# Patient Record
Sex: Female | Born: 1955
Health system: Southern US, Community
[De-identification: ages and names within clinical notes are randomized; demographics above are authoritative.]

## PROBLEM LIST (undated history)

## (undated) DIAGNOSIS — L509 Urticaria, unspecified: Secondary | ICD-10-CM

## (undated) DIAGNOSIS — E119 Type 2 diabetes mellitus without complications: Secondary | ICD-10-CM

## (undated) DIAGNOSIS — K5792 Diverticulitis of intestine, part unspecified, without perforation or abscess without bleeding: Secondary | ICD-10-CM

## (undated) DIAGNOSIS — I10 Essential (primary) hypertension: Secondary | ICD-10-CM

## (undated) DIAGNOSIS — M1990 Unspecified osteoarthritis, unspecified site: Secondary | ICD-10-CM

## (undated) DIAGNOSIS — T7840XA Allergy, unspecified, initial encounter: Secondary | ICD-10-CM

## (undated) HISTORY — PX: CHOLECYSTECTOMY: SHX55

## (undated) HISTORY — DX: Diverticulitis of intestine, part unspecified, without perforation or abscess without bleeding: K57.92

## (undated) HISTORY — PX: APPENDECTOMY: SHX54

## (undated) HISTORY — DX: Urticaria, unspecified: L50.9

## (undated) HISTORY — DX: Allergy, unspecified, initial encounter: T78.40XA

## (undated) HISTORY — DX: Essential (primary) hypertension: I10

## (undated) HISTORY — PX: TUBAL LIGATION: SHX77

## (undated) HISTORY — DX: Type 2 diabetes mellitus without complications: E11.9

## (undated) HISTORY — DX: Unspecified osteoarthritis, unspecified site: M19.90

---

## 2001-02-24 ENCOUNTER — Other Ambulatory Visit: Admission: RE | Admit: 2001-02-24 | Discharge: 2001-02-24 | Payer: Self-pay | Admitting: Obstetrics and Gynecology

## 2001-03-28 ENCOUNTER — Ambulatory Visit (HOSPITAL_COMMUNITY): Admission: RE | Admit: 2001-03-28 | Discharge: 2001-03-28 | Payer: Self-pay | Admitting: Obstetrics and Gynecology

## 2011-10-19 ENCOUNTER — Encounter: Payer: Self-pay | Admitting: Family Medicine

## 2011-10-19 ENCOUNTER — Ambulatory Visit (INDEPENDENT_AMBULATORY_CARE_PROVIDER_SITE_OTHER): Payer: BC Managed Care – PPO | Admitting: Family Medicine

## 2011-10-19 DIAGNOSIS — I10 Essential (primary) hypertension: Secondary | ICD-10-CM

## 2011-10-19 DIAGNOSIS — E1159 Type 2 diabetes mellitus with other circulatory complications: Secondary | ICD-10-CM | POA: Insufficient documentation

## 2011-11-30 ENCOUNTER — Ambulatory Visit: Payer: BC Managed Care – PPO | Admitting: Family Medicine

## 2012-02-29 ENCOUNTER — Ambulatory Visit (INDEPENDENT_AMBULATORY_CARE_PROVIDER_SITE_OTHER): Payer: BC Managed Care – PPO | Admitting: Emergency Medicine

## 2012-02-29 VITALS — BP 150/88 | HR 83 | Temp 99.1°F | Resp 18 | Ht 65.0 in | Wt 246.0 lb

## 2012-02-29 DIAGNOSIS — K047 Periapical abscess without sinus: Secondary | ICD-10-CM

## 2012-02-29 MED ORDER — PENICILLIN V POTASSIUM 500 MG PO TABS: 500.0000 mg | ORAL_TABLET | Freq: Four times a day (QID) | ORAL | Status: AC

## 2012-02-29 MED ORDER — CLINDAMYCIN HCL 150 MG PO CAPS: 150.0000 mg | ORAL_CAPSULE | Freq: Three times a day (TID) | ORAL | Status: AC

## 2012-02-29 NOTE — Progress Notes (Signed)
  Subjective:    Patient ID: Katherine [name], female    DOB: 06/21/1956, 56 y.o.   MRN: [id number]  Hypertension  Dental Injury  This is a chronic problem. The current episode started yesterday. The problem occurs constantly. The problem has been gradually worsening. The pain is at a severity of 4/10. The pain is mild. Associated symptoms include thermal sensitivity. Pertinent negatives include no difficulty swallowing, facial pain, fever, oral bleeding or sinus pressure. She has tried acetaminophen for the symptoms. The treatment provided no relief.      Review of Systems  Constitutional: Negative.  Negative for fever.  HENT: Negative.  Negative for sinus pressure.   Eyes: Negative.   Cardiovascular: Negative.   Gastrointestinal: Negative.   Musculoskeletal: Negative.   Neurological: Negative.        Objective:   Physical Exam  Constitutional: She is oriented to person, place, and time. She appears well-developed and well-nourished.  HENT:  Head: Normocephalic and atraumatic.  Right Ear: External ear normal.  Left Ear: External ear normal.  Mouth/Throat: Dental abscesses and dental caries present.  Cardiovascular: Normal rate, regular rhythm and normal heart sounds.   Abdominal: Soft.  Musculoskeletal: Normal range of motion.  Neurological: She is alert and oriented to person, place, and time.          Assessment & Plan:  Extensive severe carious disease with periapical abscess right maxillary molars

## 2012-02-29 NOTE — Patient Instructions (Signed)
Dental Abscess A dental abscess usually starts from an infected tooth. Antibiotic medicine and pain pills can be helpful, but dental infections require the attention of a dentist. Rinse around the infected area often with salt water (a pinch of salt in 8 oz of warm water). Do not apply heat to the outside of your face. See your dentist or oral surgeon as soon as possible.  SEEK IMMEDIATE MEDICAL CARE IF:  You have increasing, severe pain that is not relieved by medicine.   You or your child has an oral temperature above 102 F (38.9 C), not controlled by medicine.   Your baby is older than 3 months with a rectal temperature of 102 F (38.9 C) or higher.   Your baby is 3 months old or younger with a rectal temperature of 100.4 F (38 C) or higher.   You develop chills, severe headache, difficulty breathing, or trouble swallowing.   You have swelling in the neck or around the eye.  Document Released: 09/14/2005 Document Revised: 09/03/2011 Document Reviewed: 02/23/2007 Exit[employer] Patient Information 2012 [employer]. 

## 2012-03-11 ENCOUNTER — Other Ambulatory Visit: Payer: Self-pay | Admitting: Emergency Medicine

## 2012-06-07 ENCOUNTER — Other Ambulatory Visit: Payer: Self-pay

## 2012-06-07 MED ORDER — LOSARTAN POTASSIUM-HCTZ 50-12.5 MG PO TABS: 1.0000 | ORAL_TABLET | Freq: Every day | ORAL | Status: DC

## 2012-09-22 ENCOUNTER — Telehealth: Payer: Self-pay

## 2012-09-22 MED ORDER — LOSARTAN POTASSIUM-HCTZ 50-12.5 MG PO TABS: 1.0000 | ORAL_TABLET | Freq: Every day | ORAL | Status: DC

## 2012-09-22 NOTE — Telephone Encounter (Signed)
Patient is due for office visit. She is in [place] and is close to a Statistician [facility] [address])_I have put this pharmacy in please advise if we need to send in a few days of her meds. Latishia Suitt

## 2012-09-22 NOTE — Telephone Encounter (Signed)
I have sent in #[phone] but she will need an office visit for any additional refills

## 2012-09-22 NOTE — Telephone Encounter (Signed)
She is advised. She called back,she states the pills look different, she is told we sent in correct Rx (but to call the pharmacy to make sure the correct Rx was dispensed) I pulled paper chart and this is what she was on previously also.

## 2012-09-22 NOTE — Telephone Encounter (Signed)
PT IS OUT OF TOWN AND LEFT HER BP MEDICINE AT HOME.  SHE HAS BEEN TWO DAYS WITHOUT IT AND IT WILL BE TWO MORE DAYS BEFORE SHE GETS BACK HOME TO TAKE IT.  SHE TAKES LOSARTAN-HYDROCHLOROTHIAZIDE.  WILL SHE BE OK WITHOUT THIS FOR THAT PERIOD OF TIME OR SHOULD WE CALL HER IN A FEW UNTIL SHE GETS BACK?  [phone]

## 2012-10-11 ENCOUNTER — Ambulatory Visit (INDEPENDENT_AMBULATORY_CARE_PROVIDER_SITE_OTHER): Payer: BC Managed Care – PPO | Admitting: Family Medicine

## 2012-10-11 VITALS — BP 134/87 | HR 74 | Temp 98.1°F | Resp 18 | Ht 65.0 in | Wt 248.2 lb

## 2012-10-11 DIAGNOSIS — I10 Essential (primary) hypertension: Secondary | ICD-10-CM

## 2012-10-11 MED ORDER — LOSARTAN POTASSIUM-HCTZ 50-12.5 MG PO TABS: 1.0000 | ORAL_TABLET | Freq: Every day | ORAL | Status: DC

## 2012-10-11 NOTE — Progress Notes (Signed)
Katherine [name] is a 57 y.o. female who presents to [facility] today for blood pressure medication refill.  Patient is doing well on losartan/hydrochlorothiazide. She denies any chest pains palpitations fevers or chills.  She denies any cough, or tongue or lip swelling.  She has not yet established with a new primary care provider since Dr. Hal Hope left.  She would like to and set up a well woman exam soon.     PMH: Reviewed hypertension History  Substance Use Topics  . Smoking status: Never Smoker   . Smokeless tobacco: Never Used  . Alcohol Use: Yes     Comment: social   ACE cough ROS as above  Medications reviewed. Current Outpatient Prescriptions  Medication Sig Dispense Refill  . losartan-hydrochlorothiazide (HYZAAR) 50-12.5 MG per tablet Take 1 tablet by mouth daily.  90 tablet  2    Exam:  BP 134/87  Pulse 74  Temp 98.1 F (36.7 C) (Oral)  Resp 18  Ht 5\' 5"  (1.651 m)  Wt 248 lb 3.2 oz (112.583 kg)  BMI 41.30 kg/m2  SpO2 98% Gen: Well NAD HEENT: EOMI,  MMM Lungs: CTABL Nl WOB Heart: RRR no MRG Abd: NABS, NT, ND Exts: Non edematous BL  LE, warm and well perfused.   No results found for this or any previous visit (from the past 72 hour(s)).  Assessment and Plan: 57 y.o. female with well-controlled hypertension.  Plan: Refill losartan and hydrochlorothiazide Check CMP and direct LDL. Patient will followup with Dr. Clelia Croft in a few months for a well woman exam to include health maintenance evaluation at that time.  Discussed warning signs or symptoms. Please see discharge instructions. Patient expresses understanding.

## 2012-10-11 NOTE — Patient Instructions (Addendum)
Thank you for coming in today. We will refill the blood pressure medicine.  We will get labs today.  Make a follow up appointment with Dr. Clelia Croft for a well woman exam soonish.  Call or go to the emergency room if you get worse, have trouble breathing, have chest pains, or palpitations.

## 2012-10-12 LAB — COMPLETE METABOLIC PANEL WITH GFR
ALT: 17 U/L (ref 0–35)
AST: 14 U/L (ref 0–37)
Alkaline Phosphatase: 85 U/L (ref 39–117)
CO2: 30 mEq/L (ref 19–32)
Creat: 0.92 mg/dL (ref 0.50–1.10)
Sodium: 139 mEq/L (ref 135–145)
Total Bilirubin: 0.5 mg/dL (ref 0.3–1.2)
Total Protein: 7.6 g/dL (ref 6.0–8.3)

## 2012-10-24 ENCOUNTER — Ambulatory Visit (INDEPENDENT_AMBULATORY_CARE_PROVIDER_SITE_OTHER): Payer: BC Managed Care – PPO | Admitting: Family Medicine

## 2012-10-24 VITALS — BP 133/83 | HR 80 | Temp 98.2°F | Resp 16 | Ht 65.5 in | Wt 245.0 lb

## 2012-10-24 DIAGNOSIS — H0289 Other specified disorders of eyelid: Secondary | ICD-10-CM

## 2012-10-24 DIAGNOSIS — H0259 Other disorders affecting eyelid function: Secondary | ICD-10-CM

## 2012-10-24 MED ORDER — ERYTHROMYCIN 5 MG/GM OP OINT: TOPICAL_OINTMENT | Freq: Four times a day (QID) | OPHTHALMIC | Status: DC

## 2012-10-24 NOTE — Progress Notes (Signed)
[facility]:  Office Visit  Chief Complaint:  Chief Complaint  Patient presents with  . Stye    left eye    HPI: Katherine [name] is a 57 y.o. female who complains of  6 day history of gradual eye swelling,redness. Denies  allergies, no pain, photophobia, vision changes. Has not tried anything for it. She is a Engineer, site. Does not itch. Has tried to avoid scratching or touching it. No recent URI sxs. No other contacts with similar sxs.    Past Medical History  Diagnosis Date  . Hypertension    Past Surgical History  Procedure Date  . Cholecystectomy    History   Social History  . Marital Status: Married    Spouse Name: N/A    Number of Children: N/A  . Years of Education: N/A   Social History Main Topics  . Smoking status: Never Smoker   . Smokeless tobacco: Never Used  . Alcohol Use: Yes     Comment: social  . Drug Use: No  . Sexually Active: None   Other Topics Concern  . None   Social History Narrative  . None   No family history on file. Allergies  Allergen Reactions  . Ace Inhibitors Cough  . Adhesive (Tape) Rash   Prior to Admission medications   Medication Sig Start Date End Date Taking? Authorizing Provider  losartan-hydrochlorothiazide (HYZAAR) 50-12.5 MG per tablet Take 1 tablet by mouth daily. 10/11/12  Yes Rodolph Bong, MD  phentermine 15 MG capsule Take 15 mg by mouth every morning.   Yes Historical Provider, MD  UNABLE TO FIND HCG: 1 /weekly   Yes Historical Provider, MD  UNABLE TO FIND B 12 injections weekly   Yes Historical Provider, MD     ROS: The patient denies fevers, chills, night sweats, unintentional weight loss, chest pain, palpitations, wheezing, dyspnea on exertion, nausea, vomiting, abdominal pain, dysuria, hematuria, melena, numbness, weakness, or tingling.   All other systems have been reviewed and were otherwise negative with the exception of those mentioned in the HPI and as above.    PHYSICAL  EXAM: Filed Vitals:   10/24/12 1145  BP: 133/83  Pulse: 80  Temp: 98.2 F (36.8 C)  Resp: 16   Filed Vitals:   10/24/12 1145  Height: 5' 5.5" (1.664 m)  Weight: 245 lb (111.131 kg)   Body mass index is 40.15 kg/(m^2).  General: Alert, no acute distress HEENT:  Normocephalic, atraumatic, oropharynx patent. EOMI, PERRLA, fundoscpic exam nl. No appreciable stye/chalazion. + upper lid tenderness, swelling, redness, + abrasion on inner mucosa, upper lid. No erythema of conjunctiva.  Cardiovascular:  Regular rate and rhythm, no rubs murmurs or gallops.  No Carotid bruits, radial pulse intact. No pedal edema.  Respiratory: Clear to auscultation bilaterally.  No wheezes, rales, or rhonchi.  No cyanosis, no use of accessory musculature GI: No organomegaly, abdomen is soft and non-tender, positive bowel sounds.  No masses. Skin: No rashes. Neurologic: Facial musculature symmetric. Psychiatric: Patient is appropriate throughout our interaction. Lymphatic: No cervical lymphadenopathy Musculoskeletal: Gait intact.   LABS:    EKG/XRAY:   Primary read interpreted by Dr. Conley Rolls at [facility].   ASSESSMENT/PLAN: Encounter Diagnosis  Name Primary?  . Superficial swelling of eyelid Yes   Superficial eye lid swelling, there is small abrasion on lateral inner mucosa of upper left lid. There is no e/o stye or chalazion at this time.  Vision is good Erythromycin ointment in left eye and  on left eyelid Warm compresses Monitor for worsening s/sx infection F/u prn     Timothy Trudell PHUONG, DO 10/24/2012 12:33 PM

## 2012-12-07 ENCOUNTER — Ambulatory Visit: Payer: Self-pay

## 2012-12-07 ENCOUNTER — Other Ambulatory Visit: Payer: Self-pay | Admitting: Occupational Medicine

## 2012-12-07 DIAGNOSIS — R52 Pain, unspecified: Secondary | ICD-10-CM

## 2012-12-30 ENCOUNTER — Encounter: Payer: Self-pay | Admitting: Family Medicine

## 2012-12-30 ENCOUNTER — Other Ambulatory Visit: Payer: Self-pay | Admitting: Family Medicine

## 2012-12-30 ENCOUNTER — Ambulatory Visit (INDEPENDENT_AMBULATORY_CARE_PROVIDER_SITE_OTHER): Payer: BC Managed Care – PPO | Admitting: Family Medicine

## 2012-12-30 VITALS — BP 133/77 | HR 75 | Temp 97.5°F | Resp 18 | Ht 65.5 in | Wt 232.0 lb

## 2012-12-30 DIAGNOSIS — I10 Essential (primary) hypertension: Secondary | ICD-10-CM

## 2012-12-30 DIAGNOSIS — Z1231 Encounter for screening mammogram for malignant neoplasm of breast: Secondary | ICD-10-CM

## 2012-12-30 DIAGNOSIS — Z0000 Encounter for general adult medical examination without abnormal findings: Secondary | ICD-10-CM

## 2012-12-30 LAB — COMPREHENSIVE METABOLIC PANEL
BUN: 12 mg/dL (ref 6–23)
CO2: 26 mEq/L (ref 19–32)
Creat: 0.82 mg/dL (ref 0.50–1.10)
Glucose, Bld: 109 mg/dL — ABNORMAL HIGH (ref 70–99)
Total Bilirubin: 0.6 mg/dL (ref 0.3–1.2)
Total Protein: 7.4 g/dL (ref 6.0–8.3)

## 2012-12-30 LAB — CBC WITH DIFFERENTIAL/PLATELET
Basophils Relative: 1 % (ref 0–1)
Eosinophils Absolute: 0.1 10*3/uL (ref 0.0–0.7)
Eosinophils Relative: 3 % (ref 0–5)
MCH: 28.6 pg (ref 26.0–34.0)
MCHC: 35.3 g/dL (ref 30.0–36.0)
MCV: 81.2 fL (ref 78.0–100.0)
Monocytes Relative: 6 % (ref 3–12)
Neutrophils Relative %: 35 % — ABNORMAL LOW (ref 43–77)
Platelets: 247 10*3/uL (ref 150–400)

## 2012-12-30 LAB — LIPID PANEL
Cholesterol: 162 mg/dL (ref 0–200)
HDL: 44 mg/dL (ref >39)
Total CHOL/HDL Ratio: 3.7 Ratio
Triglycerides: 83 mg/dL (ref <150)

## 2012-12-30 LAB — HEPATITIS C ANTIBODY: HCV Ab: NEGATIVE

## 2012-12-30 MED ORDER — LOSARTAN POTASSIUM-HCTZ 100-12.5 MG PO TABS: 1.0000 | ORAL_TABLET | Freq: Every day | ORAL | Status: DC

## 2012-12-30 NOTE — Progress Notes (Signed)
Subjective:    Patient ID: Katherine [name], female    DOB: [birth year], 57 y.o.   MRN: [id number] Chief Complaint  Patient presents with  . Annual Exam  . Gynecologic Exam    HPI  Saw dr. Loreta Ave 5 yrs ago for her colonsocopy - thinks she has some polyps but doesn't remember but has written down in her calender that she is due for a repeat colonoscopy this June.  She will call to sched an appt.   Taking omega-3, fish oil, mvi, vit C, b12, asa, and weight loss supp garcinia cambogia.  Is working very hard on diet and exercise - doing the eliptical regluarly. Has lost 15 lbs in the past 5 mos and decreased bmi from 41 to 38!  Taking tramadol for her shoulder when she hurt her arm at work last wk - she may need to go to PT through workers comp.  Does not remember when last pap was - [facility]  She did have an abnormal one many years ago and all they had to do was repeat it. No colposcopy.  Is married and still sexually active but not often due to schedules and decreased libido.  Checking BP outside the office and running 137-141/77-83 at home.  TDaP was done 2012  Past Medical History  Diagnosis Date  . Hypertension    Current Outpatient Prescriptions on File Prior to Visit  Medication Sig Dispense Refill  . erythromycin ophthalmic ointment Place into the left eye every 6 (six) hours.  3.5 g  0   No current facility-administered medications on file prior to visit.   Allergies  Allergen Reactions  . Ace Inhibitors Cough  . Adhesive (Tape) Rash   Past Surgical History  Procedure Laterality Date  . Cholecystectomy     Family History  Problem Relation Age of Onset  . Diabetes Father   . Hypertension Father   . Sickle cell anemia Brother   . Cancer Maternal Grandmother     breast cancer  . Cancer Brother    History   Social History  . Marital Status: Married    Spouse Name: N/A    Number of Children: N/A  . Years of Education: Masters     Occupational History  . Teacher    Social History Main Topics  . Smoking status: Never Smoker   . Smokeless tobacco: Never Used  . Alcohol Use: Yes     Comment: social  . Drug Use: No  . Sexually Active: None   Other Topics Concern  . None   Social History Narrative   Patient has had 2 pregnancies and has 2 living children.  Her son is 65 yo - he went to [employer], maintenance ([employer]) school and is a Investment banker, operational at [employer] but he is into music as well - rap - and wants to go to [place] to try that.  Her daughter is 16 - she is a Tree surgeon for the [employer] and works from home. Her daughter lives in [place] with 76 yo and 4 mo sons.  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  Review of Systems  All other systems reviewed and are negative.      BP 133/77  Pulse 75  Temp(Src) 97.5 F (36.4 C)  Resp 18  Ht 5' 5.5" (1.664 m)  Wt 232 lb (105.235 kg)  BMI 38.01 kg/m2 Objective:   Physical Exam  Constitutional: She is oriented to person, place, and time. She appears well-developed and well-nourished. No distress.  HENT:  Head: Normocephalic and atraumatic.  Right Ear: Tympanic membrane, external ear and ear canal normal.  Left Ear: Tympanic membrane, external ear and ear canal normal.  Nose: Mucosal edema present. No rhinorrhea.  Mouth/Throat: Uvula is midline, oropharynx is clear and moist and mucous membranes are normal. No posterior oropharyngeal erythema.  Eyes: Conjunctivae and EOM are normal. Pupils are equal, round, and reactive to light. Right eye exhibits no discharge. Left eye exhibits no discharge. No scleral icterus.  Neck: Normal range of motion. Neck supple. No thyromegaly present.  Cardiovascular: Normal rate, regular rhythm, normal heart sounds and intact distal pulses.   Pulmonary/Chest: Effort normal and breath sounds normal. No respiratory distress.  Abdominal: Soft. Bowel sounds are normal.  There is no tenderness.  Genitourinary: Vagina normal and uterus normal. No breast swelling, tenderness, discharge or bleeding. Cervix exhibits no motion tenderness and no friability. Right adnexum displays no mass and no tenderness. Left adnexum displays no mass and no tenderness.  Musculoskeletal: She exhibits no edema.  Lymphadenopathy:    She has no cervical adenopathy.  Neurological: She is alert and oriented to person, place, and time. She has normal reflexes.  Skin: Skin is warm and dry. She is not diaphoretic. No erythema.  Psychiatric: She has a normal mood and affect. Her behavior is normal.      Assessment & Plan:  Routine general medical examination at a health care facility - Plan: MM Digital Screening, TSH, CBC with Differential, HgB A1c, Lipid panel, Comprehensive metabolic panel, Vitamin D 25 hydroxy, Hepatitis C antibody, Pap IG and HPV (high risk) DNA detection - see pt instructions  Hypertension - BP still slightly above goal. Hopefully it will come down with the tlc efforts pt is making but will increase hyzaar from 50/12.5 to 100/12.5.  Recheck in 6 mos.  Severe obesity (BMI >= 40) - will remove from diagnosis list as BMI is down to 38 - keep up the great work!

## 2012-12-30 NOTE — Patient Instructions (Addendum)
Make sure you are getting in plenty of calcium and vitamin D. We want you to get in calcium 500-600mg  twice a day and about 800u of vit D once a day. Look what is in your multivitamin and then buy an additional supplement to round this out that you take at a different time. Consider adding weights to your exercise regimen to help keep your bones strong and help you loose further weight. Congratulations on your weight loss - very impressive. You have actually moved yourself out of the "morbidly obese" diagnosis which is awesome!  Keep up the good work. Remember to call Dr. Loreta Ave and see if you need to schedule your colonoscopy for this yr.  Keeping You Healthy  Get These Tests  Blood Pressure- Have your blood pressure checked by your healthcare provider at least once a year.  Normal blood pressure is 120/80.  Weight- Have your body mass index (BMI) calculated to screen for obesity.  BMI is a measure of body fat based on height and weight.  You can calculate your own BMI at https://www.west-esparza.com/  Cholesterol- Have your cholesterol checked every year.  Diabetes- Have your blood sugar checked every year if you have high blood pressure, high cholesterol, a family history of diabetes or if you are overweight.  Pap Smear- Have a pap smear every 1 to 3 years if you have been sexually active.  If you are older than 65 and recent pap smears have been normal you may not need additional pap smears.  In addition, if you have had a hysterectomy  For benign disease additional pap smears are not necessary.  Mammogram-Yearly mammograms are essential for early detection of breast cancer  Screening for Colon Cancer- Colonoscopy starting at age 59. Screening may begin sooner depending on your family history and other health conditions.  Follow up colonoscopy as directed by your Gastroenterologist.  Screening for Osteoporosis- Screening begins at age 59 with bone density scanning, sooner if you are at higher  risk for developing Osteoporosis.  Get these medicines  Calcium with Vitamin D- Your body requires 5900-1500 mg of Calcium a day and [phone] IU of Vitamin D a day.  You can only absorb 500 mg of Calcium at a time therefore Calcium must be taken in 2 or 3 separate doses throughout the day.  Hormones- Hormone therapy has been associated with increased risk for certain cancers and heart disease.  Talk to your healthcare provider about if you need relief from menopausal symptoms.  Aspirin- Ask your healthcare provider about taking Aspirin to prevent Heart Disease and Stroke.  Get these Immuniztions  Flu shot- Every fall  Pneumonia shot- Once after the age of 59; if you are younger ask your healthcare provider if you need a pneumonia shot.  Tetanus- Every ten years.  Zostavax- Once after the age of 59 to prevent shingles.  Take these steps  Don't smoke- Your healthcare provider can help you quit. For tips on how to quit, ask your healthcare provider or go to www.smokefree.gov or call 1-800 QUIT-NOW.  Be physically active- Exercise 5 days a week for a minimum of 30 minutes.  If you are not already physically active, start slow and gradually work up to 30 minutes of moderate physical activity.  Try walking, dancing, bike riding, swimming, etc.  Eat a healthy diet- Eat a variety of healthy foods such as fruits, vegetables, whole grains, low fat milk, low fat cheeses, yogurt, lean meats, chicken, fish, eggs, dried beans, tofu, etc.  For more information go to www.thenutritionsource.org  Dental visit- Brush and floss teeth twice daily; visit your dentist twice a year.  Eye exam- Visit your Optometrist or Ophthalmologist yearly.  Drink alcohol in moderation- Limit alcohol intake to one drink or less a day.  Never drink and drive.  Depression- Your emotional health is as important as your physical health.  If you're feeling down or losing interest in things you normally enjoy, please talk to  your healthcare provider.  Seat Belts- can save your life; always wear one  Smoke/Carbon Monoxide detectors- These detectors need to be installed on the appropriate level of your home.  Replace batteries at least once a year.  Violence- If anyone is threatening or hurting you, please tell your healthcare provider.  Living Will/ Health care power of attorney- Discuss with your healthcare provider and family.  Exercise to Lose Weight Exercise and a healthy diet may help you lose weight. Your doctor may suggest specific exercises. EXERCISE IDEAS AND TIPS  Choose low-cost things you enjoy doing, such as walking, bicycling, or exercising to workout videos.  Take stairs instead of the elevator.  Walk during your lunch break.  Park your car further away from work or school.  Go to a gym or an exercise class.  Start with 5 to 10 minutes of exercise each day. Build up to 30 minutes of exercise 4 to 6 days a week.  Wear shoes with good support and comfortable clothes.  Stretch before and after working out.  Work out until you breathe harder and your heart beats faster.  Drink extra water when you exercise.  Do not do so much that you hurt yourself, feel dizzy, or get very short of breath. Exercises that burn about 150 calories:  Running 1  miles in 15 minutes.  Playing volleyball for 45 to 60 minutes.  Washing and waxing a car for 45 to 60 minutes.  Playing touch football for 45 minutes.  Walking 1  miles in 35 minutes.  Pushing a stroller 1  miles in 30 minutes.  Playing basketball for 30 minutes.  Raking leaves for 30 minutes.  Bicycling 5 miles in 30 minutes.  Walking 2 miles in 30 minutes.  Dancing for 30 minutes.  Shoveling snow for 15 minutes.  Swimming laps for 20 minutes.  Walking up stairs for 15 minutes.  Bicycling 4 miles in 15 minutes.  Gardening for 30 to 45 minutes.  Jumping rope for 15 minutes.  Washing windows or floors for 45 to 60  minutes. Document Released: 10/17/2010 Document Revised: 12/07/2011 Document Reviewed: 10/17/2010 [facility] Patient Information 2013 [place], [place]. Calorie Counting Diet A calorie counting diet requires you to eat the number of calories that are right for you in a day. Calories are the measurement of how much energy you get from the food you eat. Eating the right amount of calories is important for staying at a healthy weight. If you eat too many calories, your body will store them as fat and you may gain weight. If you eat too few calories, you may lose weight. Counting the number of calories you eat during a day will help you know if you are eating the right amount. A Registered Dietitian can determine how many calories you need in a day. The amount of calories needed varies from person to person. If your goal is to lose weight, you will need to eat fewer calories. Losing weight can benefit you if you are overweight or have health  problems such as heart disease, high blood pressure, or diabetes. If your goal is to gain weight, you will need to eat more calories. Gaining weight may be necessary if you have a certain health problem that causes your body to need more energy. TIPS Whether you are increasing or decreasing the number of calories you eat during a day, it may be hard to get used to changes in what you eat and drink. The following are tips to help you keep track of the number of calories you eat.  Measure foods at home with measuring cups. This helps you know the amount of food and number of calories you are eating.  Restaurants often serve food in amounts that are larger than 1 serving. While eating out, estimate how many servings of a food you are given. For example, a serving of cooked rice is  cup or about the size of half of a fist. Knowing serving sizes will help you be aware of how much food you are eating at restaurants.  Ask for smaller portion sizes or child-size portions at  restaurants.  Plan to eat half of a meal at a restaurant. Take the rest home or share the other half with a friend.  Read the Nutrition Facts panel on food labels for calorie content and serving size. You can find out how many servings are in a package, the size of a serving, and the number of calories each serving has.  For example, a package might contain 3 cookies. The Nutrition Facts panel on that package says that 1 serving is 1 cookie. Below that, it will say there are 3 servings in the container. The calories section of the Nutrition Facts label says there are 90 calories. This means there are 90 calories in 1 cookie (1 serving). If you eat 1 cookie you have eaten 90 calories. If you eat all 3 cookies, you have eaten 270 calories (3 servings x 90 calories = 270 calories). The list below tells you how big or small some common portion sizes are.  1 oz.........4 stacked dice.  3 oz........Marland KitchenDeck of cards.  1 tsp.......Marland KitchenTip of little finger.  1 tbs......Marland KitchenMarland KitchenThumb.  2 tbs.......Marland KitchenGolf ball.   cup......Marland KitchenHalf of a fist.  1 cup.......Marland KitchenA fist. KEEP A FOOD LOG Write down every food item you eat, the amount you eat, and the number of calories in each food you eat during the day. At the end of the day, you can add up the total number of calories you have eaten. It may help to keep a list like the one below. Find out the calorie information by reading the Nutrition Facts panel on food labels. Breakfast  Bran cereal (1 cup, 110 calories).  Fat-free milk ( cup, 45 calories). Snack  Apple (1 medium, 80 calories). Lunch  Spinach (1 cup, 20 calories).  Tomato ( medium, 20 calories).  Chicken breast strips (3 oz, 165 calories).  Shredded cheddar cheese ( cup, 110 calories).  Light [place] dressing (2 tbs, 60 calories).  Whole-wheat bread (1 slice, 80 calories).  Tub margarine (1 tsp, 35 calories).  Vegetable soup (1 cup, 160 calories). Dinner  Pork chop (3 oz, 190  calories).  Brown rice (1 cup, 215 calories).  Steamed broccoli ( cup, 20 calories).  Strawberries (1  cup, 65 calories).  Whipped cream (1 tbs, 50 calories). Daily Calorie Total: 1425 Document Released: 09/14/2005 Document Revised: 12/07/2011 Document Reviewed: 03/11/2007 [facility] Patient Information 2013 [place], [place].

## 2012-12-31 LAB — VITAMIN D 25 HYDROXY (VIT D DEFICIENCY, FRACTURES): Vit D, 25-Hydroxy: 41 ng/mL (ref 30–89)

## 2013-01-03 LAB — PAP IG AND HPV HIGH-RISK: HPV DNA High Risk: NOT DETECTED

## 2013-01-03 LAB — HEMOGLOBIN A1C: Mean Plasma Glucose: 123 mg/dL — ABNORMAL HIGH (ref <117)

## 2013-01-06 ENCOUNTER — Encounter: Payer: Self-pay | Admitting: Sports Medicine

## 2013-01-16 ENCOUNTER — Encounter: Payer: Self-pay | Admitting: *Deleted

## 2013-01-16 ENCOUNTER — Encounter: Payer: Worker's Compensation | Admitting: Sports Medicine

## 2013-01-27 ENCOUNTER — Ambulatory Visit: Payer: Worker's Compensation

## 2013-01-27 ENCOUNTER — Ambulatory Visit (INDEPENDENT_AMBULATORY_CARE_PROVIDER_SITE_OTHER): Payer: Worker's Compensation | Admitting: Family Medicine

## 2013-01-27 VITALS — BP 128/80 | Ht 65.0 in | Wt 220.0 lb

## 2013-01-27 DIAGNOSIS — M25519 Pain in unspecified shoulder: Secondary | ICD-10-CM

## 2013-01-27 DIAGNOSIS — M75101 Unspecified rotator cuff tear or rupture of right shoulder, not specified as traumatic: Secondary | ICD-10-CM

## 2013-01-27 DIAGNOSIS — M75100 Unspecified rotator cuff tear or rupture of unspecified shoulder, not specified as traumatic: Secondary | ICD-10-CM | POA: Insufficient documentation

## 2013-01-27 DIAGNOSIS — M25511 Pain in right shoulder: Secondary | ICD-10-CM

## 2013-01-27 DIAGNOSIS — S43429A Sprain of unspecified rotator cuff capsule, initial encounter: Secondary | ICD-10-CM

## 2013-01-27 NOTE — Progress Notes (Signed)
  Subjective:    Patient ID: Katherine [name], female    DOB: 03-Aug-1956, 57 y.o.   MRN: [id number]  HPI Worker's compensation number [id number] Date of injury December 01, 2012. Patient was at work walking down a flight of stairs with her right hand on the banister. She started to make the turn for the second flight of stairs her foot slipped on the stair and she almost fell, grabbing the banister with her right hand to steady herself. She felt an immediate sharp pain in her right shoulder. Since then she's had continued pain, particularly at night and with certain motions. Overhead reaching or trying to pick up something her most painful. She is right-hand dominant. She's had no numbness or tingling in her hands PERTINENT  PMH / PSH: No prior shoulder injury, no history of shoulder surgeries, no neck surgeries. ALLERGY: Pertinent is that she is allergic to most adhesives, Band-Aids in that kind of tape.  Review of Systems See history of present illness. Additionally she has had no redness or warmth of the right shoulder noted.    Objective:   Physical Exam Vital signs are reviewed GENERAL: Well-developed female no acute distress SHOULDERS: Bilaterally they're symmetrical without any droop. RIGHT shoulder. Full passive range of motion. Active range of motion is limited to elevation in the forward and lateral planes at about 90. She has weakness with supraspinatus and subscapularis testing. Distally she is neurovascularly intact. The deltoid is nontender. The bicipital tendon is nontender. The biceps is without defect. ; Small intrasubstance tear x2 in the supraspinatus tendon. The subscapularis tendon reveals a small articular surface tear. Neither of these is full-thickness. The biceps tendon appears intact but there is fluid in the sheath consistent with bicipital tendinitis.    INJECTION: Patient was given informed consent, signed copy in the chart. Appropriate time out was taken. Area  prepped and draped in usual sterile fashion. One cc of methylprednisolone 40 mg/ml plus  4 cc of 1% lidocaine without epinephrine was injected into the right subacromial bursa using a(n) posterior approach. The patient tolerated the procedure well. There were no complications. Post procedure instructions were given.      Assessment & Plan:  Rotator cuff tear, not full-thickness. Both supraspinatus and subscapularis are involved. We discussed options. I think the nitroglycerin patches Option for her given her adhesive allergies. Today we decided to go ahead and see if we can improve her pain with a corticosteroid injection. She will start an aggressive home rehabilitation program and I will see her back in 4 weeks.

## 2013-01-30 ENCOUNTER — Ambulatory Visit: Payer: Worker's Compensation

## 2013-01-30 ENCOUNTER — Ambulatory Visit
Admission: RE | Admit: 2013-01-30 | Discharge: 2013-01-30 | Disposition: A | Discharge status: Home / Self Care | Payer: Self-pay | Source: Ambulatory Visit | Attending: Family Medicine | Admitting: Family Medicine

## 2013-01-30 DIAGNOSIS — Z1231 Encounter for screening mammogram for malignant neoplasm of breast: Secondary | ICD-10-CM

## 2013-02-10 ENCOUNTER — Ambulatory Visit: Payer: Worker's Compensation

## 2013-02-27 ENCOUNTER — Encounter: Payer: Worker's Compensation | Admitting: Family Medicine

## 2013-03-06 ENCOUNTER — Encounter: Payer: Self-pay | Admitting: Family Medicine

## 2013-03-06 ENCOUNTER — Ambulatory Visit (INDEPENDENT_AMBULATORY_CARE_PROVIDER_SITE_OTHER): Payer: Worker's Compensation | Admitting: Family Medicine

## 2013-03-06 VITALS — BP 126/85 | HR 88 | Ht 65.0 in | Wt 220.0 lb

## 2013-03-06 DIAGNOSIS — M75101 Unspecified rotator cuff tear or rupture of right shoulder, not specified as traumatic: Secondary | ICD-10-CM

## 2013-03-06 DIAGNOSIS — S43429A Sprain of unspecified rotator cuff capsule, initial encounter: Secondary | ICD-10-CM

## 2013-03-06 MED ORDER — TRAMADOL HCL 50 MG PO TABS: 50.0000 mg | ORAL_TABLET | Freq: Three times a day (TID) | ORAL | Status: DC | PRN

## 2013-03-07 NOTE — Progress Notes (Signed)
  Subjective:    Patient ID: Katherine [name], female    DOB: 20-May-1956, 57 y.o.   MRN: [id number]  HPI Worker's compensation number [id number]  Date of injury December 01, 2012.  Patient has now retired but this is continuing workers compensation case  Followup right shoulder injury. She has been doing the home exercise program daily and has seen about 10% improvement in her range of motion and in her pain. Pain is still most problematic at night causing difficulty with initiating sleep. She is intermittently used over-the-counter medications. They have given her some tramadol initially that seemed to help but she ran out of that.  notes from initial visit are reviewed: Patient was at work walking down a flight of stairs with her right hand on the banister. She started to make the turn for the second flight of stairs her foot slipped on the stair and she almost fell, grabbing the banister with her right hand to steady herself. She felt an immediate sharp pain in her right shoulder. Since then she's had continued pain, particularly at night and with certain motions. Overhead reaching or trying to pick up something her most painful. She is right-hand dominant. She's had no numbness or tingling in her hands  PERTINENT PMH / PSH:  No prior shoulder injury, no history of shoulder surgeries, no neck surgeries   Review of Systems No fever, sweats, chills, unusual weight change. No new numbness or tingling in the right hand. No new weakness in the right upper extremity.    Objective:   Physical Exam  Vital signs are reviewed GENERAL: Well-developed female no acute distress SHOULDERS: Bilaterally they're symmetrical without any droop.  RIGHT shoulder. Full passive range of motion. Active range of motion is limited to elevation in the forward and lateral planes at about 90 secondary to pain.. She has weakness with supraspinatus and subscapularis testing most likely with weakness related to pain  although this is difficult to interpret.. Distally she is neurovascularly intact. The deltoid is nontender. The bicipital tendon is nontender.       Assessment & Plan:  Rotator cuff tear. We saw this at last office visit on ultrasound. She has been diligent in her home exercise program and has made very little progress. They get this point need to further evaluate the rotator cuff and underlying glenoid and we'll set her up for MRI. In the interim I would continue the home exercise program. For pain I will give her another or scription of the tramadol to be used sparingly.

## 2013-04-17 ENCOUNTER — Telehealth: Payer: Self-pay | Admitting: *Deleted

## 2013-04-17 DIAGNOSIS — M25511 Pain in right shoulder: Secondary | ICD-10-CM

## 2013-04-17 NOTE — Telephone Encounter (Signed)
Received MRI results-pt has a full thickness rotator cuff tear.  Per Dr. Jennette Kettle scheduled pt for appt at [facility] with Dr. Dion Saucier on 04/19/13 at 3:15pm.  Approved by Tiburcio Bash at [place].  Faxed worker's comp info to [facility].  Pt notified of appt.

## 2013-04-17 NOTE — Telephone Encounter (Signed)
Message copied by Mora Bellman on Mon Apr 17, 2013  5:03 PM ------      Message from: CERESI, MELANIE L      Created: Mon Apr 17, 2013 12:11 PM      Regarding: mri results      Contact: [phone]       Rt shoulder mri results ------

## 2013-06-23 ENCOUNTER — Other Ambulatory Visit: Payer: Self-pay | Admitting: Family Medicine

## 2013-07-31 ENCOUNTER — Telehealth: Payer: Self-pay

## 2013-07-31 NOTE — Telephone Encounter (Signed)
Ok to take biotin.  The recommended daily intake is only - some supplements have doses as high as 10,029mcg, which is probably not harmful, but also probably not helpful.  A lower dose, like , is likely more than enough.  It is taken once daily

## 2013-07-31 NOTE — Telephone Encounter (Signed)
Patient advised.

## 2013-07-31 NOTE — Telephone Encounter (Signed)
Please advise 

## 2013-07-31 NOTE — Telephone Encounter (Signed)
Pt  Needs to know if it is ok to take Biotin and if so how much?   Best phone for pt is [phone]

## 2013-08-18 ENCOUNTER — Ambulatory Visit (INDEPENDENT_AMBULATORY_CARE_PROVIDER_SITE_OTHER): Payer: BC Managed Care – PPO | Admitting: Family Medicine

## 2013-08-18 VITALS — BP 118/72 | HR 102 | Temp 98.9°F | Resp 18 | Ht 66.0 in | Wt 239.0 lb

## 2013-08-18 DIAGNOSIS — J029 Acute pharyngitis, unspecified: Secondary | ICD-10-CM

## 2013-08-18 DIAGNOSIS — J020 Streptococcal pharyngitis: Secondary | ICD-10-CM

## 2013-08-18 LAB — POCT RAPID STREP A (OFFICE): Rapid Strep A Screen: POSITIVE — AB

## 2013-08-18 MED ORDER — PENICILLIN V POTASSIUM 500 MG PO TABS: 500.0000 mg | ORAL_TABLET | Freq: Two times a day (BID) | ORAL | Status: DC

## 2013-08-18 NOTE — Progress Notes (Signed)
[facility] [address] [place] [address] [phone]  Date:  08/18/2013   Name:  Katherine [name]   DOB:  November 23, 1955   MRN:  [id number]  PCP:  [name], MD    Chief Complaint: swollen glands, sore throat, headache   History of Present Illness:  Katherine [name] is a 57 y.o. very pleasant female patient who presents with the following:  She is here today with illness.  She has noted a ST, swollen glands in her neck, headache and lower back pain for about 3 days.    She did have a fever for the last copule of days up to 101.  She has noted chills.   She has not been able to eat much due to her ST.   No nasal symptoms.  She is coughing up some mucus but otherwise does not have a severe cough.   No antipyretics today.   No dysuria, no hematuria.   She is around her grandson a fair amount.  He is 42 year old.  She is not sure if he might have been ill recently   Patient Active Problem List   Diagnosis Date Noted  . Rotator cuff tear 01/27/2013  . Hypertension     Past Medical History  Diagnosis Date  . Hypertension     Past Surgical History  Procedure Laterality Date  . Cholecystectomy      History  Substance Use Topics  . Smoking status: Never Smoker   . Smokeless tobacco: Never Used  . Alcohol Use: Yes     Comment: social    Family History  Problem Relation Age of Onset  . Diabetes Father   . Hypertension Father   . Sickle cell anemia Brother   . Cancer Maternal Grandmother     breast cancer  . Cancer Brother     Allergies  Allergen Reactions  . Ace Inhibitors Cough  . Adhesive [Tape] Rash    Medication list has been reviewed and updated.  Current Outpatient Prescriptions on File Prior to Visit  Medication Sig Dispense Refill  . losartan-hydrochlorothiazide (HYZAAR) 100-12.5 MG per tablet TAKE ONE TABLET BY MOUTH ONCE DAILY  90 tablet  0  . erythromycin ophthalmic ointment Place into the left eye every 6 (six) hours.   3.5 g  0  . fish oil-omega-3 fatty acids 1000 MG capsule Take 2 g by mouth daily.      . traMADol (ULTRAM) 50 MG tablet Take 1 tablet (50 mg total) by mouth every 8 (eight) hours as needed for pain.  90 tablet  1  . vitamin B-12 (CYANOCOBALAMIN) 500 MCG tablet Take 500 mcg by mouth daily.       No current facility-administered medications on file prior to visit.    Review of Systems:  As per HPI- otherwise negative.   Physical Examination: Filed Vitals:   08/18/13 0827  BP: 118/72  Pulse: 111  Temp: 98.9 F (37.2 C)  Resp: 18   Filed Vitals:   08/18/13 0827  Height: 5\' 6"  (1.676 m)  Weight: 239 lb (108.41 kg)   Body mass index is 38.59 kg/(m^2). Ideal Body Weight: Weight in (lb) to have BMI = 25: 154.6  GEN: WDWN, NAD, Non-toxic, A & O x 3, obese HEENT: Atraumatic, Normocephalic. Neck supple. No masses, No LAD.  Bilateral TM wnl, oropharynx shows erythematous tonsils bilaterally with exudate.  PEERL,EOMI.   Ears and Nose: No external deformity. CV: RRR, No M/G/R. No  JVD. No thrill. No extra heart sounds. PULM: CTA B, no wheezes, crackles, rhonchi. No retractions. No resp. distress. No accessory muscle use. No CVA tenderness EXTR: No c/c/e NEURO Normal gait.  PSYCH: Normally interactive. Conversant. Not depressed or anxious appearing.  Calm demeanor.   Results for orders placed in visit on 08/18/13  POCT RAPID STREP A (OFFICE)      Result Value Range   Rapid Strep A Screen Positive (*) Negative    Assessment and Plan: Streptococcal sore throat - Plan: penicillin v potassium (VEETID) 500 MG tablet  Acute pharyngitis - Plan: POCT rapid strep A  Strep throat.  Treat with penicillin for 10 days.  She will speak with her daughter to ensure her grandson is ok.   See patient instructions for more details.     Signed Abbe Amsterdam, MD

## 2013-08-18 NOTE — Patient Instructions (Signed)
You have strep throat.  The penicillin should get you better within 1 or 2 days.  Let me know if you are not feeling better- Sooner if worse.   Drink plenty of fluids and use medications as needed for pain and fever.

## 2013-08-22 ENCOUNTER — Telehealth: Payer: Self-pay

## 2013-08-22 MED ORDER — BENZONATATE 100 MG PO CAPS: 100.0000 mg | ORAL_CAPSULE | Freq: Three times a day (TID) | ORAL | Status: DC | PRN

## 2013-08-22 MED ORDER — GUAIFENESIN ER 1200 MG PO TB12: 1.0000 | ORAL_TABLET | Freq: Two times a day (BID) | ORAL | Status: DC | PRN

## 2013-08-22 NOTE — Telephone Encounter (Signed)
Spoke with pt advised Rx's sent in to her pharmacy.

## 2013-08-22 NOTE — Telephone Encounter (Signed)
Pt states called this morning, however there does not appear to be a previous message. Pt states was seen on 08/18/13, sore throat is better, but cough and chest congestion is worse and is coughing up "green stuff". Pt wants to know if we can call something in for the cough and chest congestion to the [facility] on [address]

## 2013-08-22 NOTE — Telephone Encounter (Signed)
Meds ordered this encounter  Medications  . benzonatate (TESSALON) 100 MG capsule    Sig: Take 1-2 capsules (100-200 mg total) by mouth 3 (three) times daily as needed for cough.    Dispense:  40 capsule    Refill:  0    Order Specific Question:  Supervising Provider    Answer:  DOOLITTLE, ROBERT P [[id number]]  . Guaifenesin (MUCINEX MAXIMUM STRENGTH) 1200 MG TB12    Sig: Take 1 tablet (1,200 mg total) by mouth every 12 (twelve) hours as needed.    Dispense:  14 tablet    Refill:  1    Order Specific Question:  Supervising Provider    Answer:  DOOLITTLE, ROBERT P [[id number]]

## 2013-09-15 ENCOUNTER — Ambulatory Visit (INDEPENDENT_AMBULATORY_CARE_PROVIDER_SITE_OTHER): Payer: BC Managed Care – PPO | Admitting: Family Medicine

## 2013-09-15 ENCOUNTER — Ambulatory Visit: Payer: BC Managed Care – PPO

## 2013-09-15 VITALS — BP 125/81 | HR 91 | Temp 97.7°F | Resp 16 | Ht 66.0 in | Wt 241.0 lb

## 2013-09-15 DIAGNOSIS — R05 Cough: Secondary | ICD-10-CM

## 2013-09-15 DIAGNOSIS — S134XXA Sprain of ligaments of cervical spine, initial encounter: Secondary | ICD-10-CM

## 2013-09-15 DIAGNOSIS — S8992XA Unspecified injury of left lower leg, initial encounter: Secondary | ICD-10-CM

## 2013-09-15 DIAGNOSIS — M542 Cervicalgia: Secondary | ICD-10-CM

## 2013-09-15 DIAGNOSIS — M75101 Unspecified rotator cuff tear or rupture of right shoulder, not specified as traumatic: Secondary | ICD-10-CM

## 2013-09-15 DIAGNOSIS — S43429A Sprain of unspecified rotator cuff capsule, initial encounter: Secondary | ICD-10-CM

## 2013-09-15 DIAGNOSIS — S8990XA Unspecified injury of unspecified lower leg, initial encounter: Secondary | ICD-10-CM

## 2013-09-15 MED ORDER — MELOXICAM 15 MG PO TABS: 15.0000 mg | ORAL_TABLET | Freq: Every day | ORAL | Status: DC

## 2013-09-15 MED ORDER — TRAMADOL HCL 50 MG PO TABS: 50.0000 mg | ORAL_TABLET | Freq: Three times a day (TID) | ORAL | Status: DC | PRN

## 2013-09-15 MED ORDER — BENZONATATE 100 MG PO CAPS: 100.0000 mg | ORAL_CAPSULE | Freq: Three times a day (TID) | ORAL | Status: DC | PRN

## 2013-09-15 MED ORDER — METHOCARBAMOL 500 MG PO TABS: 500.0000 mg | ORAL_TABLET | Freq: Four times a day (QID) | ORAL | Status: DC
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
## 2013-09-19 ENCOUNTER — Ambulatory Visit (INDEPENDENT_AMBULATORY_CARE_PROVIDER_SITE_OTHER): Payer: BC Managed Care – PPO | Admitting: Family Medicine

## 2013-09-19 ENCOUNTER — Telehealth: Payer: Self-pay

## 2013-09-19 VITALS — BP 130/86 | HR 72 | Temp 98.5°F | Resp 16 | Ht 65.5 in | Wt 243.0 lb

## 2013-09-19 DIAGNOSIS — M79662 Pain in left lower leg: Secondary | ICD-10-CM

## 2013-09-19 DIAGNOSIS — T148XXA Other injury of unspecified body region, initial encounter: Secondary | ICD-10-CM

## 2013-09-19 DIAGNOSIS — M899 Disorder of bone, unspecified: Secondary | ICD-10-CM

## 2013-09-19 MED ORDER — OXYCODONE-ACETAMINOPHEN 5-325 MG PO TABS: 1.0000 | ORAL_TABLET | Freq: Three times a day (TID) | ORAL | Status: DC | PRN

## 2013-09-19 NOTE — Telephone Encounter (Signed)
PT STATES SHE HAD CALLED YESTERDAY REGARDING THE PAIN IN HER KNEE WHICH HAS GOTTEN SEVERE ESPECIALLY THROUGH THE NIGHT, HASN'T BEEN ABLE TO SLEEP PLEASE CALL [phone]    [facility] ON [address]

## 2013-09-19 NOTE — Telephone Encounter (Signed)
Plan is : Knee injuries, left, initial encounter - Plan: DG Knee Complete 4 Views Left - poss meniscal injury due to medial joint line tenderness but exam otherwise nml. Rec watchful waiting, cont ice. Prn tramadol. If cont, RTC and cons PT, MRI, vs referral back to Dr. Dion Saucier. Called her, advised RTC. Did not get message yesterday.
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
## 2013-09-19 NOTE — Patient Instructions (Signed)
Bone Bruise  °A bone bruise is a small hidden fracture of the bone. It typically occurs with bones located close to the surface of the skin.  °SYMPTOMS °· The pain lasts longer than a normal bruise. °· The bruised area is difficult to use. °· There may be discoloration or swelling of the bruised area. °· When a bone bruise is found with injury to the anterior cruciate ligament (in the knee) there is often an increased: °· Amount of fluid in the knee °· Time the fluid in the knee lasts. °· Number of days until you are walking normally and regaining the motion you had before the injury. °· Number of days with pain from the injury. °DIAGNOSIS  °It can only be seen on X-rays known as MRIs. This stands for magnetic resonance imaging. A regular X-ray taken of a bone bruise would appear to be normal. A bone bruise is a common injury in the knee and the heel bone (calcaneus). The problems are similar to those produced by stress fractures, which are bone injuries caused by overuse. A bone bruise may also be a sign of other injuries. For example, bone bruises are commonly found where an anterior cruciate ligament (ACL) in the knee has been pulled away from the bone (ruptured). A ligament is a tough fibrous material that connects bones together to make our joints stable. Bruises of the bone last a lot longer than bruises of the muscle or tissues beneath the skin. Bone bruises can last from days to months and are often more severe and painful than other bruises. °TREATMENT °Because bone bruises are sudden injuries you cannot often prevent them, other than by being extremely careful. Some things you can do to improve the condition are: °· Apply ice to the sore area for 15-20 minutes, 03-04 times per day while awake for the first 2 days. Put the ice in a plastic bag, and place a towel between the bag of ice and your skin. °· Keep your bruised area raised (elevated) when possible to lessen swelling. °· For activity: °· Use  crutches when necessary; do not put weight on the injured leg until you are no longer tender. °· You may walk on your affected part as the pain allows, or as instructed. °· Start weight bearing gradually on the bruised part. °· Continue to use crutches or a cane until you can stand without causing pain, or as instructed. °· If a plaster splint was applied, wear the splint until you are seen for a follow-up examination. Rest it on nothing harder than a pillow the first 24 hours. Do not put weight on it. Do not get it wet. You may take it off to take a shower or bath. °· If an air splint was applied, more air may be blown into or out of the splint as needed for comfort. You may take it off at night and to take a shower or bath. °· Wiggle your toes in the splint several times per day if you are able. °· You may have been given an elastic bandage to use with the plaster splint or alone. The splint is too tight if you have numbness, tingling or if your foot becomes cold and blue. Adjust the bandage to make it comfortable. °· Only take over-the-counter or prescription medicines for pain, discomfort, or fever as directed by your caregiver. °· Follow all instructions for follow up with your caregiver. This includes any orthopedic referrals, physical therapy, and rehabilitation. Any delay in   obtaining necessary care could result in a delay or failure of the bones to heal. °SEEK MEDICAL CARE IF:  °· You have an increase in bruising, swelling, or pain. °· You notice coldness of your toes. °· You do not get pain relief with medications. °SEEK IMMEDIATE MEDICAL CARE IF:  °· Your toes are numb or blue. °· You have severe pain not controlled with medications. °· If any of the problems that caused you to seek care are becoming worse. °Document Released: 12/05/2003 Document Revised: 12/07/2011 Document Reviewed: 04/26/2013 °[employer]® Patient Information ©2014 [employer]. ° °

## 2013-10-01 ENCOUNTER — Other Ambulatory Visit: Payer: Self-pay | Admitting: Physician Assistant

## 2013-10-20 ENCOUNTER — Ambulatory Visit: Payer: BC Managed Care – PPO | Admitting: Family Medicine

## 2013-10-20 ENCOUNTER — Ambulatory Visit (INDEPENDENT_AMBULATORY_CARE_PROVIDER_SITE_OTHER): Payer: BC Managed Care – PPO | Admitting: Family Medicine

## 2013-10-20 VITALS — BP 130/81 | HR 85 | Temp 98.2°F | Resp 16 | Ht 66.0 in | Wt 245.0 lb

## 2013-10-20 DIAGNOSIS — Z79899 Other long term (current) drug therapy: Secondary | ICD-10-CM

## 2013-10-20 DIAGNOSIS — E669 Obesity, unspecified: Secondary | ICD-10-CM

## 2013-10-20 DIAGNOSIS — S99919A Unspecified injury of unspecified ankle, initial encounter: Secondary | ICD-10-CM

## 2013-10-20 DIAGNOSIS — R059 Cough, unspecified: Secondary | ICD-10-CM

## 2013-10-20 DIAGNOSIS — R7309 Other abnormal glucose: Secondary | ICD-10-CM

## 2013-10-20 DIAGNOSIS — S8990XA Unspecified injury of unspecified lower leg, initial encounter: Secondary | ICD-10-CM

## 2013-10-20 DIAGNOSIS — R7303 Prediabetes: Secondary | ICD-10-CM | POA: Insufficient documentation

## 2013-10-20 DIAGNOSIS — S99929A Unspecified injury of unspecified foot, initial encounter: Secondary | ICD-10-CM

## 2013-10-20 DIAGNOSIS — S8992XA Unspecified injury of left lower leg, initial encounter: Secondary | ICD-10-CM

## 2013-10-20 DIAGNOSIS — R635 Abnormal weight gain: Secondary | ICD-10-CM

## 2013-10-20 DIAGNOSIS — I10 Essential (primary) hypertension: Secondary | ICD-10-CM

## 2013-10-20 DIAGNOSIS — R05 Cough: Secondary | ICD-10-CM

## 2013-10-20 LAB — POCT GLYCOSYLATED HEMOGLOBIN (HGB A1C): HEMOGLOBIN A1C: 6.1

## 2013-10-20 LAB — GLUCOSE, POCT (MANUAL RESULT ENTRY): POC GLUCOSE: 130 mg/dL — AB (ref 70–99)

## 2013-10-20 MED ORDER — BENZONATATE 100 MG PO CAPS: 100.0000 mg | ORAL_CAPSULE | Freq: Three times a day (TID) | ORAL | Status: DC | PRN

## 2013-10-20 MED ORDER — MELOXICAM 15 MG PO TABS
15.0000 mg | ORAL_TABLET | Freq: Every day | ORAL | Status: DC
Start: 2013-10-20 — End: 2013-12-14

## 2013-10-20 MED ORDER — LOSARTAN POTASSIUM-HCTZ 100-12.5 MG PO TABS: 1.0000 | ORAL_TABLET | Freq: Every day | ORAL | Status: DC

## 2013-10-20 MED ORDER — HYDROCODONE-ACETAMINOPHEN 5-325 MG PO TABS: 1.0000 | ORAL_TABLET | Freq: Four times a day (QID) | ORAL | Status: DC | PRN
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
## 2013-10-20 NOTE — Patient Instructions (Signed)
Diabetes, Type 2, Am I At Risk? Diabetes is a lasting (chronic) disease. In type 2 diabetes, the pancreas does not make enough insulin, and the body does not respond normally to the insulin that is made. This type of diabetes was also previously called adult onset diabetes. About 90% of all those who have diabetes have type 2. It usually occurs after the age of 34, but can occur at any age.  People develop type 2 diabetes because they do not use insulin properly. Eventually, the pancreas cannot make enough insulin for the body's needs. Over time, the amount of glucose (sugar) in the blood increases. RISK FACTORS  Overweight  the more weight you have, the more resistant your cells become to insulin.  Family history  you are more likely to get diabetes if a parent or sibling has diabetes.  Race certain races get diabetes more.  African Americans.  American Indians.  Asian Americans.  Hispanics.  Pacific Islander.  Inactive exercise helps control weight and helps your cells be more sensitive to insulin.  Gestational diabetes  some women develop diabetes while they are pregnant. This goes away when they deliver. However, they are 50-60% more likely to develop type 2 diabetes at a later time.  Having a baby over 9 pounds  a sign that you may have had gestational diabetes.  Age the risk of diabetes goes up as you get older, especially after age 18.  High blood pressure (hypertension). SYMPTOMS Many people have no signs or symptoms. Symptoms can be so mild that you might not even notice them. Some of these signs are:  Increased thirst.  Increased hunger.  Tiredness (fatigue).  Increased urination, especially at night.  Weight loss.  Blurred vision.  Sores that do not heal. WHO SHOULD BE TESTED?  Anyone 25 years or older, especially if overweight, should consider getting tested.  If you are younger than 56, overweight, and have one or more of the risk factors, you should  consider getting tested. DIAGNOSIS  Fasting blood glucose (FBS). Usually, 2 are done.  FBS 101-125 mg/dl is considered pre-diabetes.  FBS 126 mg/dl or greater is considered diabetes.  2 hour Oral Glucose Tolerance Test (OGTT). This test is preformed by first having you not eat or drink for several hours. You are then given something sweet to drink and your blood glucose is measured fasting, at one hour and 2 hours. This test tells how well you are able to handle sugars or carbohydrates.  Fasting: 60-100 mg/dl.  1 hour: less than 200 mg/dl.  2 hours: less than 140 mg/dl.  A1c A1c is a blood glucose test that gives and average of your blood glucose over 3 months. It is the accepted method to use to diagnose diabetes.  A1c 5.7-6.4% is considered pre-diabetes.  A1c 6.5% or greater is considered diabetes. WHAT DOES IT MEAN TO HAVE PRE-DIABETES? Pre-diabetes means you are at risk for getting type 2 diabetes. Your blood glucose is higher than normal, but not yet high enough to diagnose diabetes. The good news is, if you have pre-diabetes you can reduce the risk of getting diabetes and even return to normal blood glucose levels. With modest weight loss and moderate physical activity, you can delay or prevent type 2 diabetes.  PREVENTION You cannot do anything about race, age or family history, but you can lower your chances of getting diabetes. You can:   Exercise regularly and be active.  Reduce fat and calorie intake.  Make wise food  choices as much as you can.  Reduce your intake of salt and alcohol.  Maintain a reasonable weight.  Keep blood pressure in an acceptable range. Take medication if needed.  Not smoke.  Maintain an acceptable cholesterol level (HDL, LDL, Triglycerides). Take medication if needed. DOING MY PART: GETTING STARTED Making big changes in your life is hard, especially if you are faced with more than one change. You can make it easier by taking these  steps:  Make a plan to change behavior.  Decide exactly what you will do and when you will do it.  Plan what you need to get ready.  Think about what might prevent you from reaching your goals.  Find family and friends who will support and encourage you.  Decide how you will reward yourself when you do what you have planned.  Your doctor, dietitian, or counselor can help you make a plan. HERE ARE SOME OF THE AREAS YOU MAY WISH TO CHANGE TO REDUCE YOUR RISK OF DIABETES. If you are overweight or obese, choose sensible ways to get in shape. Even small amounts of weight loss, like 5-10 pounds, can help reduce the effects of insulin resistance and help blood glucose control. Diet  Avoid crash diets. Instead, eat less of the foods you usually have. Limit the amount of fat you eat.  Increase your physical activity. Aim for at least 30 minutes of exercise most days of the week.  Set a reasonable weight-loss goal, such as losing 1 pound a week. Aim for a long-term goal of losing 5-7% of your total body weight.  Make wise food choices most of the time.  What you eat has a big impact on your health. By making wise food choices, you can help control your body weight, blood pressure, and cholesterol.  Take a hard look at the serving sizes of the foods you eat. Reduce serving sizes of meat, desserts, and foods high in fat. Increase your intake of fruits and vegetables.  Limit your fat intake to about 25% of your total calories. For example, if your food choices add up to about 2,000 calories a day, try to eat no more than 56 grams of fat. Your caregiver or a dietitian can help you figure out how much fat to have. You can check food labels for fat content too.  You may also want to reduce the number of calories you have each day.  Keep a food log. Write down what you eat, how much you eat, and anything else that helps keep you on track.  When you meet your goal, reward yourself with a nonfood  item or activity. Exercise  Be physically active every day.  Keep and exercise log. Write down what exercise you did, for how long, and anything else that keeps you on track.  Regular exercise (like brisk walking) tackles several risk factors at once. It helps you lose weight, it keeps your cholesterol and blood pressure under control, and it helps your body use insulin. People who are physically active for 30 minutes a day, 5 days a week, reduced their risk of type 2 diabetes. If you are not very active, you should start slowly at first. Talk with your caregiver first about what kinds of exercise would be safe for you. Make a plan to increase your activity level with the goal of being active for at least 30 minutes a day, most days of the week.  Choose activities you enjoy. Here are some ways to  work extra activity into your daily routine:  Take the stairs rather than an elevator or escalator.  Park at the far end of the lot and walk.  Get off the bus a few stops early and walk the rest of the way.  Walk or bicycle instead of drive whenever you can. Medications Some people need medication to help control their blood pressure or cholesterol levels. If you do, take your medicines as directed. Ask your caregiver whether there are any medicines you can take to prevent type 2 diabetes. Document Released: 09/17/2003 Document Revised: 12/07/2011 Document Reviewed: 06/12/2009 [facility] Patient Information 2014 [place], [place]. Diabetes Meal Planning Guide The diabetes meal planning guide is a tool to help you plan your meals and snacks. It is important for people with diabetes to manage their blood glucose (sugar) levels. Choosing the right foods and the right amounts throughout your day will help control your blood glucose. Eating right can even help you improve your blood pressure and reach or maintain a healthy weight. CARBOHYDRATE COUNTING MADE EASY When you eat carbohydrates, they turn to  sugar. This raises your blood glucose level. Counting carbohydrates can help you control this level so you feel better. When you plan your meals by counting carbohydrates, you can have more flexibility in what you eat and balance your medicine with your food intake. Carbohydrate counting simply means adding up the total amount of carbohydrate grams in your meals and snacks. Try to eat about the same amount at each meal. Foods with carbohydrates are listed below. Each portion below is 1 carbohydrate serving or 15 grams of carbohydrates. Ask your dietician how many grams of carbohydrates you should eat at each meal or snack. Grains and Starches  1 slice bread.   English muffin or hotdog/hamburger bun.   cup cold cereal (unsweetened).   cup cooked pasta or rice.   cup starchy vegetables (corn, potatoes, peas, beans, winter squash).  1 tortilla (6 inches).   bagel.  1 waffle or pancake (size of a CD).   cup cooked cereal.  4 to 6 small crackers. *Whole grain is recommended. Fruit  1 cup fresh unsweetened berries, melon, papaya, pineapple.  1 small fresh fruit.   banana or mango.   cup fruit juice (4 oz unsweetened).   cup canned fruit in natural juice or water.  2 tbs dried fruit.  12 to 15 grapes or cherries. Milk and Yogurt  1 cup fat-free or 1% milk.  1 cup soy milk.  6 oz light yogurt with sugar-free sweetener.  6 oz low-fat soy yogurt.  6 oz plain yogurt. Vegetables  1 cup raw or  cup cooked is counted as 0 carbohydrates or a "free" food.  If you eat 3 or more servings at 1 meal, count them as 1 carbohydrate serving. Other Carbohydrates   oz chips or pretzels.   cup ice cream or frozen yogurt.   cup sherbet or sorbet.  2 inch square cake, no frosting.  1 tbs honey, sugar, jam, jelly, or syrup.  2 small cookies.  3 squares of graham crackers.  3 cups popcorn.  6 crackers.  1 cup broth-based soup.  Count 1 cup casserole or other  mixed foods as 2 carbohydrate servings.  Foods with less than 20 calories in a serving may be counted as 0 carbohydrates or a "free" food. You may want to purchase a book or computer software that lists the carbohydrate gram counts of different foods. In addition, the nutrition facts panel  on the labels of the foods you eat are a good source of this information. The label will tell you how big the serving size is and the total number of carbohydrate grams you will be eating per serving. Divide this number by 15 to obtain the number of carbohydrate servings in a portion. Remember, 1 carbohydrate serving equals 15 grams of carbohydrate. SERVING SIZES Measuring foods and serving sizes helps you make sure you are getting the right amount of food. The list below tells how big or small some common serving sizes are.  1 oz.........4 stacked dice.  3 oz........Marland Kitchen.Deck of cards.  1 tsp.......Marland Kitchen.Tip of little finger.  1 tbs......Marland Kitchen.Marland Kitchen.Thumb.  2 tbs.......Marland Kitchen.Golf ball.   cup......Marland Kitchen.Half of a fist.  1 cup.......Marland Kitchen.A fist. SAMPLE DIABETES MEAL PLAN Below is a sample meal plan that includes foods from the grain and starches, dairy, vegetable, fruit, and meat groups. A dietician can individualize a meal plan to fit your calorie needs and tell you the number of servings needed from each food group. However, controlling the total amount of carbohydrates in your meal or snack is more important than making sure you include all of the food groups at every meal. You may interchange carbohydrate containing foods (dairy, starches, and fruits). The meal plan below is an example of a 2000 calorie diet using carbohydrate counting. This meal plan has 17 carbohydrate servings. Breakfast  1 cup oatmeal (2 carb servings).   cup light yogurt (1 carb serving).  1 cup blueberries (1 carb serving).   cup almonds. Snack  1 large apple (2 carb servings).  1 low-fat string cheese stick. Lunch  Chicken breast salad.  1 cup  spinach.   cup chopped tomatoes.  2 oz chicken breast, sliced.  2 tbs low-fat [place] dressing.  12 whole-wheat crackers (2 carb servings).  12 to 15 grapes (1 carb serving).  1 cup low-fat milk (1 carb serving). Snack  1 cup carrots.   cup hummus (1 carb serving). Dinner  3 oz broiled salmon.  1 cup brown rice (3 carb servings). Snack  1  cups steamed broccoli (1 carb serving) drizzled with 1 tsp olive oil and lemon juice.  1 cup light pudding (2 carb servings). DIABETES MEAL PLANNING WORKSHEET Your dietician can use this worksheet to help you decide how many servings of foods and what types of foods are right for you.  BREAKFAST Food Group and Servings / Carb Servings Grain/Starches __________________________________ Dairy __________________________________________ Vegetable ______________________________________ Fruit ___________________________________________ Meat __________________________________________ Fat ____________________________________________ LUNCH Food Group and Servings / Carb Servings Grain/Starches ___________________________________ Dairy ___________________________________________ Fruit ____________________________________________ Meat ___________________________________________ Fat _____________________________________________ Laural GoldenINNER Food Group and Servings / Carb Servings Grain/Starches ___________________________________ Dairy ___________________________________________ Fruit ____________________________________________ Meat ___________________________________________ Fat _____________________________________________ SNACKS Food Group and Servings / Carb Servings Grain/Starches ___________________________________ Dairy ___________________________________________ Vegetable _______________________________________ Fruit ____________________________________________ Meat ___________________________________________ Fat  _____________________________________________ DAILY TOTALS Starches _________________________ Vegetable ________________________ Fruit ____________________________ Dairy ____________________________ Meat ____________________________ Fat ______________________________ Document Released: 06/11/2005 Document Revised: 12/07/2011 Document Reviewed: 04/22/2009 [employer] Patient Information 2014 [employer].

## 2013-10-21 ENCOUNTER — Encounter: Payer: Self-pay | Admitting: Family Medicine

## 2013-10-21 LAB — COMPREHENSIVE METABOLIC PANEL
ALT: 21 U/L (ref 0–35)
AST: 18 U/L (ref 0–37)
Albumin: 3.9 g/dL (ref 3.5–5.2)
Alkaline Phosphatase: 78 U/L (ref 39–117)
BILIRUBIN TOTAL: 0.7 mg/dL (ref 0.3–1.2)
BUN: 16 mg/dL (ref 6–23)
CO2: 27 meq/L (ref 19–32)
CREATININE: 0.91 mg/dL (ref 0.50–1.10)
Calcium: 9.2 mg/dL (ref 8.4–10.5)
Chloride: 102 mEq/L (ref 96–112)
Glucose, Bld: 130 mg/dL — ABNORMAL HIGH (ref 70–99)
Potassium: 4 mEq/L (ref 3.5–5.3)
Sodium: 138 mEq/L (ref 135–145)
Total Protein: 7.5 g/dL (ref 6.0–8.3)

## 2013-10-21 LAB — TSH: TSH: 1.958 u[IU]/mL (ref 0.350–4.500)

## 2013-11-28 ENCOUNTER — Ambulatory Visit (INDEPENDENT_AMBULATORY_CARE_PROVIDER_SITE_OTHER): Payer: BC Managed Care – PPO | Admitting: Family Medicine

## 2013-11-28 VITALS — BP 166/86 | HR 82 | Temp 98.1°F | Resp 18 | Ht 65.5 in | Wt 246.0 lb

## 2013-11-28 DIAGNOSIS — R05 Cough: Secondary | ICD-10-CM

## 2013-11-28 DIAGNOSIS — I10 Essential (primary) hypertension: Secondary | ICD-10-CM

## 2013-11-28 DIAGNOSIS — R059 Cough, unspecified: Secondary | ICD-10-CM

## 2013-11-28 DIAGNOSIS — R053 Chronic cough: Secondary | ICD-10-CM

## 2013-11-28 MED ORDER — HYDROCHLOROTHIAZIDE 12.5 MG PO CAPS: 12.5000 mg | ORAL_CAPSULE | Freq: Every day | ORAL | Status: DC

## 2013-11-28 MED ORDER — AMLODIPINE BESYLATE 5 MG PO TABS: 5.0000 mg | ORAL_TABLET | Freq: Every day | ORAL | Status: DC

## 2013-11-28 NOTE — Patient Instructions (Signed)

## 2013-11-28 NOTE — Progress Notes (Signed)
Hypertension  Cough  HPI Comments: Katherine [name] is a 58 y.o. female who presents to the Emergency Department complaining of hypertension. She reports that she has been taking Losartan HCTZ 12.5 for over a year and has been experiencing "fluctuating" bp. Her father has diabetes and her two brothers have HTN. She has an exercise routuience and is regulating her salt intake. Pt has a persistent hacking cough for the past 6 months. In the past, she had a cough associated to Lisinopril adverse reaction. Denies smoking.  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 She spends her free time caring for her grandson two times a week and traveling. 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   Objective: No acute distress patient is mildly overweight HEENT: Unremarkable Neck: Supple no adenopathy Chest: Clear Heart: Regular no murmur or gallop Extremities: No edema  Assessment: Mild hypertension, poorly controlled with associated cough which is probably related to the losartan.  Plan:Hypertension - Plan: amLODipine (NORVASC) 5 MG tablet, hydrochlorothiazide (MICROZIDE) 12.5 MG capsule  Chronic cough  Signed, Elvina SidleKurt Nikeshia Keetch, MD

## 2013-12-14 ENCOUNTER — Ambulatory Visit (INDEPENDENT_AMBULATORY_CARE_PROVIDER_SITE_OTHER): Payer: BC Managed Care – PPO | Admitting: Family Medicine

## 2013-12-14 VITALS — BP 128/80 | HR 83 | Temp 98.0°F | Resp 16 | Ht 65.5 in | Wt 248.0 lb

## 2013-12-14 DIAGNOSIS — K219 Gastro-esophageal reflux disease without esophagitis: Secondary | ICD-10-CM

## 2013-12-14 DIAGNOSIS — M129 Arthropathy, unspecified: Secondary | ICD-10-CM

## 2013-12-14 DIAGNOSIS — I10 Essential (primary) hypertension: Secondary | ICD-10-CM

## 2013-12-14 DIAGNOSIS — M1990 Unspecified osteoarthritis, unspecified site: Secondary | ICD-10-CM

## 2013-12-14 MED ORDER — MELOXICAM 15 MG PO TABS: 15.0000 mg | ORAL_TABLET | Freq: Every day | ORAL | Status: DC

## 2013-12-14 MED ORDER — OMEPRAZOLE 20 MG PO CPDR: 20.0000 mg | DELAYED_RELEASE_CAPSULE | Freq: Every day | ORAL | Status: DC
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
## 2013-12-14 NOTE — Patient Instructions (Signed)

## 2014-01-04 ENCOUNTER — Other Ambulatory Visit: Payer: Self-pay

## 2014-01-04 DIAGNOSIS — Z1231 Encounter for screening mammogram for malignant neoplasm of breast: Secondary | ICD-10-CM

## 2014-02-01 ENCOUNTER — Encounter (INDEPENDENT_AMBULATORY_CARE_PROVIDER_SITE_OTHER): Payer: Self-pay

## 2014-02-01 ENCOUNTER — Ambulatory Visit
Admission: RE | Admit: 2014-02-01 | Discharge: 2014-02-01 | Disposition: A | Discharge status: Home / Self Care | Payer: BC Managed Care – PPO | Source: Ambulatory Visit

## 2014-02-01 DIAGNOSIS — Z1231 Encounter for screening mammogram for malignant neoplasm of breast: Secondary | ICD-10-CM

## 2014-03-24 ENCOUNTER — Other Ambulatory Visit: Payer: Self-pay | Admitting: Family Medicine

## 2014-04-20 ENCOUNTER — Telehealth: Payer: Self-pay

## 2014-04-20 ENCOUNTER — Other Ambulatory Visit: Payer: Self-pay | Admitting: Family Medicine

## 2014-04-20 DIAGNOSIS — M1990 Unspecified osteoarthritis, unspecified site: Secondary | ICD-10-CM

## 2014-04-20 MED ORDER — HYDROCODONE-ACETAMINOPHEN 5-325 MG PO TABS: 1.0000 | ORAL_TABLET | Freq: Four times a day (QID) | ORAL | Status: DC | PRN

## 2014-04-20 MED ORDER — MELOXICAM 15 MG PO TABS: 15.0000 mg | ORAL_TABLET | Freq: Every day | ORAL | Status: DC

## 2014-04-20 NOTE — Telephone Encounter (Signed)
Patient is in office now with the old prescription. She needs a new script for both.

## 2014-04-20 NOTE — Telephone Encounter (Signed)
Patient presented today requesting refill of meloxicam and norco. She has brought in her norco prescription from 10/20/2013 that was unfilled and is now expired. Will refill meds.

## 2014-04-20 NOTE — Telephone Encounter (Signed)
Pt had an RX for hydrocodone written on 10/20/13. She went today to fill it and it expired. The pt would like to get another rx written for the hydrocodone. She is also interested in a refill of MOBIC please!

## 2014-04-20 NOTE — Telephone Encounter (Signed)
Patient called again, I advised to allow 24-48 hours for the refill. I advised that she may need to be seen for hydrocodone refill. Patient is coming to office

## 2014-04-20 NOTE — Telephone Encounter (Signed)
Pt given script
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
## 2014-06-23 ENCOUNTER — Ambulatory Visit (INDEPENDENT_AMBULATORY_CARE_PROVIDER_SITE_OTHER): Payer: BC Managed Care – PPO | Admitting: Family Medicine

## 2014-06-23 VITALS — BP 118/80 | HR 91 | Temp 98.2°F | Resp 16 | Ht 65.5 in | Wt 240.6 lb

## 2014-06-23 DIAGNOSIS — E669 Obesity, unspecified: Secondary | ICD-10-CM

## 2014-06-23 DIAGNOSIS — M75101 Unspecified rotator cuff tear or rupture of right shoulder, not specified as traumatic: Secondary | ICD-10-CM

## 2014-06-23 DIAGNOSIS — I10 Essential (primary) hypertension: Secondary | ICD-10-CM

## 2014-06-23 DIAGNOSIS — Z0000 Encounter for general adult medical examination without abnormal findings: Secondary | ICD-10-CM

## 2014-06-23 DIAGNOSIS — M129 Arthropathy, unspecified: Secondary | ICD-10-CM

## 2014-06-23 DIAGNOSIS — M1732 Unilateral post-traumatic osteoarthritis, left knee: Secondary | ICD-10-CM

## 2014-06-23 DIAGNOSIS — S43429A Sprain of unspecified rotator cuff capsule, initial encounter: Secondary | ICD-10-CM

## 2014-06-23 DIAGNOSIS — Z23 Encounter for immunization: Secondary | ICD-10-CM

## 2014-06-23 DIAGNOSIS — Z79899 Other long term (current) drug therapy: Secondary | ICD-10-CM

## 2014-06-23 DIAGNOSIS — R110 Nausea: Secondary | ICD-10-CM

## 2014-06-23 DIAGNOSIS — R7309 Other abnormal glucose: Secondary | ICD-10-CM

## 2014-06-23 DIAGNOSIS — R7303 Prediabetes: Secondary | ICD-10-CM

## 2014-06-23 DIAGNOSIS — M79671 Pain in right foot: Secondary | ICD-10-CM

## 2014-06-23 DIAGNOSIS — M79609 Pain in unspecified limb: Secondary | ICD-10-CM

## 2014-06-23 DIAGNOSIS — M175 Other unilateral secondary osteoarthritis of knee: Secondary | ICD-10-CM

## 2014-06-23 DIAGNOSIS — M1990 Unspecified osteoarthritis, unspecified site: Secondary | ICD-10-CM | POA: Insufficient documentation

## 2014-06-23 LAB — COMPREHENSIVE METABOLIC PANEL
ALBUMIN: 3.9 g/dL (ref 3.5–5.2)
ALT: 20 U/L (ref 0–35)
AST: 16 U/L (ref 0–37)
Alkaline Phosphatase: 82 U/L (ref 39–117)
BILIRUBIN TOTAL: 0.6 mg/dL (ref 0.2–1.2)
BUN: 18 mg/dL (ref 6–23)
CALCIUM: 9.5 mg/dL (ref 8.4–10.5)
CHLORIDE: 104 meq/L (ref 96–112)
CO2: 25 meq/L (ref 19–32)
Creat: 0.92 mg/dL (ref 0.50–1.10)
GLUCOSE: 128 mg/dL — AB (ref 70–99)
Potassium: 3.5 mEq/L (ref 3.5–5.3)
SODIUM: 140 meq/L (ref 135–145)
TOTAL PROTEIN: 7.4 g/dL (ref 6.0–8.3)

## 2014-06-23 LAB — LIPID PANEL
Cholesterol: 151 mg/dL (ref 0–200)
HDL: 35 mg/dL — AB (ref >39)
LDL Cholesterol: 90 mg/dL (ref 0–99)
Total CHOL/HDL Ratio: 4.3 Ratio
Triglycerides: 131 mg/dL (ref <150)
VLDL: 26 mg/dL (ref 0–40)

## 2014-06-23 LAB — POCT CBC
GRANULOCYTE PERCENT: 47.2 % (ref 37–80)
HCT, POC: 42.9 % (ref 37.7–47.9)
Hemoglobin: 14.2 g/dL (ref 12.2–16.2)
Lymph, poc: 2.2 (ref 0.6–3.4)
MCH, POC: 28 pg (ref 27–31.2)
MCHC: 33.1 g/dL (ref 31.8–35.4)
MCV: 84.6 fL (ref 80–97)
MID (CBC): 0.3 (ref 0–0.9)
MPV: 9.3 fL (ref 0–99.8)
POC GRANULOCYTE: 2.2 (ref 2–6.9)
POC LYMPH %: 47.1 % (ref 10–50)
POC MID %: 5.7 %M (ref 0–12)
Platelet Count, POC: 221 10*3/uL (ref 142–424)
RBC: 5.07 M/uL (ref 4.04–5.48)
RDW, POC: 14.8 %
WBC: 4.6 10*3/uL (ref 4.6–10.2)

## 2014-06-23 LAB — POCT URINALYSIS DIPSTICK
Blood, UA: NEGATIVE
GLUCOSE UA: NEGATIVE
LEUKOCYTES UA: NEGATIVE
NITRITE UA: NEGATIVE
Spec Grav, UA: 1.025
UROBILINOGEN UA: 0.2
pH, UA: 5

## 2014-06-23 LAB — POCT UA - MICROSCOPIC ONLY
Casts, Ur, LPF, POC: NEGATIVE
Crystals, Ur, HPF, POC: NEGATIVE
YEAST UA: NEGATIVE

## 2014-06-23 LAB — POCT GLYCOSYLATED HEMOGLOBIN (HGB A1C): HEMOGLOBIN A1C: 6.1

## 2014-06-23 LAB — TSH: TSH: 1.82 u[IU]/mL (ref 0.350–4.500)

## 2014-06-23 MED ORDER — HYDROCHLOROTHIAZIDE 12.5 MG PO CAPS: 12.5000 mg | ORAL_CAPSULE | Freq: Every day | ORAL | Status: DC

## 2014-06-23 MED ORDER — MELOXICAM 15 MG PO TABS: 15.0000 mg | ORAL_TABLET | Freq: Every day | ORAL | Status: DC

## 2014-06-23 MED ORDER — RANITIDINE HCL 150 MG PO TABS: 150.0000 mg | ORAL_TABLET | Freq: Two times a day (BID) | ORAL | Status: DC | PRN

## 2014-06-23 MED ORDER — TRAMADOL HCL 50 MG PO TABS: 50.0000 mg | ORAL_TABLET | Freq: Four times a day (QID) | ORAL | Status: DC | PRN

## 2014-06-23 MED ORDER — AMLODIPINE BESYLATE 5 MG PO TABS: 5.0000 mg | ORAL_TABLET | Freq: Every day | ORAL | Status: DC
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
## 2014-06-23 NOTE — Patient Instructions (Addendum)
Start taking daily glucosamine-chondroiton supplement to help with arthritis pain. Lets try you off the omeprazole - I suspect this isn't helping. If you have occasional heartburn symptoms, try zantac or pepcid instead. If you have nausea, try supplementing with ginger and vitamin B6 to help. Try a lactose-free diet to see if this helps with your symptoms.  Keeping You Healthy  Get These Tests  Blood Pressure- Have your blood pressure checked by your healthcare provider at least once a year.  Normal blood pressure is 120/80.  Weight- Have your body mass index (BMI) calculated to screen for obesity.  BMI is a measure of body fat based on height and weight.  You can calculate your own BMI at https://www.west-esparza.com/  Cholesterol- Have your cholesterol checked every year.  Diabetes- Have your blood sugar checked every year if you have high blood pressure, high cholesterol, a family history of diabetes or if you are overweight.  Pap Smear- Have a pap smear every 1 to 3 years if you have been sexually active.  If you are older than 65 and recent pap smears have been normal you may not need additional pap smears.  In addition, if you have had a hysterectomy  For benign disease additional pap smears are not necessary.  Mammogram-Yearly mammograms are essential for early detection of breast cancer  Screening for Colon Cancer- Colonoscopy starting at age 50. Screening may begin sooner depending on your family history and other health conditions.  Follow up colonoscopy as directed by your Gastroenterologist.  Screening for Osteoporosis- Screening begins at age 64 with bone density scanning, sooner if you are at higher risk for developing Osteoporosis.  Get these medicines  Calcium with Vitamin D- Your body requires 1200-1500 mg of Calcium a day and [phone] IU of Vitamin D a day.  You can only absorb 500 mg of Calcium at a time therefore Calcium must be taken in 2 or 3 separate doses throughout the  day.  Hormones- Hormone therapy has been associated with increased risk for certain cancers and heart disease.  Talk to your healthcare provider about if you need relief from menopausal symptoms.  Aspirin- Ask your healthcare provider about taking Aspirin to prevent Heart Disease and Stroke.  Get these Immuniztions  Flu shot- Every fall  Pneumonia shot- Once after the age of 104; if you are younger ask your healthcare provider if you need a pneumonia shot.  Tetanus- Every ten years.  Zostavax- Once after the age of 63 to prevent shingles.  Take these steps  Don't smoke- Your healthcare provider can help you quit. For tips on how to quit, ask your healthcare provider or go to www.smokefree.gov or call 1-800 QUIT-NOW.  Be physically active- Exercise 5 days a week for a minimum of 30 minutes.  If you are not already physically active, start slow and gradually work up to 30 minutes of moderate physical activity.  Try walking, dancing, bike riding, swimming, etc.  Eat a healthy diet- Eat a variety of healthy foods such as fruits, vegetables, whole grains, low fat milk, low fat cheeses, yogurt, lean meats, chicken, fish, eggs, dried beans, tofu, etc.  For more information go to www.thenutritionsource.org  Dental visit- Brush and floss teeth twice daily; visit your dentist twice a year.  Eye exam- Visit your Optometrist or Ophthalmologist yearly.  Drink alcohol in moderation- Limit alcohol intake to one drink or less a day.  Never drink and drive.  Depression- Your emotional health is as important as your physical  health.  If you're feeling down or losing interest in things you normally enjoy, please talk to your healthcare provider.  Seat Belts- can save your life; always wear one  Smoke/Carbon Monoxide detectors- These detectors need to be installed on the appropriate level of your home.  Replace batteries at least once a year.  Violence- If anyone is threatening or hurting you, please  tell your healthcare provider.  Living Will/ Health care power of attorney- Discuss with your healthcare provider and family.  Insulin Resistance Blood sugar (glucose) levels are controlled by a hormone called insulin. Insulin is made by your pancreas. When your blood glucose goes up, insulin is released into your blood. Insulin is required for your body to function normally. However, your body can become resistant to your own insulin or to insulin given to treat diabetes. In either case, insulin resistance can lead to serious problems. These problems include:  Type 2 diabetes.  Heart disease.  High blood pressure.  Stroke.  Polycystic ovary syndrome.  Fatty liver. CAUSES  Insulin resistance can develop for many different reasons. It is more likely to happen in people with these conditions or characteristics:  Obesity.  Inactivity.  Pregnancy.  High blood pressure.  Stress.  Steroid use.  Infection or severe illness.  Increased levels of cholesterol and triglycerides. SYMPTOMS  There are no symptoms. You may have symptoms related to the various complications of insulin resistance.  DIAGNOSIS  Several different things can make your caregiver suspect you have insulin resistance. These include:  High blood glucose (hyperglycemia).  Abnormal cholesterol levels.  High uric acid levels.  Changes related to blood pressure.  Changes related to inflammation. Insulin resistance can be determined with blood tests. An elevated insulin level when you have not eaten might suggest resistance. Other more complicated tests are sometimes necessary. TREATMENT  Lifestyle changes are the most important treatment for insulin resistance.   If you are overweight and you have insulin resistance, you can improve your insulin sensitivity by losing weight.  Moderate exercise for 30-40 minutes, 4 days a week, can improve insulin sensitivity. Some medicines can also help improve your  insulin sensitivity. Your caregiver can discuss these with you if they are appropriate.  HOME CARE INSTRUCTIONS   Do not smoke.  Keep your weight at a healthy level.  Get exercise.  If you have diabetes, follow your caregiver's directions.  If you have high blood pressure, follow your caregiver's directions.  Only take prescription medicines for pain, fever, or discomfort as directed by your caregiver. SEEK MEDICAL CARE IF:   You are diabetic and you are having problems keeping your blood glucose levels at target range.  You are having episodes of low blood glucose (hypoglycemia).  You feel you might be having side effects from your medicines.  You have symptoms of an illness that is not improving after 3-4 days.  You have a sore or wound that is not healing.  You notice a change in vision or a new problem with your vision. SEEK IMMEDIATE MEDICAL CARE IF:   Your blood glucose goes below 70, especially if you have confusion, lightheadedness, or other symptoms with it.  Your blood glucose is very high (as advised by your caregiver) twice in a row.  You pass out.  You have chest pain or trouble breathing.  You have a sudden, severe headache.  You have sudden weakness in one arm or one leg.  You have sudden difficulty speaking or swallowing.  You develop vomiting  or diarrhea that is getting worse or not improving after 1 day. Document Released: 11/03/2005 Document Revised: 03/15/2012 Document Reviewed: 02/23/2013 [facility] Patient Information 2015 [place], [place]. This information is not intended to replace advice given to you by your health care provider. Make sure you discuss any questions you have with your health care provider. How to Avoid Diabetes Problems You can do a lot to prevent or slow down diabetes problems. Following your diabetes plan and taking care of yourself can reduce your risk of serious or life-threatening complications. Below, you will find certain  things you can do to prevent diabetes problems. MANAGE YOUR DIABETES Follow your health care provider's, nurse educator's, and dietitian's instructions for managing your diabetes. They will teach you the basics of diabetes care. They can help answer questions you may have. Learn about diabetes and make healthy choices regarding eating and physical activity. Monitor your blood glucose level regularly. Your health care provider will help you decide how often to check your blood glucose level depending on your treatment goals and how well you are meeting them.  DO NOT USE NICOTINE Nicotine and diabetes are a dangerous combination. Nicotine raises your risk for diabetes problems. If you quit using nicotine, you will lower your risk for heart attack, stroke, nerve disease, and kidney disease. Your cholesterol and your blood pressure levels may improve. Your blood circulation will also improve. Do not use any tobacco products, including cigarettes, chewing tobacco, or electronic cigarettes. If you need help quitting, ask your health care provider. KEEP YOUR BLOOD PRESSURE UNDER CONTROL Keeping your blood pressure under control will help prevent damage to your eyes, kidneys, heart, and blood vessels. Blood pressure consists of two numbers. The top number should be below 120, and the bottom number should be below 80 (120/80). Keep your blood pressure as close to these numbers as you can. If you already have kidney disease, you may want even lower blood pressure to protect your kidneys. Talk to your health care provider to make sure that your blood pressure goal is right for your needs. Meal planning, medicines, and exercise can help you reach your blood pressure target. Have your blood pressure checked at every visit with your health care provider. KEEP YOUR CHOLESTEROL UNDER CONTROL Normal cholesterol levels will help prevent heart disease and stroke. These are the biggest health problems for people with diabetes.  Keeping cholesterol levels under control can also help with blood flow. Have your cholesterol level checked at least once a year. Your health care provider may prescribe a medicine known as a statin. Statins lower your cholesterol. If you are not taking a statin, ask your health care provider if you should be. Meal planning, exercise, and medicines can help you reach your cholesterol targets.  SCHEDULE AND KEEP YOUR ANNUAL PHYSICAL EXAMS AND EYE EXAMS Your health care provider will tell you how often he or she wants to see you depending on your plan of treatment. It is important that you keep these appointments so that possible problems can be identified early and complications can be avoided or treated.  Every visit with your health care provider should include your weight, blood pressure, and an evaluation of your blood glucose control.  Your hemoglobin A1c should be checked:  At least twice a year if you are at your goal.  Every 3 months if there are changes in treatment.  If you are not meeting your goals.  Your blood lipids should be checked yearly. You should also be checked  yearly to see if you have protein in your urine (microalbumin).  Schedule a dilated eye exam within 5 years of your diagnosis if you have type 1 diabetes, and then yearly. Schedule a dilated eye exam at diagnosis if you have type 2 diabetes, and then yearly. All exams thereafter can be extended to every 2 to 3 years if one or more exams have been normal. KEEP YOUR VACCINES CURRENT The flu vaccine is recommended yearly. The formula for the vaccine changes every year and needs to be updated for the best protection against current viruses. It is recommended that people with diabetes who are over 14 years old get the pneumonia vaccine. In some cases, two separate shots may be given. Ask your health care provider if your pneumonia vaccination is up-to-date. However, there are some instances where another vaccine is  recommended. Check with your health care provider. TAKE CARE OF YOUR FEET  Diabetes may cause you to have a poor blood supply (circulation) to your legs and feet. Because of this, the skin may be thinner, break easier, and heal more slowly. You also may have nerve damage in your legs and feet, causing decreased feeling. You may not notice minor injuries to your feet that could lead to serious problems or infections. Taking care of your feet is very important. Visual foot exams are performed at every routine medical visit. The exams check for cuts, injuries, or other problems with the feet. A comprehensive foot exam should be done yearly. This includes visual inspection as well as assessing foot pulses and testing for loss of sensation. You should also do the following:  Inspect your feet daily for cuts, calluses, blisters, ingrown toenails, and signs of infection, such as redness, swelling, or pus.  Wash and dry your feet thoroughly, especially between the toes.  Avoid soaking your feet regularly in hot water baths.  Moisturize dry skin with lotion, avoiding areas between your toes.  Cut toenails straight across and file the edges.  Avoid shoes that do not fit well or have areas that irritate your skin.  Avoid going barefooted or wearing only socks. Your feet need protection. TAKE CARE OF YOUR TEETH People with poorly controlled diabetes are more likely to have gum (periodontal) disease. These infections make diabetes harder to control. Periodontal diseases, if left untreated, can lead to tooth loss. Brush your teeth twice a day, floss, and see your dentist for checkups and cleaning every 6 months, or 2 times a year. ASK YOUR HEALTH CARE PROVIDER ABOUT TAKING ASPIRIN Taking aspirin daily is recommended to help prevent cardiovascular disease in people with and without diabetes. Ask your health care provider if this would benefit you and what dose he or she would recommend. DRINK  RESPONSIBLY Moderate amounts of alcohol (less than 1 drink per day for adult women and less than 2 drinks per day for adult men) have a minimal effect on blood glucose if ingested with food. It is important to eat food with alcohol to avoid hypoglycemia. People should avoid alcohol if they have a history of alcohol abuse or dependence, if they are pregnant, and if they have liver disease, pancreatitis, advanced neuropathy, or severe hypertriglyceridemia. LESSEN STRESS Living with diabetes can be stressful. When you are under stress, your blood glucose may be affected in two ways:  Stress hormones may cause your blood glucose to rise.  You may be distracted from taking good care of yourself. It is a good idea to be aware of your stress  level and make changes that are necessary to help you better manage challenging situations. Support groups, planned relaxation, a hobby you enjoy, meditation, healthy relationships, and exercise all work to lower your stress level. If your efforts do not seem to be helping, get help from your health care provider or a trained mental health professional. Document Released: 06/02/2011 Document Revised: 01/29/2014 Document Reviewed: 11/08/2013 [facility] Patient Information 2015 [place], [place]. This information is not intended to replace advice given to you by your health care provider. Make sure you discuss any questions you have with your health care provider. Diabetes Mellitus and Food It is important for you to manage your blood sugar (glucose) level. Your blood glucose level can be greatly affected by what you eat. Eating healthier foods in the appropriate amounts throughout the day at about the same time each day will help you control your blood glucose level. It can also help slow or prevent worsening of your diabetes mellitus. Healthy eating may even help you improve the level of your blood pressure and reach or maintain a healthy weight.  HOW CAN FOOD AFFECT  ME? Carbohydrates Carbohydrates affect your blood glucose level more than any other type of food. Your dietitian will help you determine how many carbohydrates to eat at each meal and teach you how to count carbohydrates. Counting carbohydrates is important to keep your blood glucose at a healthy level, especially if you are using insulin or taking certain medicines for diabetes mellitus. Alcohol Alcohol can cause sudden decreases in blood glucose (hypoglycemia), especially if you use insulin or take certain medicines for diabetes mellitus. Hypoglycemia can be a life-threatening condition. Symptoms of hypoglycemia (sleepiness, dizziness, and disorientation) are similar to symptoms of having too much alcohol.  If your health care provider has given you approval to drink alcohol, do so in moderation and use the following guidelines:  Women should not have more than one drink per day, and men should not have more than two drinks per day. One drink is equal to:  12 oz of beer.  5 oz of wine.  1 oz of hard liquor.  Do not drink on an empty stomach.  Keep yourself hydrated. Have water, diet soda, or unsweetened iced tea.  Regular soda, juice, and other mixers might contain a lot of carbohydrates and should be counted. WHAT FOODS ARE NOT RECOMMENDED? As you make food choices, it is important to remember that all foods are not the same. Some foods have fewer nutrients per serving than other foods, even though they might have the same number of calories or carbohydrates. It is difficult to get your body what it needs when you eat foods with fewer nutrients. Examples of foods that you should avoid that are high in calories and carbohydrates but low in nutrients include:  Trans fats (most processed foods list trans fats on the Nutrition Facts label).  Regular soda.  Juice.  Candy.  Sweets, such as cake, pie, doughnuts, and cookies.  Fried foods. WHAT FOODS CAN I EAT? Have nutrient-rich  foods, which will nourish your body and keep you healthy. The food you should eat also will depend on several factors, including:  The calories you need.  The medicines you take.  Your weight.  Your blood glucose level.  Your blood pressure level.  Your cholesterol level. You also should eat a variety of foods, including:  Protein, such as meat, poultry, fish, tofu, nuts, and seeds (lean animal proteins are best).  Fruits.  Vegetables.  Dairy  products, such as milk, cheese, and yogurt (low fat is best).  Breads, grains, pasta, cereal, rice, and beans.  Fats such as olive oil, trans fat-free margarine, canola oil, avocado, and olives. DOES EVERYONE WITH DIABETES MELLITUS HAVE THE SAME MEAL PLAN? Because every person with diabetes mellitus is different, there is not one meal plan that works for everyone. It is very important that you meet with a dietitian who will help you create a meal plan that is just right for you. Document Released: 06/11/2005 Document Revised: 09/19/2013 Document Reviewed: 08/11/2013 [facility] Patient Information 2015 [place], [place]. This information is not intended to replace advice given to you by your health care provider. Make sure you discuss any questions you have with your health care provider.

## 2014-06-26 ENCOUNTER — Encounter: Payer: Self-pay | Admitting: Family Medicine

## 2014-07-06 ENCOUNTER — Ambulatory Visit (INDEPENDENT_AMBULATORY_CARE_PROVIDER_SITE_OTHER): Payer: Worker's Compensation | Admitting: Family Medicine

## 2014-07-06 ENCOUNTER — Encounter: Payer: Self-pay | Admitting: Family Medicine

## 2014-07-06 VITALS — BP 142/83 | HR 86 | Ht 66.0 in | Wt 240.0 lb

## 2014-07-06 DIAGNOSIS — M75101 Unspecified rotator cuff tear or rupture of right shoulder, not specified as traumatic: Secondary | ICD-10-CM

## 2014-07-06 NOTE — Progress Notes (Signed)
  Katherine BrayDarlene D [name] - 58 y.o. female MRN [id number]  Date of birth: November 12, 1955  CC & HPI:  The patient presents as new patient for re-evaluation of: Right shoulder pain: She's had persistent right shoulder pain and difficulty with abduction and overhead motion. She has undergone an MRI of her shoulder however we do not have records of this. She also is unsure if she has followup with Dr. Dion SaucierLandau as she is referred to him last July.  She currently denies any numbness, tingling or pain out of proportion in the right upper extremity. She has been performing therapeutic exercises and taking anti-inflammatories in the past but not doing so recently. Pain is keeping her up at night. 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  Patient has now retired but this is continuing workers compensation case  notes from initial visit are reviewed: Patient was at work walking down a flight of stairs with her right hand on the banister. She started to make the turn for the second flight of stairs her foot slipped on the stair and she almost fell, grabbing the banister with her right hand to steady herself. She felt an immediate sharp pain in her right shoulder. Since then she's had continued pain, particularly at night and with certain motions. Overhead reaching or trying to pick up something her most painful. She is right-hand dominant. She's had no numbness or tingling in her hands   PERTINENT PMH / PSH:  No prior shoulder injury, no history of shoulder surgeries, no neck surgeries  ROS:  Per HPI.   DATA REVIEWED: MRI Completed but unavailable for review  OBJECTIVE FINDINGS:  VS:   HT:5\' 6"  (167.6 cm)   WT:240 lb (108.863 kg)  BMI:38.8          BP:142/83 mmHg  HR:86bpm  TEMP: ( )  RESP:   PHYSICAL EXAM: GENERAL: Adult Obese African American  female. In no discomfort; no respiratory distress     PSYCH: alert and appropriate, good insight   NEURO: Sensation is intact to light touch in UEs  VASCULAR: Radial pulses 2+/4.  No significant edema.    R Shoulder EXAM: Appearance: Normal appearing, no significant deformity  Skin: No overlying erythema/ecchymosis.  Palpation: TTP over: Anterior shoulder  No TTP over: AC joint  Strength & ROM: 5/5 Strength and full active ROM   Limited ROM in: internal rotation to L5; left to T10 Weakness in: abduction  Special Tests:     ASSESSMENT: Shoulder pain--WORKERS COMPENSATION case   PLAN: See problem based charting & AVS for additional documentation.

## 2014-07-11 NOTE — Progress Notes (Signed)
Patient ID: Katherine [name], female   DOB: [birth year], 58 y.o.   MR[facility]: [id number] [facility] Attending Note: I have reviewed the chart, discussed wit the Sports Medicine Fellow. I agree with assessment and treatment plan as detailed in the Fellow's note. Unclear to me why patient is rweturning to see us. We had referred her to orthopedist--we called their office today asking for notes as they do have record of her having a visit in July which she does not recall with any detail. Will await records.

## 2014-07-11 NOTE — Assessment & Plan Note (Signed)
Shoulder pain We have no notes from orthopedist who she evidently saw in July and she does not recall viasit with them in any detail. We will send for records--she has filled out ROR form today and we have faxed it over to ortho.

## 2014-07-16 ENCOUNTER — Encounter: Payer: Self-pay | Admitting: Family Medicine

## 2014-07-16 ENCOUNTER — Encounter: Payer: Self-pay | Admitting: *Deleted

## 2014-07-16 NOTE — Progress Notes (Signed)
Patient ID: Katherine [name], female   DOB: 04-16-1956, 58 y.o.   MRN: [id number] Talked to kelly at workers comp and she has approved a visit for pt to go and see dr Dion Saucierlandau to determine if surgery is needed. Tresa EndoKelly wants to be called back by dr Dion Saucierlandau if surgery is need to get the approval by her at [phone]. Case number is [id number]. Talked with Lupita Leashonna at dr landau's office and she is informed of this and will call pt with appt time and date.

## 2014-07-20 ENCOUNTER — Encounter: Payer: BC Managed Care – PPO | Admitting: Family Medicine

## 2014-07-29 HISTORY — PX: OTHER SURGICAL HISTORY: SHX169

## 2014-07-30 ENCOUNTER — Encounter: Payer: Self-pay | Admitting: Family Medicine

## 2014-09-30 ENCOUNTER — Other Ambulatory Visit: Payer: Self-pay | Admitting: Family Medicine

## 2014-10-08 ENCOUNTER — Other Ambulatory Visit: Payer: Self-pay | Admitting: Family Medicine

## 2015-01-01 ENCOUNTER — Ambulatory Visit (INDEPENDENT_AMBULATORY_CARE_PROVIDER_SITE_OTHER): Payer: BC Managed Care – PPO | Admitting: Family Medicine

## 2015-01-01 VITALS — BP 116/74 | HR 72 | Temp 97.9°F | Resp 20 | Ht 65.0 in | Wt 249.5 lb

## 2015-01-01 DIAGNOSIS — H1013 Acute atopic conjunctivitis, bilateral: Secondary | ICD-10-CM | POA: Diagnosis not present

## 2015-01-01 DIAGNOSIS — J309 Allergic rhinitis, unspecified: Principal | ICD-10-CM

## 2015-01-01 MED ORDER — ERYTHROMYCIN 5 MG/GM OP OINT: 1.0000 "application " | TOPICAL_OINTMENT | Freq: Four times a day (QID) | OPHTHALMIC | Status: DC

## 2015-01-01 MED ORDER — AZELASTINE HCL 0.05 % OP SOLN: 1.0000 [drp] | Freq: Two times a day (BID) | OPHTHALMIC | Status: DC

## 2015-01-01 MED ORDER — CETIRIZINE HCL 10 MG PO TABS: 10.0000 mg | ORAL_TABLET | Freq: Every day | ORAL | Status: DC

## 2015-01-01 NOTE — Patient Instructions (Signed)
Allergic Conjunctivitis The conjunctiva is a thin membrane that covers the visible white part of the eyeball and the underside of the eyelids. This membrane protects and lubricates the eye. The membrane has small blood vessels running through it that can normally be seen. When the conjunctiva becomes inflamed, the condition is called conjunctivitis. In response to the inflammation, the conjunctival blood vessels become swollen. The swelling results in redness in the normally white part of the eye. The blood vessels of this membrane also react when a person has allergies and is then called allergic conjunctivitis. This condition usually lasts for as long as the allergy persists. Allergic conjunctivitis cannot be passed to another person (non-contagious). The likelihood of bacterial infection is great and the cause is not likely due to allergies if the inflamed eye has:  A sticky discharge.  Discharge or sticking together of the lids in the morning.  Scaling or flaking of the eyelids where the eyelashes come out.  Red swollen eyelids. CAUSES   Viruses.  Irritants such as foreign bodies.  Chemicals.  General allergic reactions.  Inflammation or serious diseases in the inside or the outside of the eye or the orbit (the boney cavity in which the eye sits) can cause a "red eye." SYMPTOMS   Eye redness.  Tearing.  Itchy eyes.  Burning feeling in the eyes.  Clear drainage from the eye.  Allergic reaction due to pollens or ragweed sensitivity. Seasonal allergic conjunctivitis is frequent in the spring when pollens are in the air and in the fall. DIAGNOSIS  This condition, in its many forms, is usually diagnosed based on the history and an ophthalmological exam. It usually involves both eyes. If your eyes react at the same time every year, allergies may be the cause. While most "red eyes" are due to allergy or an infection, the role of an eye (ophthalmological) exam is important. The  exam can rule out serious diseases of the eye or orbit. TREATMENT   Non-antibiotic eye drops, ointments, or medications by mouth may be prescribed if the ophthalmologist is sure the conjunctivitis is due to allergies alone.  Over-the-counter drops and ointments for allergic symptoms should be used only after other causes of conjunctivitis have been ruled out, or as your caregiver suggests. Medications by mouth are often prescribed if other allergy-related symptoms are present. If the ophthalmologist is sure that the conjunctivitis is due to allergies alone, treatment is normally limited to drops or ointments to reduce itching and burning. HOME CARE INSTRUCTIONS   Wash hands before and after applying drops or ointments, or touching the inflamed eye(s) or eyelids.  Do not let the eye dropper tip or ointment tube touch the eyelid when putting medicine in your eye.  Stop using your soft contact lenses and throw them away. Use a new pair of lenses when recovery is complete. You should run through sterilizing cycles at least three times before use after complete recovery if the old soft contact lenses are to be used. Hard contact lenses should be stopped. They need to be thoroughly sterilized before use after recovery.  Itching and burning eyes due to allergies is often relieved by using a cool cloth applied to closed eye(s). SEEK MEDICAL CARE IF:   Your problems do not go away after two or three days of treatment.  Your lids are sticky (especially in the morning when you wake up) or stick together.  Discharge develops. Antibiotics may be needed either as drops, ointment, or by mouth.  You have extreme light sensitivity.  An oral temperature above 102 F (38.9 C) develops.  Pain in or around the eye or any other visual symptom develops. MAKE SURE YOU:   Understand these instructions.  Will watch your condition.  Will get help right away if you are not doing well or get worse. Document  Released: 12/05/2002 Document Revised: 12/07/2011 Document Reviewed: 10/31/2007 [facility] Patient Information 2015 [place], [place]. This information is not intended to replace advice given to you by your health care provider. Make sure you discuss any questions you have with your health care provider.   Hay Fever Hay fever is an allergic reaction to particles in the air. It cannot be passed from person to person. It cannot be cured, but it can be controlled. CAUSES  Hay fever is caused by something that triggers an allergic reaction (allergens). The following are examples of allergens:  Ragweed.  Feathers.  Animal dander.  Grass and tree pollens.  Cigarette smoke.  House dust.  Pollution. SYMPTOMS   Sneezing.  Runny or stuffy nose.  Tearing eyes.  Itchy eyes, nose, mouth, throat, skin, or other area.  Sore throat.  Headache.  Decreased sense of smell or taste. DIAGNOSIS Your caregiver will perform a physical exam and ask questions about the symptoms you are having.Allergy testing may be done to determine exactly what triggers your hay fever.  TREATMENT   Over-the-counter medicines may help symptoms. These include:  Antihistamines.  Decongestants. These may help with nasal congestion.  Your caregiver may prescribe medicines if over-the-counter medicines do not work.  Some people benefit from allergy shots when other medicines are not helpful. HOME CARE INSTRUCTIONS   Avoid the allergen that is causing your symptoms, if possible.  Take all medicine as told by your caregiver. SEEK MEDICAL CARE IF:   You have severe allergy symptoms and your current medicines are not helping.  Your treatment was working at one time, but you are now experiencing symptoms.  You have sinus congestion and pressure.  You develop a fever or headache.  You have thick nasal discharge.  You have asthma and have a worsening cough and wheezing. SEEK IMMEDIATE MEDICAL CARE IF:    You have swelling of your tongue or lips.  You have trouble breathing.  You feel lightheaded or like you are going to faint.  You have cold sweats.  You have a fever. Document Released: 09/14/2005 Document Revised: 12/07/2011 Document Reviewed: 12/10/2010 [facility] Patient Information 2015 [place], [place]. This information is not intended to replace advice given to you by your health care provider. Make sure you discuss any questions you have with your health care provider. Managing Your High Blood Pressure Blood pressure is a measurement of how forceful your blood is pressing against the walls of the arteries. Arteries are muscular tubes within the circulatory system. Blood pressure does not stay the same. Blood pressure rises when you are active, excited, or nervous; and it lowers during sleep and relaxation. If the numbers measuring your blood pressure stay above normal most of the time, you are at risk for health problems. High blood pressure (hypertension) is a long-term (chronic) condition in which blood pressure is elevated. A blood pressure reading is recorded as two numbers, such as 120 over 80 (or 120/80). The first, higher number is called the systolic pressure. It is a measure of the pressure in your arteries as the heart beats. The second, lower number is called the diastolic pressure. It is a measure of the  pressure in your arteries as the heart relaxes between beats.  Keeping your blood pressure in a normal range is important to your overall health and prevention of health problems, such as heart disease and stroke. When your blood pressure is uncontrolled, your heart has to work harder than normal. High blood pressure is a very common condition in adults because blood pressure tends to rise with age. Men and women are equally likely to have hypertension but at different times in life. Before age 65, men are more likely to have hypertension. After 61 years of age, women are more  likely to have it. Hypertension is especially common in African Americans. This condition often has no signs or symptoms. The cause of the condition is usually not known. Your caregiver can help you come up with a plan to keep your blood pressure in a normal, healthy range. BLOOD PRESSURE STAGES Blood pressure is classified into four stages: normal, prehypertension, stage 1, and stage 2. Your blood pressure reading will be used to determine what type of treatment, if any, is necessary. Appropriate treatment options are tied to these four stages:  Normal  Systolic pressure (mm Hg): below 120.  Diastolic pressure (mm Hg): below 80. Prehypertension  Systolic pressure (mm Hg): 120 to 139.  Diastolic pressure (mm Hg): 80 to 89. Stage1  Systolic pressure (mm Hg): 140 to 159.  Diastolic pressure (mm Hg): 90 to 99. Stage2  Systolic pressure (mm Hg): 160 or above.  Diastolic pressure (mm Hg): 100 or above. RISKS RELATED TO HIGH BLOOD PRESSURE Managing your blood pressure is an important responsibility. Uncontrolled high blood pressure can lead to:  A heart attack.  A stroke.  A weakened blood vessel (aneurysm).  Heart failure.  Kidney damage.  Eye damage.  Metabolic syndrome.  Memory and concentration problems. HOW TO MANAGE YOUR BLOOD PRESSURE Blood pressure can be managed effectively with lifestyle changes and medicines (if needed). Your caregiver will help you come up with a plan to bring your blood pressure within a normal range. Your plan should include the following: Education  Read all information provided by your caregivers about how to control blood pressure.  Educate yourself on the latest guidelines and treatment recommendations. New research is always being done to further define the risks and treatments for high blood pressure. Lifestylechanges  Control your weight.  Avoid smoking.  Stay physically active.  Reduce the amount of salt in your  diet.  Reduce stress.  Control any chronic conditions, such as high cholesterol or diabetes.  Reduce your alcohol intake. Medicines  Several medicines (antihypertensive medicines) are available, if needed, to bring blood pressure within a normal range. Communication  Review all the medicines you take with your caregiver because there may be side effects or interactions.  Talk with your caregiver about your diet, exercise habits, and other lifestyle factors that may be contributing to high blood pressure.  See your caregiver regularly. Your caregiver can help you create and adjust your plan for managing high blood pressure. RECOMMENDATIONS FOR TREATMENT AND [name]  The following recommendations are based on current guidelines for managing high blood pressure in nonpregnant adults. Use these recommendations to identify the proper follow-up period or treatment option based on your blood pressure reading. You can discuss these options with your caregiver.  Systolic pressure of 120 to 139 or diastolic pressure of 80 to 89: Follow up with your caregiver as directed.  Systolic pressure of 140 to 160 or diastolic pressure of 90 to 100:  Follow up with your caregiver within 2 months.  Systolic pressure above 160 or diastolic pressure above 100: Follow up with your caregiver within 1 month.  Systolic pressure above 180 or diastolic pressure above 110: Consider antihypertensive therapy; follow up with your caregiver within 1 week.  Systolic pressure above 200 or diastolic pressure above 120: Begin antihypertensive therapy; follow up with your caregiver within 1 week. Document Released: 06/08/2012 Document Reviewed: 06/08/2012 [facility] Patient Information 2015 [place]. This information is not intended to replace advice given to you by your health care provider. Make sure you discuss any questions you have with your health care provider.

## 2015-01-01 NOTE — Progress Notes (Signed)
Subjective:  This chart was scribed for [name] MD,  by Veverly Fells, at [facility].  This patient was seen in room 14 and the patient's care was started at 12:40 PM.   Chief Complaint  Patient presents with  . Conjunctivitis    left eye red and itching.  started this morning when she woke up      Patient ID: Katherine [name], female    DOB: Jul 07, 1956, 59 y.o.   MRN: [id number]  HPI  HPI Comments: Katherine [name] is a 59 y.o. female who presents to [facility] for itching in her eyes bilaterally(left worse than right).  Patient has associated symptoms of light yellow drainage.  Patient wears glasses and does not use contacts.  Patient denies congestion, cough, sore throat, or changes in vision. Patient notes her grandson had pink eye a couple weeks ago.  Patient keeps track of her blood pressure at home and has questions about her blood pressure medication dosage.  She denies any leg swelling, bowel movement changes or incontinence.  Patient had rotator cuff surgery in November and attends physical therapy for it.  She states she has difficulty closing her hand at times. Patient has no other complaints today.        Past Medical History  Diagnosis Date  . Hypertension     Current Outpatient Prescriptions on File Prior to Visit  Medication Sig Dispense Refill  . amLODipine (NORVASC) 5 MG tablet Take 1 tablet (5 mg total) by mouth daily. 90 tablet 3  . hydrochlorothiazide (MICROZIDE) 12.5 MG capsule Take 1 capsule (12.5 mg total) by mouth daily. 90 capsule 3  . meloxicam (MOBIC) 15 MG tablet TAKE ONE TABLET BY MOUTH ONCE DAILY *DO  NOT  USE  WITH  ANY  OTHER  OTC  PAIN  MEDICATION  OTHER  THAN  TYLENOL/ACETAMINOPHEN* 30 tablet 2  . ranitidine (ZANTAC) 150 MG tablet TAKE ONE TABLET BY MOUTH TWICE DAILY AS NEEDED FOR  HEARTBURN 60 tablet 2   No current facility-administered medications on file prior to visit.    Allergies    Allergen Reactions  . Ace Inhibitors Cough  . Adhesive [Tape] Rash    Review of Systems  Constitutional: Negative for fever and chills.  HENT: Negative for congestion, drooling, nosebleeds, postnasal drip and sore throat.   Eyes: Positive for discharge, redness and itching. Negative for visual disturbance.  Musculoskeletal: Negative for joint swelling.       Objective:   Physical Exam  Constitutional: She is oriented to person, place, and time. She appears well-developed and well-nourished. No distress.  HENT:  Head: Normocephalic and atraumatic.  Right Ear: Hearing and tympanic membrane normal.  Left Ear: Hearing and tympanic membrane normal.  TM's normal bilaterally Nasal mucosa erythematous and edematous.   oropharynx :erythema.   Eyes: EOM are normal. Right eye exhibits no chemosis. Left eye exhibits no chemosis. Right conjunctiva is injected. Left conjunctiva is injected.  Mild injection of the conjuctiva bilaterally  Normal funduscopic exam Mild increase in tearing bilaterally.       Neck: Neck supple.  Cardiovascular: Normal rate, regular rhythm, S1 normal, S2 normal and normal heart sounds.   No murmur heard. Pulmonary/Chest: Effort normal. No respiratory distress.  Musculoskeletal: Normal range of motion.  Neurological: She is alert and oriented to person, place, and time.  Skin: Skin is warm and dry.  Psychiatric: She has a normal mood and affect. Her behavior is  normal.  Nursing note and vitals reviewed.     BP 116/74 mmHg  Pulse 72  Temp(Src) 97.9 F (36.6 C) (Oral)  Resp 20  Ht 5\' 5"  (1.651 m)  Wt 249 lb 8 oz (113.172 kg)  BMI 41.52 kg/m2  SpO2 98%        Assessment & Plan:  Warm compresses on both eyes 5 times a day   Allergic conjunctivitis and rhinitis, bilateral  Meds ordered this encounter  Medications  . cetirizine (ZYRTEC) 10 MG tablet    Sig: Take 1 tablet (10 mg total) by mouth at bedtime.    Dispense:  30 tablet    Refill:  11   . azelastine (OPTIVAR) 0.05 % ophthalmic solution    Sig: Place 1 drop into both eyes 2 (two) times daily.    Dispense:  6 mL    Refill:  1  . erythromycin (ROMYCIN) ophthalmic ointment    Sig: Place 1 application into both eyes 4 (four) times daily.    Dispense:  3.5 g    Refill:  0    I personally performed the services described in this documentation, which was scribed in my presence. The recorded information has been reviewed and considered, and addended by me as needed.  Norberto SorensonEva Syleena Mchan, MD MPH

## 2015-01-03 ENCOUNTER — Other Ambulatory Visit: Payer: Self-pay

## 2015-01-03 DIAGNOSIS — Z1231 Encounter for screening mammogram for malignant neoplasm of breast: Secondary | ICD-10-CM

## 2015-01-05 ENCOUNTER — Telehealth: Payer: Self-pay | Admitting: Family Medicine

## 2015-01-05 NOTE — Telephone Encounter (Signed)
Patient will take her last pill on Monday. (Meloxicam)

## 2015-01-08 NOTE — Telephone Encounter (Signed)
This was done yesterday.  

## 2015-01-21 ENCOUNTER — Telehealth: Payer: Self-pay

## 2015-01-21 MED ORDER — POLYMYXIN B-TRIMETHOPRIM 10000-0.1 UNIT/ML-% OP SOLN: 1.0000 [drp] | Freq: Four times a day (QID) | OPHTHALMIC | Status: DC

## 2015-01-21 NOTE — Telephone Encounter (Signed)
lmom that we had called in eye drops.

## 2015-01-21 NOTE — Telephone Encounter (Signed)
Can we rx her drops instead?

## 2015-01-21 NOTE — Telephone Encounter (Signed)
Pt was prescribed erithromycin ointment at her last visit and she is requesting the "drops" instead.

## 2015-01-21 NOTE — Telephone Encounter (Signed)
Polytrim eye drops sent in.

## 2015-02-04 ENCOUNTER — Ambulatory Visit: Payer: Self-pay

## 2015-02-06 ENCOUNTER — Telehealth: Payer: Self-pay

## 2015-02-06 MED ORDER — MELOXICAM 15 MG PO TABS: ORAL_TABLET | ORAL | Status: DC

## 2015-02-06 NOTE — Telephone Encounter (Signed)
lmom that rx called in but she needs to call and schedule CPE within 6 months.

## 2015-02-06 NOTE — Telephone Encounter (Signed)
Dr. Clelia CroftShaw advise on refill. Does she need to be seen?

## 2015-02-06 NOTE — Telephone Encounter (Signed)
Pt is overdue for an appt for recheck of labs on kidney function, prediabetes, fasting lipids - have her sched a CPE within the next 6 months.  meloxicam refilled.

## 2015-02-06 NOTE — Telephone Encounter (Signed)
Pt needs rx for meloxicam. She says she was here about 1 month ago but the pharmacy says they cannot refill until she comes to see the doctor.

## 2015-02-07 ENCOUNTER — Ambulatory Visit
Admission: RE | Admit: 2015-02-07 | Discharge: 2015-02-07 | Disposition: A | Discharge status: Home / Self Care | Payer: BC Managed Care – PPO | Source: Ambulatory Visit

## 2015-02-07 DIAGNOSIS — Z1231 Encounter for screening mammogram for malignant neoplasm of breast: Secondary | ICD-10-CM

## 2015-04-19 ENCOUNTER — Encounter: Payer: Self-pay | Admitting: Family Medicine

## 2015-04-19 ENCOUNTER — Ambulatory Visit (INDEPENDENT_AMBULATORY_CARE_PROVIDER_SITE_OTHER): Payer: BC Managed Care – PPO | Admitting: Family Medicine

## 2015-04-19 DIAGNOSIS — E119 Type 2 diabetes mellitus without complications: Secondary | ICD-10-CM

## 2015-04-19 DIAGNOSIS — I10 Essential (primary) hypertension: Secondary | ICD-10-CM | POA: Diagnosis not present

## 2015-04-19 DIAGNOSIS — Z113 Encounter for screening for infections with a predominantly sexual mode of transmission: Secondary | ICD-10-CM

## 2015-04-19 DIAGNOSIS — R7309 Other abnormal glucose: Secondary | ICD-10-CM

## 2015-04-19 DIAGNOSIS — E669 Obesity, unspecified: Secondary | ICD-10-CM

## 2015-04-19 DIAGNOSIS — R7303 Prediabetes: Secondary | ICD-10-CM

## 2015-04-19 LAB — COMPREHENSIVE METABOLIC PANEL
ALBUMIN: 4.2 g/dL (ref 3.5–5.2)
ALK PHOS: 98 U/L (ref 39–117)
ALT: 20 U/L (ref 0–35)
AST: 23 U/L (ref 0–37)
BILIRUBIN TOTAL: 0.8 mg/dL (ref 0.2–1.2)
BUN: 11 mg/dL (ref 6–23)
CO2: 26 mEq/L (ref 19–32)
CREATININE: 0.86 mg/dL (ref 0.50–1.10)
Calcium: 9.2 mg/dL (ref 8.4–10.5)
Chloride: 105 mEq/L (ref 96–112)
Glucose, Bld: 113 mg/dL — ABNORMAL HIGH (ref 70–99)
Potassium: 3.9 mEq/L (ref 3.5–5.3)
SODIUM: 141 meq/L (ref 135–145)
Total Protein: 7.6 g/dL (ref 6.0–8.3)

## 2015-04-19 LAB — LIPID PANEL
CHOLESTEROL: 198 mg/dL (ref 0–200)
HDL: 41 mg/dL — ABNORMAL LOW (ref >=46)
LDL Cholesterol: 133 mg/dL — ABNORMAL HIGH (ref 0–99)
TRIGLYCERIDES: 118 mg/dL (ref <150)
Total CHOL/HDL Ratio: 4.8 Ratio
VLDL: 24 mg/dL (ref 0–40)

## 2015-04-19 LAB — POCT GLYCOSYLATED HEMOGLOBIN (HGB A1C): Hemoglobin A1C: 6.7

## 2015-04-19 MED ORDER — MELOXICAM 15 MG PO TABS: ORAL_TABLET | ORAL | Status: DC

## 2015-04-19 MED ORDER — RANITIDINE HCL 150 MG PO TABS: ORAL_TABLET | ORAL | Status: DC

## 2015-04-19 MED ORDER — METFORMIN HCL 500 MG PO TABS: 500.0000 mg | ORAL_TABLET | Freq: Every day | ORAL | Status: DC

## 2015-04-19 MED ORDER — AMLODIPINE BESYLATE 5 MG PO TABS: 5.0000 mg | ORAL_TABLET | Freq: Every day | ORAL | Status: DC

## 2015-04-19 NOTE — Progress Notes (Addendum)
Subjective:  This chart was scribed for [name], MD by Andrew Au, ED Scribe. This patient was seen in room 24 and the patient's care was started at 2:33 PM.   Patient ID: Katherine [name], female    DOB: 06-04-1956, 59 y.o.   MRN: [id number]  HPI Chief Complaint  Patient presents with  . Medication Refill   HPI Comments: Katherine [name] is a 59 y.o. female who presents to the [facility] for a medication refill.   Pt has improving right shoulder pain. She no longer has physical therapy and is supposed to be receiving at home equipment for stretching her shoulder. She had rotator cuff surgery in November 2015.  Pre-DM last checked 05/2014 hgba1c was 6.1%  She does not eat a lot of rice potatoes, pasta and processed white sugar. The last thing that she ate was BBQ chicken, mac and cheese, collard greens and water melon for dessert. In the morning she usually has a smoothie with strawberries and spinach. She has not been exercising and does not have a gym membership. She has an elliptical, stationary bicycle and treadmill at home but has not used this equipment in the past month. She has right heel pain in morning which remind of her of when she had heel spurs. She has inserts for her shoes .    She has had some right hand stiffness in the morning.  She denies having joint pain. She denies leg cramps. She began taking Vitamin D 2 weeks ago.    Pt is fasting.   Past Medical History  Diagnosis Date  . Hypertension    Past Surgical History  Procedure Laterality Date  . Cholecystectomy    . Rotator cuff surgery     Prior to Admission medications   Medication Sig Start Date End Date Taking? Authorizing Provider  amLODipine (NORVASC) 5 MG tablet Take 1 tablet (5 mg total) by mouth daily. 06/23/14  Yes Sherren Mocha, MD  meloxicam (MOBIC) 15 MG tablet TAKE 1 TABLET BY MOUTH ONCE DAILY "OV NEEDED for labs" 02/06/15  Yes Sherren Mocha, MD  ranitidine (ZANTAC) 150 MG tablet  TAKE ONE TABLET BY MOUTH TWICE DAILY AS NEEDED FOR  HEARTBURN 10/09/14  Yes Sherren Mocha, MD  vitamin B-12 (CYANOCOBALAMIN) 500 MCG tablet Take 500 mcg by mouth daily.   Yes Historical Provider, MD   Past Surgical History  Procedure Laterality Date  . Cholecystectomy    . Rotator cuff surgery     Family History  Problem Relation Age of Onset  . Diabetes Father   . Hypertension Father   . Sickle cell anemia Brother   . Cancer Maternal Grandmother     breast cancer  . Cancer Brother 15    Leukemia, passed away at 77   History   Social History  . Marital Status: Married    Spouse Name: N/A  . Number of Children: N/A  . Years of Education: Masters    Occupational History  . Teacher    Social History Main Topics  . Smoking status: Never Smoker   . Smokeless tobacco: Never Used  . Alcohol Use: 0.0 oz/week    0 Standard drinks or equivalent per week     Comment: social  . Drug Use: No  . Sexual Activity: Yes   Other Topics Concern  . None   Social History Narrative   Patient has had 2 pregnancies and has 2 living children.  Depression screen [facility] 2/9 04/19/2015 07/06/2014  Decreased Interest 0 0  Down, Depressed, Hopeless 0 0  PHQ - 2 Score 0 0     Review of Systems  Constitutional: Positive for activity change. Negative for fever, chills, diaphoresis and appetite change.  Eyes: Negative for visual disturbance.  Respiratory: Negative for cough and shortness of breath.   Cardiovascular: Negative for chest pain, palpitations and leg swelling.  Genitourinary: Negative for decreased urine volume.  Musculoskeletal: Positive for myalgias, joint swelling, arthralgias and gait problem.  Skin: Negative for color change and rash.  Neurological: Negative for syncope, weakness, numbness and headaches.  Hematological: Does not bruise/bleed easily.  Psychiatric/Behavioral: Negative for dysphoric mood. The patient is not nervous/anxious.    Objective:   Physical Exam    Constitutional: She is oriented to person, place, and time. She appears well-developed and well-nourished. No distress.  HENT:  Head: Normocephalic and atraumatic.  Eyes: Conjunctivae and EOM are normal.  Neck: Neck supple.  Cardiovascular: Normal rate.   Pulmonary/Chest: Effort normal.  Musculoskeletal: Normal range of motion.  Neurological: She is alert and oriented to person, place, and time.  Skin: Skin is warm and dry.  Psychiatric: She has a normal mood and affect. Her behavior is normal.  Nursing note and vitals reviewed.  Filed Vitals:   04/19/15 1414  BP: 131/79  Pulse: 84  Temp: 97.6 F (36.4 C)  Resp: 16  Height: 5\' 5"  (1.651 m)  Weight: 242 lb (109.77 kg)    Results for orders placed or performed in visit on 04/19/15  POCT glycosylated hemoglobin (Hb A1C)  Result Value Ref Range   Hemoglobin A1C 6.7    Assessment & Plan:      1. Prediabetes   2. Essential hypertension   3. Obesity (BMI 35.0-39.9 without comorbidity)   4. Screening for STD (sexually transmitted disease)   5. Type 2 diabetes mellitus without complication - new diagnosis today w/ a1c 6.7 up from 6.1 last yr - start metformin, reviewed diet.  Recheck in 3-4 mos and rx glucometer and dm education referral if  a1c no sig improved w/ tlc.    Orders Placed This Encounter  Procedures  . Comprehensive metabolic panel    Order Specific Question:  Has the patient fasted?    Answer:  Yes  . Lipid panel    Order Specific Question:  Has the patient fasted?    Answer:  Yes  . HIV antibody  . POCT glycosylated hemoglobin (Hb A1C)    Meds ordered this encounter  Medications  . vitamin B-12 (CYANOCOBALAMIN) 500 MCG tablet    Sig: Take 500 mcg by mouth daily.  . ranitidine (ZANTAC) 150 MG tablet    Sig: TAKE ONE TABLET BY MOUTH TWICE DAILY AS NEEDED FOR  HEARTBURN    Dispense:  60 tablet    Refill:  11  . meloxicam (MOBIC) 15 MG tablet    Sig: TAKE 1 TABLET BY MOUTH ONCE DAILY as needed for joint  pain    Dispense:  30 tablet    Refill:  5  . amLODipine (NORVASC) 5 MG tablet    Sig: Take 1 tablet (5 mg total) by mouth daily.    Dispense:  90 tablet    Refill:  3  . metFORMIN (GLUCOPHAGE) 500 MG tablet    Sig: Take 1 tablet (500 mg total) by mouth daily with breakfast. And with dinner if large or full of carbs/sugar    Dispense:  180 tablet  Refill:  1    I personally performed the services described in this documentation, which was scribed in my presence. The recorded information has been reviewed and considered, and addended by me as needed.  [name], MD MPH

## 2015-04-19 NOTE — Patient Instructions (Addendum)
Pick up a gummy with 400-600u of calcium and about 600-800u of vitamin D to take once a day at a different time than your multivitamin.   Start glucosamine-chondroiton - high dose is best >1200mg  of each once a day in the morning with your multivitamin. Diabetes Mellitus and Food It is important for you to manage your blood sugar (glucose) level. Your blood glucose level can be greatly affected by what you eat. Eating healthier foods in the appropriate amounts throughout the day at about the same time each day will help you control your blood glucose level. It can also help slow or prevent worsening of your diabetes mellitus. Healthy eating may even help you improve the level of your blood pressure and reach or maintain a healthy weight.  HOW CAN FOOD AFFECT ME? Carbohydrates Carbohydrates affect your blood glucose level more than any other type of food. Your dietitian will help you determine how many carbohydrates to eat at each meal and teach you how to count carbohydrates. Counting carbohydrates is important to keep your blood glucose at a healthy level, especially if you are using insulin or taking certain medicines for diabetes mellitus. Alcohol Alcohol can cause sudden decreases in blood glucose (hypoglycemia), especially if you use insulin or take certain medicines for diabetes mellitus. Hypoglycemia can be a life-threatening condition. Symptoms of hypoglycemia (sleepiness, dizziness, and disorientation) are similar to symptoms of having too much alcohol.  If your health care provider has given you approval to drink alcohol, do so in moderation and use the following guidelines:  Women should not have more than one drink per day, and men should not have more than two drinks per day. One drink is equal to:  12 oz of beer.  5 oz of wine.  1 oz of hard liquor.  Do not drink on an empty stomach.  Keep yourself hydrated. Have water, diet soda, or unsweetened iced tea.  Regular soda, juice,  and other mixers might contain a lot of carbohydrates and should be counted. WHAT FOODS ARE NOT RECOMMENDED? As you make food choices, it is important to remember that all foods are not the same. Some foods have fewer nutrients per serving than other foods, even though they might have the same number of calories or carbohydrates. It is difficult to get your body what it needs when you eat foods with fewer nutrients. Examples of foods that you should avoid that are high in calories and carbohydrates but low in nutrients include:  Trans fats (most processed foods list trans fats on the Nutrition Facts label).  Regular soda.  Juice.  Candy.  Sweets, such as cake, pie, doughnuts, and cookies.  Fried foods. WHAT FOODS CAN I EAT? Have nutrient-rich foods, which will nourish your body and keep you healthy. The food you should eat also will depend on several factors, including:  The calories you need.  The medicines you take.  Your weight.  Your blood glucose level.  Your blood pressure level.  Your cholesterol level. You also should eat a variety of foods, including:  Protein, such as meat, poultry, fish, tofu, nuts, and seeds (lean animal proteins are best).  Fruits.  Vegetables.  Dairy products, such as milk, cheese, and yogurt (low fat is best).  Breads, grains, pasta, cereal, rice, and beans.  Fats such as olive oil, trans fat-free margarine, canola oil, avocado, and olives. DOES EVERYONE WITH DIABETES MELLITUS HAVE THE SAME MEAL PLAN? Because every person with diabetes mellitus is different, there is not  one meal plan that works for everyone. It is very important that you meet with a dietitian who will help you create a meal plan that is just right for you. Document Released: 06/11/2005 Document Revised: 09/19/2013 Document Reviewed: 08/11/2013 [facility] Patient Information 2015 [place], [place]. This information is not intended to replace advice given to you by your health  care provider. Make sure you discuss any questions you have with your health care provider.    Complementary and Alternative Medical Therapies for Diabetes Complementary and alternative medicines are health care practices or products that are not always accepted as part of routine medicine. Complementary medicine is used along with routine medicine (medical therapy). Alternative medicine can sometimes be used instead of routine medicine. Some people use these methods to treat diabetes. While some of these therapies may be effective, others may not be. Some may even be harmful. Patients using these methods need to tell their caregiver. It is important to let your caregivers know what you are doing. Some of these therapies are discussed below. For more information, talk with your caregiver. THERAPIES Acupuncture Acupuncture is done by a professional who inserts needles into certain points on the skin. Some scientists believe that this triggers the release of the body's natural painkillers. It has been shown to relieve long-term (chronic) pain. This may help patients with painful nerve damage caused by diabetes. Biofeedback Biofeedback helps a person become more aware of the body's response to pain. It also helps you learn to deal with the pain. This alternative therapy focuses on relaxation and stress-reduction techniques. Thinking of peaceful mental images (guided imagery) is one technique. Some people believe these images can ease their condition. MEDICATIONS Chromium Several studies report that chromium supplements may improve diabetes control. Chromium helps insulin improve its action. Research is not yet certain. Supplements have not been recommended or approved. Caution is needed if you have kidney (renal) problems. Ginseng There are several types of ginseng plants. American ginseng is used for diabetes studies. Those studies have shown some glucose-lowering effects. Those effects have been seen  with fasting and after-meal blood glucose levels. They have also been seen in A1c levels (average blood glucose levels over a 61-month period). More long-term studies are needed before recommendations for use of ginseng can be made. Magnesium Experts have studied the relationship between magnesium and diabetes for many years. But it is not yet fully understood. Studies suggest that a low amount of magnesium may make blood glucose control worse in type 2 diabetes. Research also shows that a low amount may contribute to certain diabetes complications. One study showed that people who consume more magnesium had less risk of type 2 diabetes. Eating whole grains, nuts, and green leafy vegetables raises the magnesium level. Vanadium Vanadium is a compound found in tiny amounts in plants and animals. Early studies showed that vanadium improved blood glucose levels in animals with type 1 and type 2 diabetes. One study found that when given vanadium, those with diabetes were able to decrease their insulin dosage. Researchers still need to learn how it works in the body to discover any side effects, and to find safe dosages. Cinnamon There have been a couple of studies that seem to indicate cinnamon decreases insulin resistance and increases insulin production. By doing so, it may lower blood glucose. Exact doses are unknown, but it may work best when used in combination with other diabetes medicines. Document Released: 07/12/2007 Document Revised: 12/07/2011 Document Reviewed: 07/25/2009 [facility] Patient Information 2015 [place], [place].  This information is not intended to replace advice given to you by your health care provider. Make sure you discuss any questions you have with your health care provider.  Diabetes, Eating Away From Home Sometimes, you might eat in a restaurant or have meals that are prepared by someone else. You can enjoy eating out. However, the portions in restaurants may be much larger than  needed. Listed below are some ideas to help you choose foods that will keep your blood glucose (sugar) in better control.  TIPS FOR EATING OUT  Know your meal plan and how many carbohydrate servings you should have at each meal. You may wish to carry a copy of your meal plan in your purse or wallet. Learn the foods included in each food group.  Make a list of restaurants near you that offer healthy choices. Take a copy of the carry-out menus to see what they offer. Then, you can plan what you will order ahead of time.  Become familiar with serving sizes by practicing them at home using measuring cups and spoons. Once you learn to recognize portion sizes, you will be able to correctly estimate the amount of total carbohydrate you are allowed to eat at the restaurant. Ask for a takeout box if the portion is more than you should have. When your food comes, leave the amount you should have on the plate, and put the rest in the takeout box before you start eating.  Plan ahead if your mealtime will be different from usual. Check with your caregiver to find out how to time meals and medicine if you are taking insulin.  Avoid high-fat foods, such as fried foods, cream sauces, high-fat salad dressings, or any added butter or margarine.  Do not be afraid to ask questions. Ask your server about the portion size, cooking methods, ingredients and if items can be substituted. Restaurants do not list all available items on the menu. You can ask for your main entree to be prepared using skim milk, oil instead of butter or margarine, and without gravy or sauces. Ask your waiter or waitress to serve salad dressings, gravy, sauces, margarine, and sour cream on the side. You can then add the amount your meal plan suggests.  Add more vegetables whenever possible.  Avoid items that are labeled "jumbo," "giant," "deluxe," or "supersized."  You may want to split an entre with someone and order an extra side  salad.  Watch for hidden calories in foods like croutons, bacon, or cheese.  Ask your server to take away the bread basket or chips from your table.  Order a dinner salad as an appetizer. You can eat most foods served in a restaurant. Some foods are better choices than others. Breads and Starches  Recommended: All kinds of bread (wheat, rye, white, oatmeal, [place], [place], raisin), hard or soft dinner rolls, frankfurter or hamburger buns, small bagels, small corn or whole-wheat flour tortillas.  Avoid: Frosted or glazed breads, butter rolls, egg or cheese breads, croissants, sweet rolls, pastries, coffee cake, glazed or frosted doughnuts, muffins. Crackers  Recommended: Animal crackers, graham, rye, saltine, oyster, and matzoth crackers. Bread sticks, melba toast, rusks, pretzels, popcorn (without fat), zwieback toast.  Avoid: High-fat snack crackers or chips. Buttered popcorn. Cereals  Recommended: Hot and cold cereals. Whole grains such as oatmeal or shredded wheat are good choices.  Avoid: Sugar-coated or granola type cereals. Potatoes/Pasta/Rice/Beans  Recommended: Order baked, boiled, or mashed potatoes, rice or noodles without added fat, whole beans. Order  gravies, butter, margarine, or sauces on the side so you can control the amount you add.  Avoid: Hash browns or fried potatoes. Potatoes, pasta, or rice prepared with cream or cheese sauce. Potato or pasta salads prepared with large amounts of dressing. Fried beans or fried rice. Vegetables  Recommended: Order steamed, baked, boiled, or stewed vegetables without sauces or extra fat. Ask that sauce be served on the side. If vegetables are not listed on the menu, ask what is available.  Avoid: Vegetables prepared with cream, butter, or cheese sauce. Fried vegetables. Salad Bars  Recommended: Many of the vegetables at a salad bar are considered "free." Use lemon juice, vinegar, or low-calorie salad dressing (fewer than 20  calories per serving) as "free" dressings for your salad. Look for salad bar ingredients that have no added fat or sugar such as tomatoes, lettuce, cucumbers, broccoli, carrots, onions, and mushrooms.  Avoid: Prepared salads with large amounts of dressing, such as coleslaw, caesar salad, macaroni salad, bean salad, or carrot salad. Fruit  Recommended: Eat fresh fruit or fresh fruit salad without added dressing. A salad bar often offers fresh fruit choices, but canned fruit at a restaurant is usually packed in sugar or syrup.  Avoid: Sweetened canned or frozen fruits, plain or sweetened fruit juice. Fruit salads with dressing, sour cream, or sugar added to them. Meat and Meat Substitutes  Recommended: Order broiled, baked, roasted, or grilled meat, poultry, or fish. Trim off all visible fat. Do not eat the skin of poultry. The size stated on the menu is the raw weight. Meat shrinks by  in cooking (for example, 4 oz raw equals 3 oz cooked meat).  Avoid: Deep-fat fried meat, poultry, or fish. Breaded meats. Eggs  Recommended: Order soft, hard-cooked, poached, or scrambled eggs. Omelets may be okay, depending on what ingredients are added. Egg substitutes are also a good choice.  Avoid: Fried eggs, eggs prepared with cream or cheese sauce. Milk  Recommended: Order low-fat or fat-free milk according to your meal plan. Plain, nonfat yogurt or flavored yogurt with no sugar added may be used as a substitute for milk. Soy milk may also be used.  Avoid: Milk shakes or sweetened milk beverages. Soups and Combination Foods  Recommended: Clear broth or consomm are "free" foods and may be used as an appetizer. Broth-based soups with fat removed count as a starch serving and are preferred over cream soups. Soups made with beans or split peas may be eaten but count as a starch.  Avoid: Fatty soups, soup made with cream, cheese soup. Combination foods prepared with excessive amounts of fat or with cream  or cheese sauces. Desserts and Sweets  Recommended: Ask for fresh fruit. Sponge or angel food cake without icing, ice milk, no sugar added ice cream, sherbet, or frozen yogurt may fit into your meal plan occasionally.  Avoid: Pastries, puddings, pies, cakes with icing, custard, gelatin desserts. Fats and Oils  Recommended: Choose healthy fats such as olive oil, canola oil, or tub margarine, reduced fat or fat-free sour cream, cream cheese, avocado, or nuts.  Avoid: Any fats in excess of your allowed portion. Deep-fried foods or any food with a large amount of fat. Note: Ask for all fats to be served on the side, and limit your portion sizes according to your meal plan. Document Released: 09/14/2005 Document Revised: 12/07/2011 Document Reviewed: 12/12/2013 [facility] Patient Information 2015 [place], [place]. This information is not intended to replace advice given to you by your health care provider.  Make sure you discuss any questions you have with your health care provider.  

## 2015-04-19 NOTE — Progress Notes (Deleted)
Last CPE done by myself on 06/23/2014 Pre-DM last checked 05/2014 hgba1c was 6.1%  TDAP- 2012 Shingles vaccine - 2013.  Colonoscopy- 2009 by Dr. Loreta Ave. Result are not present in epic charts. Pt reports last colonoscopy was June 2014. Pt has a distant hx of abnormal abnormal papsmear but was normal when last checked. Pap smear was done by myself at 12/2012 and was normal with neg high risk HPV so does not need to be repeated until 2019.  Had neg hep C 12/2012  Mammogram several months prior and was negative

## 2015-04-20 LAB — HIV ANTIBODY (ROUTINE TESTING W REFLEX): HIV: NONREACTIVE

## 2015-06-20 ENCOUNTER — Encounter: Payer: Self-pay | Admitting: Family Medicine

## 2015-06-21 ENCOUNTER — Encounter: Payer: Self-pay | Admitting: Family Medicine

## 2015-06-21 DIAGNOSIS — M75101 Unspecified rotator cuff tear or rupture of right shoulder, not specified as traumatic: Secondary | ICD-10-CM

## 2015-06-21 NOTE — Assessment & Plan Note (Signed)
02/07/15 [facility] Dr. Teryl Lucy Status post arthoscopic rotator cuff repair and postoperative regional pain syndrome Plan:  Continue to work with physical therapy:  Continue to be very aggressive with her range of motion and strengthening.

## 2015-07-19 ENCOUNTER — Telehealth: Payer: Self-pay

## 2015-07-19 NOTE — Telephone Encounter (Signed)
Dr Clelia CroftShaw do you recall?

## 2015-07-19 NOTE — Telephone Encounter (Signed)
Spoke with patient today regarding records request. While speaking with her and looking in her chart, she stated that she has an upcoming appt in November with Dr. Clelia CroftShaw for a CPE. It looks like her visit from April 19, 2015 was coded as a CPE.  Patient says there was some confusion about her last visit and it ended up being just for a med refill but the chart says annual exam. Can someone verify this? Cb# [phone].

## 2015-07-19 NOTE — Telephone Encounter (Signed)
Yes pt is correct - her physical last yr was in Sept and then when she came in in July for it we were concerned her ins wouldn't cover so just billed a 99214 level at that visit. I will addend that note to take off the annual physical diagnosis so that it doesn't appear so confusing. So pt is eligible to get her physical anytime she would like now. Marland Kitchen. .Marland Kitchen

## 2015-08-08 ENCOUNTER — Ambulatory Visit (INDEPENDENT_AMBULATORY_CARE_PROVIDER_SITE_OTHER): Payer: BC Managed Care – PPO | Admitting: Family Medicine

## 2015-08-08 ENCOUNTER — Encounter: Payer: Self-pay | Admitting: Family Medicine

## 2015-08-08 ENCOUNTER — Other Ambulatory Visit: Payer: Self-pay | Admitting: Family Medicine

## 2015-08-08 VITALS — BP 123/81 | HR 79 | Temp 98.3°F | Resp 16 | Ht 65.0 in | Wt 241.0 lb

## 2015-08-08 DIAGNOSIS — Z1383 Encounter for screening for respiratory disorder NEC: Secondary | ICD-10-CM

## 2015-08-08 DIAGNOSIS — Z5181 Encounter for therapeutic drug level monitoring: Secondary | ICD-10-CM | POA: Diagnosis not present

## 2015-08-08 DIAGNOSIS — E669 Obesity, unspecified: Secondary | ICD-10-CM | POA: Diagnosis not present

## 2015-08-08 DIAGNOSIS — Z0000 Encounter for general adult medical examination without abnormal findings: Secondary | ICD-10-CM | POA: Diagnosis not present

## 2015-08-08 DIAGNOSIS — Z1389 Encounter for screening for other disorder: Secondary | ICD-10-CM | POA: Diagnosis not present

## 2015-08-08 DIAGNOSIS — Z136 Encounter for screening for cardiovascular disorders: Secondary | ICD-10-CM | POA: Diagnosis not present

## 2015-08-08 DIAGNOSIS — Z23 Encounter for immunization: Secondary | ICD-10-CM | POA: Diagnosis not present

## 2015-08-08 DIAGNOSIS — Z1329 Encounter for screening for other suspected endocrine disorder: Secondary | ICD-10-CM

## 2015-08-08 DIAGNOSIS — I10 Essential (primary) hypertension: Secondary | ICD-10-CM

## 2015-08-08 DIAGNOSIS — Z113 Encounter for screening for infections with a predominantly sexual mode of transmission: Secondary | ICD-10-CM | POA: Diagnosis not present

## 2015-08-08 DIAGNOSIS — E119 Type 2 diabetes mellitus without complications: Secondary | ICD-10-CM

## 2015-08-08 DIAGNOSIS — Z1239 Encounter for other screening for malignant neoplasm of breast: Secondary | ICD-10-CM

## 2015-08-08 LAB — POCT URINALYSIS DIP (MANUAL ENTRY)
BILIRUBIN UA: NEGATIVE
Bilirubin, UA: NEGATIVE
Blood, UA: NEGATIVE
Glucose, UA: NEGATIVE
LEUKOCYTES UA: NEGATIVE
Nitrite, UA: NEGATIVE
Protein Ur, POC: NEGATIVE
Spec Grav, UA: 1.02
Urobilinogen, UA: 0.2
pH, UA: 5.5

## 2015-08-08 LAB — POCT GLYCOSYLATED HEMOGLOBIN (HGB A1C): HEMOGLOBIN A1C: 6.3

## 2015-08-08 LAB — HEMOGLOBIN A1C: HEMOGLOBIN A1C: 6.3 % — AB (ref 4.0–6.0)

## 2015-08-08 MED ORDER — PHENTERMINE HCL 15 MG PO CAPS: ORAL_CAPSULE | ORAL | Status: DC

## 2015-08-08 MED ORDER — METFORMIN HCL 500 MG PO TABS: 500.0000 mg | ORAL_TABLET | Freq: Two times a day (BID) | ORAL | Status: DC

## 2015-08-08 NOTE — Progress Notes (Signed)
Subjective:    Patient ID: Katherine [name], female    DOB: May 20, 1956, 59 y.o.   MRN: [id number] Chief Complaint  Patient presents with  . Annual Exam    HPI Ms. Katherine [name] is a delightful 59 yo female here today for her complete physical.   She has been working very hard on diet/exercise since she was newly diagnosed with DM with a a1c of 6.7 at our last visit.  She has started on metformin 500 qam and has been taking a dose with a high carb meal.  She has been drinking fruit smoothies w/ kale and spinach from breakfast.  Sometimes skip lunch or will eat the rest of her smoothie.  For dinner, she will bake/broil chicken, grill fish with lots of veggies, brown rice.- tries to eat by 7 pm - sleeps from midnight to mid-morning Had spinach pineapple chicken pizza last night.  She has been walking for exercise and gets on the ellipitical several times each week.  Drinks water and tang.  Only juice when sick. No side effects from metformin.  Disappointed that she hasn't seen any weight loss over the past sev mos when she has been trying so hard and being really compliant with diet/exericse. Has never been on any weight los med or thought about it but would be open to anything that would help. Sleeping well, good mood  Has bp cuff at home.  Last CPE done by myself on 06/23/2014  TDAP- 2012  Shingles vaccine - 2013.  Colonoscopy- 2009 and repeated June 2014 by Dr. Loreta AveMann. Result are not present in epic charts.  Pt has a distant hx of abnormal abnormal papsmear but was normal when last done by myself at 12/2012 and was normal with neg high risk HPV so does not need to be repeated until 2019.  Had neg hep C 12/2012 Mammogram several months prior and was negative   Past Medical History  Diagnosis Date  . Hypertension    Past Surgical History  Procedure Laterality Date  . Cholecystectomy    . Rotator cuff surgery     Current Outpatient Prescriptions on File Prior to Visit  Medication Sig  Dispense Refill  . amLODipine (NORVASC) 5 MG tablet Take 1 tablet (5 mg total) by mouth daily. 90 tablet 3  . meloxicam (MOBIC) 15 MG tablet TAKE 1 TABLET BY MOUTH ONCE DAILY as needed for joint pain 30 tablet 5  . ranitidine (ZANTAC) 150 MG tablet TAKE ONE TABLET BY MOUTH TWICE DAILY AS NEEDED FOR  HEARTBURN 60 tablet 11  . vitamin B-12 (CYANOCOBALAMIN) 500 MCG tablet Take 500 mcg by mouth daily.     No current facility-administered medications on file prior to visit.   Allergies  Allergen Reactions  . Ace Inhibitors Cough  . Adhesive [Tape] Rash   Family History  Problem Relation Age of Onset  . Diabetes Father   . Hypertension Father   . Sickle cell anemia Brother   . Cancer Maternal Grandmother     breast cancer  . Cancer Brother 15    Leukemia, passed away at [id number]   Social History   Social History  . Marital Status: Married    Spouse Name: N/A  . Number of Children: N/A  . Years of Education: Masters    Occupational History  . Teacher    Social History Main Topics  . Smoking status: Never Smoker   . Smokeless tobacco: Never Used  . Alcohol Use: 0.0 oz/week  0 Standard drinks or equivalent per week     Comment: social  . Drug Use: No  . Sexual Activity: Yes   Other Topics Concern  . None   Social History Narrative   Patient has had 2 pregnancies and has 2 living children.    Review of Systems  Constitutional: Positive for activity change. Negative for appetite change.  All other systems reviewed and are negative.      Objective:  BP 123/81 mmHg  Pulse 79  Temp(Src) 98.3 F (36.8 C)  Resp 16  Ht  (1.651 m)  Wt 241 lb (109.317 kg)  BMI 40.10 kg/m2  Physical Exam  Constitutional: She is oriented to person, place, and time. She appears well-developed and well-nourished. No distress.  HENT:  Head: Normocephalic and atraumatic.  Right Ear: Tympanic membrane, external ear and ear canal normal.  Left Ear: Tympanic membrane, external ear and ear  canal normal.  Nose: Nose normal. No mucosal edema or rhinorrhea.  Mouth/Throat: Uvula is midline, oropharynx is clear and moist and mucous membranes are normal. No posterior oropharyngeal erythema.  Eyes: Conjunctivae and EOM are normal. Pupils are equal, round, and reactive to light. Right eye exhibits no discharge. Left eye exhibits no discharge. No scleral icterus.  Neck: Normal range of motion. Neck supple. No thyromegaly present.  Cardiovascular: Normal rate, regular rhythm, normal heart sounds and intact distal pulses.   Pulmonary/Chest: Effort normal and breath sounds normal. No respiratory distress.  Abdominal: Soft. Bowel sounds are normal. There is no tenderness.  Musculoskeletal: She exhibits no edema.  Lymphadenopathy:    She has no cervical adenopathy.  Neurological: She is alert and oriented to person, place, and time. She has normal reflexes.  Skin: Skin is warm and dry. She is not diaphoretic. No erythema.  Psychiatric: She has a normal mood and affect. Her behavior is normal.      Results for orders placed or performed in visit on 08/08/15  Microalbumin, urine  Result Value Ref Range   Microalb, Ur 1.3 Not estab mg/dL  POCT glycosylated hemoglobin (Hb A1C)  Result Value Ref Range   Hemoglobin A1C 6.3   POCT urinalysis dipstick  Result Value Ref Range   Color, UA other (A) yellow   Clarity, UA clear clear   Glucose, UA negative negative   Bilirubin, UA negative negative   Ketones, POC UA negative negative   Spec Grav, UA 1.020    Blood, UA negative negative   pH, UA 5.5    Protein Ur, POC negative negative   Urobilinogen, UA 0.2    Nitrite, UA Negative Negative   Leukocytes, UA Negative Negative   [facility] reading (PRIMARY) by  Dr. Clelia Croft. EKG: NSR, no ischemic change Assessment & Plan:   1. Annual physical exam - due for pap 2019, sees Dr. Loreta Ave for colonoscopy done 2014, mammograms and vaccine UTD. Pneumovax done today since pt with type 2 DM, flu shot last mo    2. Routine screening for STI (sexually transmitted infection)   3. Screening for breast cancer   4. Type 2 diabetes mellitus without complication, without long-term current use of insulin (HCC) - new diagnosis - pt VERY motivated for change and has reduced a1c from 6.7 3 mos prior to 6.3 now on metformin 500 qam - increase to bid and cont to work on tlc and weightloss with the hope of coming off.  5. Screening for thyroid disorder   6. Screening for cardiovascular, respiratory, and genitourinary diseases  7. Essential hypertension   8. Obesity (BMI 35.0-39.9 without comorbidity) (HCC) - will start trial of phenteramine - try for 4-8 lbs of weight loss/mo - recheck in 1 mo, check bp at home.  9. Medication monitoring encounter     Orders Placed This Encounter  Procedures  . Pneumococcal polysaccharide vaccine 23-valent greater than or equal to 2yo subcutaneous/IM  . Lipid panel    Order Specific Question:  Has the patient fasted?    Answer:  Yes  . Comprehensive metabolic panel    Order Specific Question:  Has the patient fasted?    Answer:  Yes  . TSH  . Microalbumin, urine  . POCT glycosylated hemoglobin (Hb A1C)  . POCT urinalysis dipstick  . EKG 12-Lead    Meds ordered this encounter  Medications  . DISCONTD: metFORMIN (GLUCOPHAGE) 500 MG tablet    Sig: Take by mouth 2 (two) times daily with a meal.  . phentermine 15 MG capsule    Sig: Take 1 tab every morning and 1 tab before lunch as instructed by your physician    Dispense:  60 capsule    Refill:  0  . metFORMIN (GLUCOPHAGE) 500 MG tablet    Sig: Take 1 tablet (500 mg total) by mouth 2 (two) times daily with a meal.    Dispense:  180 tablet    Refill:  1    [name], MD MPH

## 2015-08-08 NOTE — Patient Instructions (Signed)
I would like to see you loose 2 pounds a week - 8 pounds a month.  Start just 1 tab before breakfast and then if you are doing ok in 1-2 weeks you can start 1 tab before lunch.  Remember - try to increase your lunch volume and decrease your dinner volume.  Leave on the metformin for now. Check your blood pressure twice a week.  Exercising to Lose Weight Exercising can help you to lose weight. In order to lose weight through exercise, you need to do vigorous-intensity exercise. You can tell that you are exercising with vigorous intensity if you are breathing very hard and fast and cannot hold a conversation while exercising. Moderate-intensity exercise helps to maintain your current weight. You can tell that you are exercising at a moderate level if you have a higher heart rate and faster breathing, but you are still able to hold a conversation. HOW OFTEN SHOULD I EXERCISE? Choose an activity that you enjoy and set realistic goals. Your health care provider can help you to make an activity plan that works for you. Exercise regularly as directed by your health care provider. This may include:  Doing resistance training twice each week, such as:  Push-ups.  Sit-ups.  Lifting weights.  Using resistance bands.  Doing a given intensity of exercise for a given amount of time. Choose from these options:  150 minutes of moderate-intensity exercise every week.  75 minutes of vigorous-intensity exercise every week.  A mix of moderate-intensity and vigorous-intensity exercise every week. Children, pregnant women, people who are out of shape, people who are overweight, and older adults may need to consult a health care provider for individual recommendations. If you have any sort of medical condition, be sure to consult your health care provider before starting a new exercise program. WHAT ARE SOME ACTIVITIES THAT CAN HELP ME TO LOSE WEIGHT?   Walking at a rate of at least 4.5 miles an  hour.  Jogging or running at a rate of 5 miles per hour.  Biking at a rate of at least 10 miles per hour.  Lap swimming.  Roller-skating or in-line skating.  Cross-country skiing.  Vigorous competitive sports, such as football, basketball, and soccer.  Jumping rope.  Aerobic dancing. HOW CAN I BE MORE ACTIVE IN MY DAY-TO-DAY ACTIVITIES?  Use the stairs instead of the elevator.  Take a walk during your lunch break.  If you drive, park your car farther away from work or school.  If you take public transportation, get off one stop early and walk the rest of the way.  Make all of your phone calls while standing up and walking around.  Get up, stretch, and walk around every 30 minutes throughout the day. WHAT GUIDELINES SHOULD I FOLLOW WHILE EXERCISING?  Do not exercise so much that you hurt yourself, feel dizzy, or get very short of breath.  Consult your health care provider prior to starting a new exercise program.  Wear comfortable clothes and shoes with good support.  Drink plenty of water while you exercise to prevent dehydration or heat stroke. Body water is lost during exercise and must be replaced.  Work out until you breathe faster and your heart beats faster.   This information is not intended to replace advice given to you by your health care provider. Make sure you discuss any questions you have with your health care provider.   Document Released: 10/17/2010 Document Revised: 10/05/2014 Document Reviewed: 02/15/2014 Elsevier Interactive Patient Education 2016  [employer].

## 2015-08-09 LAB — COMPREHENSIVE METABOLIC PANEL
ALK PHOS: 111 U/L (ref 33–130)
ALT: 19 U/L (ref 6–29)
AST: 17 U/L (ref 10–35)
Albumin: 4.1 g/dL (ref 3.6–5.1)
BUN: 12 mg/dL (ref 7–25)
CO2: 27 mmol/L (ref 20–31)
Calcium: 9.7 mg/dL (ref 8.6–10.4)
Chloride: 105 mmol/L (ref 98–110)
Creat: 0.85 mg/dL (ref 0.50–0.99)
GLUCOSE: 103 mg/dL — AB (ref 65–99)
POTASSIUM: 4.5 mmol/L (ref 3.5–5.3)
Sodium: 142 mmol/L (ref 135–146)
Total Bilirubin: 0.6 mg/dL (ref 0.2–1.2)
Total Protein: 7.7 g/dL (ref 6.1–8.1)

## 2015-08-09 LAB — LIPID PANEL
Cholesterol: 171 mg/dL (ref 125–200)
HDL: 39 mg/dL — ABNORMAL LOW (ref >=46)
LDL CALC: 102 mg/dL (ref <130)
TRIGLYCERIDES: 149 mg/dL (ref <150)
Total CHOL/HDL Ratio: 4.4 Ratio (ref <=5.0)
VLDL: 30 mg/dL (ref <30)

## 2015-08-09 LAB — MICROALBUMIN, URINE: Microalb, Ur: 1.3 mg/dL

## 2015-08-09 LAB — TSH: TSH: 1.292 u[IU]/mL (ref 0.350–4.500)

## 2015-08-29 ENCOUNTER — Encounter: Payer: Self-pay | Admitting: Family Medicine

## 2015-09-06 ENCOUNTER — Other Ambulatory Visit: Payer: Self-pay | Admitting: Family Medicine

## 2015-09-10 ENCOUNTER — Telehealth: Payer: Self-pay | Admitting: Family Medicine

## 2015-09-10 MED ORDER — PHENTERMINE HCL 15 MG PO CAPS: ORAL_CAPSULE | ORAL | Status: DC

## 2015-09-10 NOTE — Telephone Encounter (Signed)
Pt states the pharmacy have called regarding a refill on her PHENTERMINE 15 MG and they haven't heard from anyone. States she had just seen Dr Clelia CroftShaw and was to get a 3 month's supply Please call [phone]      [facility] [address]

## 2015-09-10 NOTE — Telephone Encounter (Signed)
Patient coming in to p/u rx for phentermine 15mg .  Dr. Clelia CroftShaw wrote on 12/13/207 but did not sign. Per Deliah BostonMichael Clark PA-C reorder medication under him and he will sign

## 2015-09-10 NOTE — Telephone Encounter (Signed)
Dr Clelia CroftShaw wanted to see pt back in a month (from 11/10), but pt does have appt on 12/29. Will RF to cover her until then. Notified pt.

## 2015-09-11 NOTE — Telephone Encounter (Signed)
Signed for Dr. Clelia CroftShaw as she was out of clinic. Deliah BostonMichael Kelsea Mousel, MS, PA-C 2:43 PM, 09/11/2015

## 2015-09-26 ENCOUNTER — Ambulatory Visit (INDEPENDENT_AMBULATORY_CARE_PROVIDER_SITE_OTHER): Payer: BC Managed Care – PPO | Admitting: Family Medicine

## 2015-09-26 ENCOUNTER — Encounter: Payer: Self-pay | Admitting: Family Medicine

## 2015-09-26 VITALS — BP 128/85 | HR 99 | Temp 97.3°F | Resp 16 | Ht 65.0 in | Wt 231.0 lb

## 2015-09-26 DIAGNOSIS — J069 Acute upper respiratory infection, unspecified: Secondary | ICD-10-CM | POA: Diagnosis not present

## 2015-09-26 DIAGNOSIS — I10 Essential (primary) hypertension: Secondary | ICD-10-CM | POA: Diagnosis not present

## 2015-09-26 DIAGNOSIS — Z5181 Encounter for therapeutic drug level monitoring: Secondary | ICD-10-CM

## 2015-09-26 MED ORDER — METFORMIN HCL 500 MG PO TABS
500.0000 mg | ORAL_TABLET | Freq: Two times a day (BID) | ORAL | Status: DC
Start: 2015-09-26 — End: 2016-03-04

## 2015-09-26 MED ORDER — PHENTERMINE HCL 15 MG PO CAPS: ORAL_CAPSULE | ORAL | Status: DC

## 2015-09-26 NOTE — Patient Instructions (Addendum)

## 2015-09-26 NOTE — Progress Notes (Signed)
Subjective:    Patient ID: Katherine [name], female    DOB: 10-09-1955, 59 y.o.   MRN: [id number] Chief Complaint  Patient presents with  . [name]  . Hypertension  . Medication Management    HPI Was seen for a CPE 6 wks ago.  She had been working VERY hard on weightloss without effect despite great exercise, diet, metformin so we decided to start her on phenteramine bid and recheck today.  incrase metofrmin to bid more reg instead off occ skipping evening dose. a1c had decreased from 6.7->6.3   Is going to start back exercising.  BP was 147 systolic at the highest.  Got a URI yesterday and was coughing a lot last night.  Not using any  Otc, eating citrus.  Arm is good  Past Medical History  Diagnosis Date  . Hypertension    Past Surgical History  Procedure Laterality Date  . Cholecystectomy    . Rotator cuff surgery     Current Outpatient Prescriptions on File Prior to Visit  Medication Sig Dispense Refill  . amLODipine (NORVASC) 5 MG tablet TAKE ONE TABLET BY MOUTH ONCE DAILY 90 tablet 0  . meloxicam (MOBIC) 15 MG tablet TAKE 1 TABLET BY MOUTH ONCE DAILY as needed for joint pain 30 tablet 5  . ranitidine (ZANTAC) 150 MG tablet TAKE ONE TABLET BY MOUTH TWICE DAILY AS NEEDED FOR  HEARTBURN 60 tablet 11  . vitamin B-12 (CYANOCOBALAMIN) 500 MCG tablet Take 500 mcg by mouth daily.     No current facility-administered medications on file prior to visit.   Allergies  Allergen Reactions  . Ace Inhibitors Cough  . Adhesive [Tape] Rash   Family History  Problem Relation Age of Onset  . Diabetes Father   . Hypertension Father   . Sickle cell anemia Brother   . Cancer Maternal Grandmother     breast cancer  . Cancer Brother 15    Leukemia, passed away at 64   Social History   Social History  . Marital Status: Married    Spouse Name: N/A  . Number of Children: N/A  . Years of Education: Masters    Occupational History  . Teacher    Social History  Main Topics  . Smoking status: Never Smoker   . Smokeless tobacco: Never Used  . Alcohol Use: 0.0 oz/week    0 Standard drinks or equivalent per week     Comment: social  . Drug Use: No  . Sexual Activity: Yes   Other Topics Concern  . None   Social History Narrative   Patient has had 2 pregnancies and has 2 living children.     Review of Systems  Constitutional: Positive for activity change. Negative for fever, chills, diaphoresis, appetite change, fatigue and unexpected weight change.  Respiratory: Negative for cough, chest tightness and shortness of breath.   Cardiovascular: Negative for chest pain, palpitations and leg swelling.  Gastrointestinal: Negative for nausea, vomiting, abdominal pain, diarrhea and constipation.  Musculoskeletal: Negative for arthralgias and gait problem.  Neurological: Negative for dizziness, syncope, weakness, light-headedness, numbness and headaches.  Psychiatric/Behavioral: Negative for sleep disturbance and dysphoric mood. The patient is not nervous/anxious.        Objective:  BP 128/85 mmHg  Pulse 99  Temp(Src) 97.3 F (36.3 C)  Resp 16  Ht  (1.651 m)  Wt 231 lb (104.781 kg)  BMI 38.44 kg/m2  Physical Exam  Constitutional: She is oriented to person, place, and time.  She appears well-developed and well-nourished. No distress.  HENT:  Head: Normocephalic and atraumatic.  Right Ear: External ear normal.  Left Ear: External ear normal.  Eyes: Conjunctivae are normal. No scleral icterus.  Neck: Normal range of motion. Neck supple. No thyromegaly present.  Cardiovascular: Normal rate, regular rhythm, normal heart sounds and intact distal pulses.   Pulmonary/Chest: Effort normal and breath sounds normal. No respiratory distress.  Musculoskeletal: She exhibits no edema.  Lymphadenopathy:    She has no cervical adenopathy.  Neurological: She is alert and oriented to person, place, and time.  Skin: Skin is warm and dry. She is not  diaphoretic. No erythema.  Psychiatric: She has a normal mood and affect. Her behavior is normal.      [facility] reading (PRIMARY) by  Dr. Clelia CroftShaw. EKG: NSR, no acute ischemic change    Assessment & Plan:   1. Medication monitoring encounter   2. Essential hypertension   3. Acute URI   4.      Obesity - pt doing AMAZING with 10 lb weightloss in the past 6 wks.  Working very hard on diet/exercise. Tol phentermine well w/o side effects, vital nml and did take today so ok to continue.  Goal weight around 170 (aim for BMI at the low end of overweight range). Advised that at some point her weight loss will plateau at which point we will want to stop the phenteramine. Pt understands and agrees. 5.    New onset type 2 DM - cont on metformin while working on tlc - not having any metformin side effects.  Orders Placed This Encounter  Procedures  . EKG 12-Lead    Meds ordered this encounter  Medications  . Multiple Vitamin (MULTIVITAMIN) tablet    Sig: Take 1 tablet by mouth daily.  . phentermine 15 MG capsule    Sig: TAKE 1 TABLET BY MOUTH EVERY MORNING AND 1 TABLET BEFORE LUNCH AS INSTRUCTED BY YOUR PHYSICIAN    Dispense:  60 capsule    Refill:  3  . metFORMIN (GLUCOPHAGE) 500 MG tablet    Sig: Take 1 tablet (500 mg total) by mouth 2 (two) times daily with a meal.    Dispense:  180 tablet    Refill:  1    Norberto SorensonEva Shaw, MD MPH

## 2015-10-15 ENCOUNTER — Telehealth: Payer: Self-pay

## 2015-10-15 NOTE — Telephone Encounter (Signed)
PA completed on covermymeds for phentermine. Pending. 

## 2015-10-16 NOTE — Telephone Encounter (Signed)
PA approved through 01/13/2016. Notified pharm.

## 2015-10-31 ENCOUNTER — Other Ambulatory Visit: Payer: Self-pay | Admitting: Family Medicine

## 2016-01-07 ENCOUNTER — Other Ambulatory Visit: Payer: Self-pay | Admitting: Family Medicine

## 2016-01-08 NOTE — Telephone Encounter (Signed)
Do you want to RF? PA was approved through 4/17, so after this will need another PA done.

## 2016-01-13 NOTE — Telephone Encounter (Signed)
Faxed

## 2016-01-23 ENCOUNTER — Ambulatory Visit (INDEPENDENT_AMBULATORY_CARE_PROVIDER_SITE_OTHER): Payer: BC Managed Care – PPO | Admitting: Family Medicine

## 2016-01-23 ENCOUNTER — Encounter: Payer: Self-pay | Admitting: Family Medicine

## 2016-01-23 VITALS — BP 112/78 | HR 95 | Temp 98.0°F | Resp 16 | Ht 65.0 in | Wt 220.8 lb

## 2016-01-23 DIAGNOSIS — E669 Obesity, unspecified: Secondary | ICD-10-CM

## 2016-01-23 DIAGNOSIS — Z5181 Encounter for therapeutic drug level monitoring: Secondary | ICD-10-CM

## 2016-01-23 DIAGNOSIS — E119 Type 2 diabetes mellitus without complications: Secondary | ICD-10-CM | POA: Diagnosis not present

## 2016-01-23 DIAGNOSIS — I10 Essential (primary) hypertension: Secondary | ICD-10-CM

## 2016-01-23 LAB — COMPREHENSIVE METABOLIC PANEL
ALBUMIN: 4.2 g/dL (ref 3.6–5.1)
ALK PHOS: 97 U/L (ref 33–130)
ALT: 20 U/L (ref 6–29)
AST: 18 U/L (ref 10–35)
BILIRUBIN TOTAL: 0.5 mg/dL (ref 0.2–1.2)
BUN: 16 mg/dL (ref 7–25)
CALCIUM: 9.7 mg/dL (ref 8.6–10.4)
CO2: 27 mmol/L (ref 20–31)
Chloride: 103 mmol/L (ref 98–110)
Creat: 0.94 mg/dL (ref 0.50–0.99)
Glucose, Bld: 100 mg/dL — ABNORMAL HIGH (ref 65–99)
POTASSIUM: 4.3 mmol/L (ref 3.5–5.3)
Sodium: 137 mmol/L (ref 135–146)
TOTAL PROTEIN: 7.4 g/dL (ref 6.1–8.1)

## 2016-01-23 LAB — POCT GLYCOSYLATED HEMOGLOBIN (HGB A1C): Hemoglobin A1C: 6.3

## 2016-01-23 MED ORDER — PHENTERMINE HCL 15 MG PO CAPS: ORAL_CAPSULE | ORAL | Status: DC

## 2016-01-23 NOTE — Progress Notes (Signed)
Subjective:    Patient ID: [name], female    DOB: 1956/01/26, 60 y.o.   MRN: [id number] Chief Complaint  Patient presents with  . [name]    MEDICATION    HPI  Ms. Hendricksen is here today for a routine 4 mo follow-up of her chronic medical problems.   Obesity: Pt had failed to loose weight with lifestyle change alone despite good compliance with it and metformin failed to help so we started pt on phentermine  bid on 11/10 and had great result after 6 weeks loosing 10 lbs. Pt's goal weight 170 lbs.  She is eating breakfast with a smoothie and food. Eats her main meal around 2-4 pm and then will have a smoothie at night before dinner. She is also getting regular exercise still.  She is still taking metformin 1 tab in the morning. She then takes the phenteramine twice a day 15 min before she eats DMII: a1c 6.7 -> 6.3 on metformin 500 bid - new onset earlier this yr; nml microalb 6 mos prior; lipid panel 6 mos prior was close to goal; asa?; optho?; allergic to acei HTN: On amlodipine 5; does monitor BP at home  Past Medical History  Diagnosis Date  . Hypertension    Past Surgical History  Procedure Laterality Date  . Cholecystectomy    . Rotator cuff surgery     Current Outpatient Prescriptions on File Prior to Visit  Medication Sig Dispense Refill  . amLODipine (NORVASC) 5 MG tablet TAKE ONE TABLET BY MOUTH ONCE DAILY 90 tablet 0  . metFORMIN (GLUCOPHAGE) 500 MG tablet Take 1 tablet (500 mg total) by mouth 2 (two) times daily with a meal. 180 tablet 1  . Multiple Vitamin (MULTIVITAMIN) tablet Take 1 tablet by mouth daily.    . ranitidine (ZANTAC) 150 MG tablet TAKE ONE TABLET BY MOUTH TWICE DAILY AS NEEDED FOR  HEARTBURN 60 tablet 11  . vitamin B-12 (CYANOCOBALAMIN) 500 MCG tablet Take 500 mcg by mouth daily.    . meloxicam (MOBIC) 15 MG tablet TAKE 1 TABLET BY MOUTH ONCE DAILY as needed for joint pain (Patient not taking: Reported on 01/23/2016) 30 tablet 5   No  current facility-administered medications on file prior to visit.   Allergies  Allergen Reactions  . Ace Inhibitors Cough  . Adhesive [Tape] Rash   Family History  Problem Relation Age of Onset  . Diabetes Father   . Hypertension Father   . Sickle cell anemia Brother   . Cancer Maternal Grandmother     breast cancer  . Cancer Brother 15    Leukemia, passed away at 36   Social History   Social History  . Marital Status: Married    Spouse Name: N/A  . Number of Children: N/A  . Years of Education: Masters    Occupational History  . Teacher    Social History Main Topics  . Smoking status: Never Smoker   . Smokeless tobacco: Never Used  . Alcohol Use: 0.0 oz/week    0 Standard drinks or equivalent per week     Comment: social  . Drug Use: No  . Sexual Activity: Yes   Other Topics Concern  . None   Social History Narrative   Patient has had 2 pregnancies and has 2 living children.     Review of Systems  Constitutional: Positive for activity change and appetite change. Negative for fever, chills, diaphoresis, fatigue and unexpected weight change (trying very hard and working!).  Eyes: Negative for visual disturbance.  Respiratory: Negative for cough and shortness of breath.   Cardiovascular: Negative for chest pain, palpitations and leg swelling.  Genitourinary: Negative for decreased urine volume.  Neurological: Negative for syncope and headaches.  Hematological: Does not bruise/bleed easily.  Psychiatric/Behavioral: Negative for sleep disturbance.       Objective:  BP 112/78 mmHg  Pulse 95  Temp(Src) 98 F (36.7 C) (Oral)  Resp 16  Ht 5\' 5"  (1.651 m)  Wt 220 lb 12.8 oz (100.154 kg)  BMI 36.74 kg/m2  SpO2 97%  Physical Exam  Constitutional: She is oriented to person, place, and time. She appears well-developed and well-nourished. No distress.  HENT:  Head: Normocephalic and atraumatic.  Right Ear: External ear normal.  Left Ear: External ear normal.    Eyes: Conjunctivae are normal. No scleral icterus.  Neck: Normal range of motion. Neck supple. No thyromegaly present.  Cardiovascular: Normal rate, regular rhythm, normal heart sounds and intact distal pulses.   Pulmonary/Chest: Effort normal and breath sounds normal. No respiratory distress.  Musculoskeletal: She exhibits no edema.  Lymphadenopathy:    She has no cervical adenopathy.  Neurological: She is alert and oriented to person, place, and time.  Skin: Skin is warm and dry. She is not diaphoretic. No erythema.  Psychiatric: She has a normal mood and affect. Her behavior is normal.          Assessment & Plan:  CPE done 07/2015 1. Obesity (BMI 35.0-39.9 without comorbidity) (HCC)   2. Type 2 diabetes mellitus without complication, without long-term current use of insulin (HCC)   3. Essential hypertension   4. Medication monitoring encounter     Orders Placed This Encounter  Procedures  . Comprehensive metabolic panel  . POCT glycosylated hemoglobin (Hb A1C)    Meds ordered this encounter  Medications  . phentermine 15 MG capsule    Sig: TAKE ONE CAPSULE BY MOUTH EVERY MORNING AND TAKE 1 CAPSULE BEFORE LUNCH AS DIRECTED    Dispense:  60 capsule    Refill:  5    Norberto SorensonEva Shaw, MD MPH

## 2016-01-23 NOTE — Patient Instructions (Addendum)
IF you received an x-ray today, you will receive an invoice from [facility]. Please contact [facility] at [phone] with questions or concerns regarding your invoice.   IF you received labwork today, you will receive an invoice from [facility]. Please contact [facility] at [phone] with questions or concerns regarding your invoice.   Our billing staff will not be able to assist you with questions regarding bills from these companies.  You will be contacted with the lab results as soon as they are available. The fastest way to get your results is to activate your My Chart account. Instructions are located on the last page of this paperwork. If you have not heard from us regarding the results in 2 weeks, please contact this office.    Calorie Counting for Weight Loss Calories are energy you get from the things you eat and drink. Your body uses this energy to keep you going throughout the day. The number of calories you eat affects your weight. When you eat more calories than your body needs, your body stores the extra calories as fat. When you eat fewer calories than your body needs, your body burns fat to get the energy it needs. Calorie counting means keeping track of how many calories you eat and drink each day. If you make sure to eat fewer calories than your body needs, you should lose weight. In order for calorie counting to work, you will need to eat the number of calories that are right for you in a day to lose a healthy amount of weight per week. A healthy amount of weight to lose per week is usually 1-2 lb (0.5-0.9 kg). A dietitian can determine how many calories you need in a day and give you suggestions on how to reach your calorie goal.  WHAT IS MY MY PLAN? My goal is to have __________ calories per day.  If I have this many calories per day, I should lose around __________ pounds per week. WHAT DO I NEED TO KNOW ABOUT CALORIE  COUNTING? In order to meet your daily calorie goal, you will need to:  Find out how many calories are in each food you would like to eat. Try to do this before you eat.  Decide how much of the food you can eat.  Write down what you ate and how many calories it had. Doing this is called keeping a food log. WHERE DO I FIND CALORIE INFORMATION? The number of calories in a food can be found on a Nutrition Facts label. Note that all the information on a label is based on a specific serving of the food. If a food does not have a Nutrition Facts label, try to look up the calories online or ask your dietitian for help. HOW DO I DECIDE HOW MUCH TO EAT? To decide how much of the food you can eat, you will need to consider both the number of calories in one serving and the size of one serving. This information can be found on the Nutrition Facts label. If a food does not have a Nutrition Facts label, look up the information online or ask your dietitian for help. Remember that calories are listed per serving. If you choose to have more than one serving of a food, you will have to multiply the calories per serving by the amount of servings you plan to eat. For example, the label on a package of bread might say that a serving size  is 1 slice and that there are 90 calories in a serving. If you eat 1 slice, you will have eaten 90 calories. If you eat 2 slices, you will have eaten 180 calories. HOW DO I KEEP A FOOD LOG? After each meal, record the following information in your food log:  What you ate.  How much of it you ate.  How many calories it had.  Then, add up your calories. Keep your food log near you, such as in a small notebook in your pocket. Another option is to use a mobile app or website. Some programs will calculate calories for you and show you how many calories you have left each time you add an item to the log. WHAT ARE SOME CALORIE COUNTING TIPS?  Use your calories on foods and drinks that  will fill you up and not leave you hungry. Some examples of this include foods like nuts and nut butters, vegetables, lean proteins, and high-fiber foods (more than 5 g fiber per serving).  Eat nutritious foods and avoid empty calories. Empty calories are calories you get from foods or beverages that do not have many nutrients, such as candy and soda. It is better to have a nutritious high-calorie food (such as an avocado) than a food with few nutrients (such as a bag of chips).  Know how many calories are in the foods you eat most often. This way, you do not have to look up how many calories they have each time you eat them.  Look out for foods that may seem like low-calorie foods but are really high-calorie foods, such as baked goods, soda, and fat-free candy.  Pay attention to calories in drinks. Drinks such as sodas, specialty coffee drinks, alcohol, and juices have a lot of calories yet do not fill you up. Choose low-calorie drinks like water and diet drinks.  Focus your calorie counting efforts on higher calorie items. Logging the calories in a garden salad that contains only vegetables is less important than calculating the calories in a milk shake.  Find a way of tracking calories that works for you. Get creative. Most people who are successful find ways to keep track of how much they eat in a day, even if they do not count every calorie. WHAT ARE SOME PORTION CONTROL TIPS?  Know how many calories are in a serving. This will help you know how many servings of a certain food you can have.  Use a measuring cup to measure serving sizes. This is helpful when you start out. With time, you will be able to estimate serving sizes for some foods.  Take some time to put servings of different foods on your favorite plates, bowls, and cups so you know what a serving looks like.  Try not to eat straight from a bag or box. Doing this can lead to overeating. Put the amount you would like to eat in a  cup or on a plate to make sure you are eating the right portion.  Use smaller plates, glasses, and bowls to prevent overeating. This is a quick and easy way to practice portion control. If your plate is smaller, less food can fit on it.  Try not to multitask while eating, such as watching TV or using your computer. If it is time to eat, sit down at a table and enjoy your food. Doing this will help you to start recognizing when you are full. It will also make you more aware of what  and how much you are eating. HOW CAN I CALORIE COUNT WHEN EATING OUT?  Ask for smaller portion sizes or child-sized portions.  Consider sharing an entree and sides instead of getting your own entree.  If you get your own entree, eat only half. Ask for a box at the beginning of your meal and put the rest of your entree in it so you are not tempted to eat it.  Look for the calories on the menu. If calories are listed, choose the lower calorie options.  Choose dishes that include vegetables, fruits, whole grains, low-fat dairy products, and lean protein. Focusing on smart food choices from each of the 5 food groups can help you stay on track at restaurants.  Choose items that are boiled, broiled, grilled, or steamed.  Choose water, milk, unsweetened iced tea, or other drinks without added sugars. If you want an alcoholic beverage, choose a lower calorie option. For example, a regular margarita can have up to 700 calories and a glass of wine has around 150.  Stay away from items that are buttered, battered, fried, or served with cream sauce. Items labeled "crispy" are usually fried, unless stated otherwise.  Ask for dressings, sauces, and syrups on the side. These are usually very high in calories, so do not eat much of them.  Watch out for salads. Many people think salads are a healthy option, but this is often not the case. Many salads come with bacon, fried chicken, lots of cheese, fried chips, and dressing. All of  these items have a lot of calories. If you want a salad, choose a garden salad and ask for grilled meats or steak. Ask for the dressing on the side, or ask for olive oil and vinegar or lemon to use as dressing.  Estimate how many servings of a food you are given. For example, a serving of cooked rice is  cup or about the size of half a tennis ball or one cupcake wrapper. Knowing serving sizes will help you be aware of how much food you are eating at restaurants. The list below tells you how big or small some common portion sizes are based on everyday objects.  1 oz--4 stacked dice.  3 oz--1 deck of cards.  1 tsp--1 dice.  1 Tbsp-- a Ping-Pong ball.  2 Tbsp--1 Ping-Pong ball.   cup--1 tennis ball or 1 cupcake wrapper.  1 cup--1 baseball.   This information is not intended to replace advice given to you by your health care provider. Make sure you discuss any questions you have with your health care provider.   Document Released: 09/14/2005 Document Revised: 10/05/2014 Document Reviewed: 07/20/2013 Elsevier Interactive Patient Education [employer]

## 2016-01-24 ENCOUNTER — Other Ambulatory Visit: Payer: Self-pay

## 2016-01-24 DIAGNOSIS — Z1231 Encounter for screening mammogram for malignant neoplasm of breast: Secondary | ICD-10-CM

## 2016-02-11 ENCOUNTER — Telehealth: Payer: Self-pay

## 2016-02-11 ENCOUNTER — Ambulatory Visit
Admission: RE | Admit: 2016-02-11 | Discharge: 2016-02-11 | Disposition: A | Discharge status: Home / Self Care | Payer: BC Managed Care – PPO | Source: Ambulatory Visit

## 2016-02-11 DIAGNOSIS — Z1231 Encounter for screening mammogram for malignant neoplasm of breast: Secondary | ICD-10-CM

## 2016-02-11 NOTE — Telephone Encounter (Signed)
Pt called saying she needs a refill on her phentermine. Pt says [facility] has sent multiple requests. Pt says rx will need another prior authorization.

## 2016-02-13 NOTE — Telephone Encounter (Signed)
Rxs were already sent for pt at 4/27 OV. I have not seen a req from [facility] yet, but completed PA on covermymeds (just renewed the one from Jan). However, form then asked if pt has received 3 mos of Rx w/in the last 365 days. It sounds as if this is all they will cover. I called and notified pt of status and that ins may not cover any more this year, but that I will advise her when I get decision back from ins.

## 2016-02-14 NOTE — Telephone Encounter (Signed)
Thank you.  It is not to expensive so if her insurance won't cover, she can check on goodrx for a coupon and then pay for it out of pocket.

## 2016-02-14 NOTE — Telephone Encounter (Signed)
PA was denied for reason below. Notified pt about GoodRx coupon and she was aware of this and had been able to get Rx for under $40 .

## 2016-03-02 ENCOUNTER — Other Ambulatory Visit: Payer: Self-pay | Admitting: Family Medicine

## 2016-04-15 ENCOUNTER — Ambulatory Visit (INDEPENDENT_AMBULATORY_CARE_PROVIDER_SITE_OTHER): Payer: BC Managed Care – PPO

## 2016-04-15 ENCOUNTER — Encounter: Payer: Self-pay | Admitting: Family Medicine

## 2016-04-15 ENCOUNTER — Ambulatory Visit (INDEPENDENT_AMBULATORY_CARE_PROVIDER_SITE_OTHER): Payer: BC Managed Care – PPO | Admitting: Family Medicine

## 2016-04-15 VITALS — BP 110/76 | HR 95 | Temp 97.6°F | Resp 16 | Ht 65.0 in | Wt 214.4 lb

## 2016-04-15 DIAGNOSIS — S161XXA Strain of muscle, fascia and tendon at neck level, initial encounter: Secondary | ICD-10-CM

## 2016-04-15 DIAGNOSIS — S46911A Strain of unspecified muscle, fascia and tendon at shoulder and upper arm level, right arm, initial encounter: Secondary | ICD-10-CM

## 2016-04-15 DIAGNOSIS — E119 Type 2 diabetes mellitus without complications: Secondary | ICD-10-CM | POA: Diagnosis not present

## 2016-04-15 LAB — GLUCOSE, POCT (MANUAL RESULT ENTRY): POC Glucose: 109 mg/dl — AB (ref 70–99)

## 2016-04-15 MED ORDER — METHOCARBAMOL 500 MG PO TABS: 500.0000 mg | ORAL_TABLET | Freq: Four times a day (QID) | ORAL | Status: DC | PRN

## 2016-04-15 MED ORDER — TRAMADOL HCL 50 MG PO TABS: 50.0000 mg | ORAL_TABLET | Freq: Four times a day (QID) | ORAL | Status: DC | PRN

## 2016-04-15 NOTE — Patient Instructions (Addendum)
   IF you received an x-ray today, you will receive an invoice from [facility]. Please contact [facility] at [phone] with questions or concerns regarding your invoice.   IF you received labwork today, you will receive an invoice from [facility]. Please contact [facility] at [phone] with questions or concerns regarding your invoice.   Our billing staff will not be able to assist you with questions regarding bills from these companies.  You will be contacted with the lab results as soon as they are available. The fastest way to get your results is to activate your My Chart account. Instructions are located on the last page of this paperwork. If you have not heard from us regarding the results in 2 weeks, please contact this office.       Scapular Winging With Rehab Scapular winging syndrome is also known as serratus anterior palsy or long thoracic nerve injury. The condition is an uncommon injury to the nervous system. The condition is caused by injury to the long thoracic nerve that runs through the neck and shoulder. Injury to the shoulder, such as a fall or repetitive stress on the shoulder causes the nerve to become stretched. Occasionally the injury is the result of an infection of the nerve. Damage to the long thoracic nerve results in weakness of the serratus anterior muscle. The serratus anterior muscle is responsible for controlling the shoulder blade (scapula). Weakness in this muscle results in a instability (winging) of the scapula. SYMPTOMS   Pain and weakness in the shoulder (usually the back of the shoulder) that is often diffuse or unable to localize.  Loss of or decrease in shoulder function.  Upper back pain while sitting, due to the scapula pressing on the back of the chair.  Visible deformity in the back of the shoulder. CAUSES  Scapular winging is caused by stretching of the long thoracic nerve. Common mechanisms  of injury include:  Viral illness.  Repetitive and/or stressful use of the shoulder.  Falling onto the shoulder with the head and neck stretched away from the shoulder. RISK INCREASES WITH:  Contact sports (football, rugby, lacrosse, or soccer).  Activities involving overhead arm movement (baseball, volleyball, or racquet sports).  Poor strength and flexibility. PREVENTION  Warm up and stretch properly before activity.  Allow for adequate recovery between workouts.  Maintain physical fitness:  Strength, flexibility, and endurance.  Cardiovascular fitness.  Learn and use proper technique. When possible, have a coach correct improper technique. PROGNOSIS  Scapular winging normally resolves spontaneously within 18 months. In rare circumstances surgery is recommended.  RELATED COMPLICATIONS   Permanent nerve damage, including pain, numbness, tingle, or weakness.  Shoulder weakness.  Recurrent shoulder pain.  Inability to compete in athletics. TREATMENT Treatment initially involves resting from any activities that aggravate your symptoms. The use of ice and medication may help reduce pain and inflammation. The use of strengthening and stretching exercises may help reduce pain with activity, specifically shoulder exercises that improve range of motion. These exercises may be performed at home or with referral to a therapist. If symptoms persist for greater than 6 months despite non-surgical (conservative) treatment, then surgery may be recommended. Surgery is only used for the most serious cases and the purpose is to regain function, not to allow an athlete to return to sports. MEDICATION   If pain medication is necessary, then nonsteroidal anti-inflammatory medications, such as aspirin and ibuprofen, or other minor pain relievers, such as acetaminophen, are often recommended.    Do not take pain medication for 7 days before surgery.  Prescription pain relievers may be given if  deemed necessary by your caregiver. Use only as directed and only as much as you need. HEAT AND COLD  Cold treatment (icing) relieves pain and reduces inflammation. Cold treatment should be applied for 10 to 15 minutes every 2 to 3 hours for inflammation and pain and immediately after any activity that aggravates your symptoms. Use ice packs or massage the area with a piece of ice (ice massage).  Heat treatment may be used prior to performing the stretching and strengthening activities prescribed by your caregiver, physical therapist, or athletic trainer. Use a heat pack or soak the injury in warm water. SEEK MEDICAL CARE IF:  Treatment seems to offer no benefit, or the condition worsens.  Any medications produce adverse side effects. EXERCISES  RANGE OF MOTION (ROM) AND STRETCHING EXERCISES - Scapular Winging (Serratus Anterior Palsy, Long Thoracic Nerve Injury)  These exercises may help you when beginning to rehabilitate your injury. Your symptoms may resolve with or without further involvement from your physician, physical therapist or athletic trainer. While completing these exercises, remember:   Restoring tissue flexibility helps normal motion to return to the joints. This allows healthier, less painful movement and activity.  An effective stretch should be held for at least 30 seconds.  A stretch should never be painful. You should only feel a gentle lengthening or release in the stretched tissue. ROM - Pendulum  Bend at the waist so that your right / left arm falls away from your body. Support yourself with your opposite hand on a solid surface, such as a table or a countertop.  Your right / left arm should be perpendicular to the ground. If it is not perpendicular, you need to lean over farther. Relax the muscles in your right / left arm and shoulder as much as possible.  Gently sway your hips and trunk so they move your right / left arm without any use of your right / left  shoulder muscles.  Progress your movements so that your right / left arm moves side to side, then forward and backward, and finally, both clockwise and counterclockwise.  Complete __________ repetitions in each direction. Many people use this exercise to relieve discomfort in their shoulder as well as to gain range of motion. Repeat __________ times. Complete this exercise __________ times per day. STRETCH - Flexion, Seated   Sit in a firm chair so that your right / left forearm can rest on a table or on a table or countertop. Your right / left elbow should rest below the height of your shoulder so that your shoulder feels supported and not tense or uncomfortable.  Keeping your right / left shoulder relaxed, lean forward at your waist, allowing your right / left hand to slide forward. Bend forward until you feel a moderate stretch in your shoulder, but before you feel an increase in your pain.  Hold __________ seconds. Slowly return to your starting position. Repeat __________ times. Complete this exercise __________ times per day.  STRETCH - Flexion, Standing  Stand with good posture. With an underhand grip on your right / left and an overhand grip on the opposite hand, grasp a broomstick or cane so that your hands are a little more than shoulder-width apart.  Keeping your right / left elbow straight and shoulder muscles relaxed, push the stick with your opposite hand to raise your right / left arm in front of   your body and then overhead. Raise your arm until you feel a stretch in your right / left shoulder, but before you have increased shoulder pain.  Avoid shrugging your right / left shoulder as your arm rises by keeping your shoulder blade tucked down and toward your mid-back spine. Hold __________ seconds.  Slowly return to the starting position. Repeat __________ times. Complete this exercise __________ times per day. STRETCH - Abduction, Supine  Stand with good posture. With an  underhand grip on your right / left and an overhand grip on the opposite hand, grasp a broomstick or cane so that your hands are a little more than shoulder-width apart.  Keeping your right / left elbow straight and shoulder muscles relaxed, push the stick with your opposite hand to raise your right / left arm out to the side of your body and then overhead. Raise your arm until you feel a stretch in your right / left shoulder, but before you have increased shoulder pain.  Avoid shrugging your right / left shoulder as your arm rises by keeping your shoulder blade tucked down and toward your mid-back spine. Hold __________ seconds.  Slowly return to the starting position. Repeat __________ times. Complete this exercise __________ times per day. ROM - Flexion, Active-Assisted  Lie on your back. You may bend your knees for comfort.  Grasp a broomstick or cane so your hands are about shoulder-width apart. Your right / left hand should grip the end of the stick/cane so that your hand is positioned "thumbs-up," as if you were about to shake hands.  Using your healthy arm to lead, raise your right / left arm overhead until you feel a gentle stretch in your shoulder. Hold __________ seconds.  Use the stick/cane to assist in returning your right / left arm to its starting position. Repeat __________ times. Complete this exercise __________ times per day.  STRENGTHENING EXERCISES - Scapular Winging (Serratus Anterior Palsy, Long Thoracic Nerve Injury) These exercises may help you when beginning to rehabilitate your injury. They may resolve your symptoms with or without further involvement from your physician, physical therapist or athletic trainer. While completing these exercises, remember:   Muscles can gain both the endurance and the strength needed for everyday activities through controlled exercises.  Complete these exercises as instructed by your physician, physical therapist or athletic trainer.  Progress with the resistance and repetition exercises only as your caregiver advises.  You may experience muscle soreness or fatigue, but the pain or discomfort you are trying to eliminate should never worsen during these exercises. If this pain does worsen, stop and make certain you are following the directions exactly. If the pain is still present after adjustments, discontinue the exercise until you can discuss the trouble with your clinician.  During your recovery, avoid activity or exercises which involve actions that place your injured hand or elbow above your head or behind your back or head. These positions stress the tissues which are trying to heal. STRENGTH - Scapular Depression and Adduction   With good posture, sit on a firm chair. Supported your arms in front of you with pillows, arm rests or a table top. Have your elbows in line with the sides of your body.  Gently draw your shoulder blades down and toward your mid-back spine. Gradually increase the tension without tensing the muscles along the top of your shoulders and the back of your neck.  Hold for __________ seconds. Slowly release the tension and relax your muscles completely before   completing the next repetition.  After you have practiced this exercise, remove the arm support and complete it in standing as well as sitting. Repeat __________ times. Complete this exercise __________ times per day.  STRENGTH - Scapular Protractors, Standing   Stand arms-length away from a wall. Place your hands on the wall, keeping your elbows straight.  Begin by dropping your shoulder blades down and toward your mid-back spine.  To strengthen your protractors, keep your shoulder blades down, but slide them forward on your rib cage. It will feel as if you are lifting the back of your rib cage away from the wall. This is a subtle motion and can be challenging to complete. Ask your clinician for further instruction if you are not sure you are  doing the exercise correctly.  Hold for __________ seconds. Slowly return to the starting position, resting the muscles completely before completing the next repetition. Repeat __________ times. Complete this exercise __________ times per day. STRENGTH - Scapular Protractors, Supine  Lie on your back on a firm surface. Extend your right / left arm straight into the air while holding a __________ weight in your hand.  Keeping your head and back in place, lift your shoulder off the floor.  Hold __________ seconds. Slowly return to the starting position and allow your muscles to relax completely before completing the next repetition. Repeat __________ times. Complete this exercise __________ times per day. STRENGTH - Scapular Protractors, Quadruped  Get onto your hands and knees with your shoulders directly over your hands (or as close as you comfortably can be).  Keeping your elbows locked, lift the back of your rib cage up into your shoulder blades so your mid-back rounds-out. Keep your neck muscles relaxed.  Hold this position for __________ seconds. Slowly return to the starting position and allow your muscles to relax completely before completing the next repetition. Repeat __________ times. Complete this exercise __________ times per day.  STRENGTH - Scapular Depressors  Keeping your feet on the floor, lift your bottom from the seat and lock your elbows.  Keeping your elbows straight, allow gravity to pull your body weight down. Your shoulders will rise toward your ears.  Raise your body against gravity by drawing your shoulder blades down your back, shortening the distance between your shoulders and ears. Although your feet should always maintain contact with the floor, your feet should progressively support less body weight as you get stronger.  Hold __________ seconds. In a controlled and slow manner, lower your body weight to begin the next repetition. Repeat __________ times.  Complete this exercise __________ times per day.  STRENGTH - Shoulder Extensors, Prone  Lie on your stomach on a firm surface so that your right / left arm overhangs the edge. Rest your forehead on your opposite forearm. With your thumb facing away from your body and your elbow straight, hold a __________ weight in your hand.  Squeeze your right / left shoulder blade to your mid-back spine and then slowly raise your arm behind you to the height of the bed.  Hold for __________ seconds. Slowly reverse the directions and return to the starting position, controlling the weight as you lower your arm. Repeat __________ times. Complete this exercise __________ times per day.  STRENGTH - Horizontal Abductors Choose one of the two oppositions to complete this exercise. Prone: lying on stomach:  Lie on your stomach on a firm surface so that your right / left arm overhangs the edge. Rest your forehead on   your opposite forearm. With your palm facing the floor and your elbow straight, hold a __________ weight in your hand.  Squeeze your right / left shoulder blade to your mid-back spine and then slowly raise your arm to the height of the bed.  Hold for __________ seconds. Slowly reverse the directions and return to the starting position, controlling the weight as you lower your arm. Repeat __________ times. Complete this exercise __________ times per day. Standing:  Secure a rubber exercise band/tubing so that it is at the height of your shoulders when you are either standing or sitting on a firm arm-less chair.  Grasp an end of the band/tubing in each hand and have your palms face each other. Straighten your elbows and lift your hands straight in front of you at shoulder height. Step back away from the secured end of band/tubing until it becomes tense.  Squeeze your shoulder blades together. Keeping your elbows locked and your hands at shoulder-height, bring your hands out to your side.  Hold  __________ seconds. Slowly ease the tension on the band/tubing as you reverse the directions and return to the starting position. Repeat __________ times. Complete this exercise __________ times per day. STRENGTH - Scapular Retractors  Secure a rubber exercise band/tubing so that it is at the height of your shoulders when you are either standing or sitting on a firm arm-less chair.  With a palm-down grip, grasp an end of the band/tubing in each hand. Straighten your elbows and lift your hands straight in front of you at shoulder height. Step back away from the secured end of band/tubing until it becomes tense.  Squeezing your shoulder blades together, draw your elbows back as you bend them. Keep your upper arm lifted away from your body throughout the exercise.  Hold __________ seconds. Slowly ease the tension on the band/tubing as you reverse the directions and return to the starting position. Repeat __________ times. Complete this exercise __________ times per day. STRENGTH - Shoulder Extensors   Secure a rubber exercise band/tubing so that it is at the height of your shoulders when you are either standing or sitting on a firm arm-less chair.  With a thumbs-up grip, grasp an end of the band/tubing in each hand. Straighten your elbows and lift your hands straight in front of you at shoulder height. Step back away from the secured end of band/tubing until it becomes tense.  Squeezing your shoulder blades together, pull your hands down to the sides of your thighs. Do not allow your hands to go behind you.  Hold for __________ seconds. Slowly ease the tension on the band/tubing as you reverse the directions and return to the starting position. Repeat __________ times. Complete this exercise __________ times per day.  STRENGTH - Scapular Retractors and External Rotators  Secure a rubber exercise band/tubing so that it is at the height of your shoulders when you are either standing or sitting on  a firm arm-less chair.  With a palm-down grip, grasp an end of the band/tubing in each hand. Bend your elbows 90 degrees and lift your elbows to shoulder height at your sides. Step back away from the secured end of band/tubing until it becomes tense.  Squeezing your shoulder blades together, rotate your shoulder so that your upper arm and elbow remain stationary, but your fists travel upward to head-height.  Hold __________ for seconds. Slowly ease the tension on the band/tubing as you reverse the directions and return to the starting position. Repeat __________   times. Complete this exercise __________ times per day.  STRENGTH - Scapular Retractors and External Rotators, Rowing  Secure a rubber exercise band/tubing so that it is at the height of your shoulders when you are either standing or sitting on a firm arm-less chair.  With a palm-down grip, grasp an end of the band/tubing in each hand. Straighten your elbows and lift your hands straight in front of you at shoulder height. Step back away from the secured end of band/tubing until it becomes tense.  Step 1: Squeeze your shoulder blades together. Bending your elbows, draw your hands to your chest as if you are rowing a boat. At the end of this motion, your hands and elbow should be at shoulder-height and your elbows should be out to your sides.  Step 2: Rotate your shoulder to raise your hands above your head. Your forearms should be vertical and your upper-arms should be horizontal.  Hold for __________ seconds. Slowly ease the tension on the band/tubing as you reverse the directions and return to the starting position. Repeat __________ times. Complete this exercise __________ times per day.  STRENGTH - Scapular Retractors and Elevators  Secure a rubber exercise band/tubing so that it is at the height of your shoulders when you are either standing or sitting on a firm arm-less chair.  With a thumbs-up grip, grasp an end of the  band/tubing in each hand. Step back away from the secured end of band/tubing until it becomes tense.  Squeezing your shoulder blades together, straighten your elbows and lift your hands straight over your head.  Hold for __________ seconds. Slowly ease the tension on the band/tubing as you reverse the directions and return to the starting position. Repeat __________ times. Complete this exercise __________ times per day.    This information is not intended to replace advice given to you by your health care provider. Make sure you discuss any questions you have with your health care provider.   Document Released: 09/14/2005 Document Revised: 01/29/2015 Document Reviewed: 12/27/2008 [employer] Interactive Patient Education 2016 [employer]  

## 2016-04-15 NOTE — Progress Notes (Signed)
Subjective:    Patient ID: Katherine [name], female    DOB: 04/06/1956, 60 y.o.   MRN: [id number]  04/15/2016  right shoulder blade pain (x 3 wks) and arm pain (right bicep started last night 04/14/16 )   HPI This 60 y.o. female presents for evaluation of R shoulder pain and shoulder blade pain. Onset three weeks ago.  Mild neck pain on R in bed. If moves certain way,has sharp pain.  L arm now also hurting.  No injury.  No n/t/burning in arms.  Was coughing which worsened pain; cough now resolved.  No SOB.  Retired Runner, broadcasting/film/video.  Took Mucinex for cough; last night took remaining Tramadol with some relief.  No heat or ice.     Review of Systems  Constitutional: Negative for fever, chills, diaphoresis and fatigue.  Eyes: Negative for visual disturbance.  Respiratory: Negative for cough and shortness of breath.   Cardiovascular: Negative for chest pain, palpitations and leg swelling.  Gastrointestinal: Negative for nausea, vomiting, abdominal pain, diarrhea and constipation.  Endocrine: Negative for cold intolerance, heat intolerance, polydipsia, polyphagia and polyuria.  Musculoskeletal: Positive for arthralgias, myalgias, neck pain and neck stiffness.  Neurological: Negative for dizziness, tremors, seizures, syncope, facial asymmetry, speech difficulty, weakness, light-headedness, numbness and headaches.    Past Medical History:  Diagnosis Date  . Hypertension    Past Surgical History:  Procedure Laterality Date  . CHOLECYSTECTOMY    . rotator cuff surgery     Allergies  Allergen Reactions  . Ace Inhibitors Cough  . Adhesive [Tape] Rash    Social History   Social History  . Marital status: Married    Spouse name: N/A  . Number of children: N/A  . Years of education: Masters    Occupational History  . Teacher    Social History Main Topics  . Smoking status: Never Smoker  . Smokeless tobacco: Never Used  . Alcohol use 0.0 oz/week     Comment: social  . Drug use: No  .  Sexual activity: Yes   Other Topics Concern  . Not on file   Social History Narrative   Patient has had 2 pregnancies and has 2 living children.   Family History  Problem Relation Age of Onset  . Diabetes Father   . Hypertension Father   . Sickle cell anemia Brother   . Cancer Maternal Grandmother     breast cancer  . Cancer Brother 15    Leukemia, passed away at 16       Objective:    BP 110/76   Pulse 95   Temp 97.6 F (36.4 C) (Oral)   Resp 16   Ht  (1.651 m)   Wt 214 lb 6.4 oz (97.3 kg)   SpO2 97%   BMI 35.68 kg/m  Physical Exam  Constitutional: She is oriented to person, place, and time. She appears well-developed and well-nourished. No distress.  HENT:  Head: Normocephalic and atraumatic.  Right Ear: External ear normal.  Left Ear: External ear normal.  Nose: Nose normal.  Mouth/Throat: Oropharynx is clear and moist.  Eyes: Conjunctivae and EOM are normal. Pupils are equal, round, and reactive to light.  Neck: Normal range of motion. Neck supple. Carotid bruit is not present. No thyromegaly present.  Cardiovascular: Normal rate, regular rhythm, normal heart sounds and intact distal pulses.  Exam reveals no gallop and no friction rub.   No murmur heard. Pulmonary/Chest: Effort normal and breath sounds normal. She has no wheezes.  She has no rales.  Abdominal: Soft. Bowel sounds are normal.  Musculoskeletal:       Right shoulder: She exhibits pain and spasm. She exhibits normal range of motion, normal pulse and normal strength.       Cervical back: She exhibits tenderness, pain and spasm. She exhibits normal range of motion and no bony tenderness.  Lymphadenopathy:    She has no cervical adenopathy.  Neurological: She is alert and oriented to person, place, and time. No cranial nerve deficit.  Skin: Skin is warm and dry. No rash noted. She is not diaphoretic. No erythema. No pallor.  Psychiatric: She has a normal mood and affect. Her behavior is normal.    Results for orders placed or performed in visit on 04/15/16  POCT glucose (manual entry)  Result Value Ref Range   POC Glucose 109 (A) 70 - 99 mg/dl       Assessment & Plan:   1. Neck strain, initial encounter   2. Muscle strain of scapular region, right, initial encounter   3. Type 2 diabetes mellitus without complication, without long-term current use of insulin (HCC)     Orders Placed This Encounter  Procedures  . DG Cervical Spine 2 or 3 views    Standing Status:   Future    Number of Occurrences:   1    Standing Expiration Date:   04/15/2017    Order Specific Question:   Reason for Exam (SYMPTOM  OR DIAGNOSIS REQUIRED)    Answer:   R neck pain; R thoracic pain    Order Specific Question:   Preferred imaging location?    Answer:   External  . DG Thoracic Spine 2 View    Standing Status:   Future    Number of Occurrences:   1    Standing Expiration Date:   04/15/2017    Order Specific Question:   Reason for Exam (SYMPTOM  OR DIAGNOSIS REQUIRED)    Answer:   R neck pain; R thoracic pain    Order Specific Question:   Preferred imaging location?    Answer:   External  . POCT glucose (manual entry)   Meds ordered this encounter  Medications  . methocarbamol (ROBAXIN) 500 MG tablet    Sig: Take 1 tablet (500 mg total) by mouth every 6 (six) hours as needed for muscle spasms.    Dispense:  40 tablet    Refill:  0  . traMADol (ULTRAM) 50 MG tablet    Sig: Take 1 tablet (50 mg total) by mouth every 6 (six) hours as needed.    Dispense:  40 tablet    Refill:  0    No [name] on file.    Alvaro Aungst Paulita FujitaMartin Tanaja Ganger, M.D. [facility]  [place] [address] [place], [place]  [id number] [phone] phone [phone] fax

## 2016-06-02 ENCOUNTER — Other Ambulatory Visit: Payer: Self-pay | Admitting: Family Medicine

## 2016-06-20 ENCOUNTER — Other Ambulatory Visit: Payer: Self-pay | Admitting: Family Medicine

## 2016-06-23 NOTE — Telephone Encounter (Signed)
Pharmacy is calling because the patient is completey out of medication and needs a refill soon as possible

## 2016-07-23 ENCOUNTER — Encounter: Payer: Self-pay | Admitting: Family Medicine

## 2016-07-23 ENCOUNTER — Ambulatory Visit (INDEPENDENT_AMBULATORY_CARE_PROVIDER_SITE_OTHER): Payer: BC Managed Care – PPO | Admitting: Family Medicine

## 2016-07-23 VITALS — BP 128/84 | HR 100 | Temp 98.2°F | Resp 16 | Wt 216.0 lb

## 2016-07-23 DIAGNOSIS — Z5181 Encounter for therapeutic drug level monitoring: Secondary | ICD-10-CM | POA: Diagnosis not present

## 2016-07-23 DIAGNOSIS — I10 Essential (primary) hypertension: Secondary | ICD-10-CM | POA: Diagnosis not present

## 2016-07-23 DIAGNOSIS — Z23 Encounter for immunization: Secondary | ICD-10-CM | POA: Diagnosis not present

## 2016-07-23 DIAGNOSIS — E119 Type 2 diabetes mellitus without complications: Secondary | ICD-10-CM | POA: Diagnosis not present

## 2016-07-23 DIAGNOSIS — E669 Obesity, unspecified: Secondary | ICD-10-CM

## 2016-07-23 LAB — COMPREHENSIVE METABOLIC PANEL
ALT: 15 U/L (ref 6–29)
AST: 15 U/L (ref 10–35)
Albumin: 4.1 g/dL (ref 3.6–5.1)
Alkaline Phosphatase: 90 U/L (ref 33–130)
BILIRUBIN TOTAL: 0.6 mg/dL (ref 0.2–1.2)
BUN: 15 mg/dL (ref 7–25)
CALCIUM: 9.6 mg/dL (ref 8.6–10.4)
CO2: 24 mmol/L (ref 20–31)
Chloride: 102 mmol/L (ref 98–110)
Creat: 0.97 mg/dL (ref 0.50–0.99)
GLUCOSE: 120 mg/dL — AB (ref 65–99)
Potassium: 3.7 mmol/L (ref 3.5–5.3)
SODIUM: 138 mmol/L (ref 135–146)
Total Protein: 7.4 g/dL (ref 6.1–8.1)

## 2016-07-23 LAB — POCT GLYCOSYLATED HEMOGLOBIN (HGB A1C): HEMOGLOBIN A1C: 6.1

## 2016-07-23 MED ORDER — METFORMIN HCL 500 MG PO TABS
500.0000 mg | ORAL_TABLET | Freq: Two times a day (BID) | ORAL | 3 refills | Status: DC
Start: 2016-07-23 — End: 2017-09-16

## 2016-07-23 MED ORDER — AMLODIPINE BESYLATE 5 MG PO TABS: 5.0000 mg | ORAL_TABLET | Freq: Every day | ORAL | 3 refills | Status: DC

## 2016-07-23 MED ORDER — PHENTERMINE HCL 15 MG PO CAPS: ORAL_CAPSULE | ORAL | 5 refills | Status: DC

## 2016-07-23 NOTE — Progress Notes (Signed)
Subjective:    Patient ID: Katherine [name], female    DOB: 1955/11/21, 60 y.o.   MRN: [id number]  Chief Complaint  Patient presents with  . [name]    HPI  Katherine [name] is a 60 yo woman who is here for a 6 mo follow-up on her chronic medical conditions.  DMII:a1c 6.7 -> 6.3 on metformin 500 bid - new onset earlier this yr; nml microalb and lipid panel last year was close to goal; asa - none but will start; optho - at [facility] within the last year; allergic to acei  HTN: On amlodipine 5; does monitor BP at home and is well controlled  Obesity: Pt has been on metformin in addition to phentermine  Her husband is snoring a lot more. She is having to sleep later to get enough rest (after he leaves).  She is really minimizing her dinner.  Is still loosing inches and closthes size decreasing.  She walkes and is ging to get back on the elliptical. 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 Hypertension    Past Surgical History:  Procedure Laterality Date  . CHOLECYSTECTOMY    . rotator cuff surgery     Current Outpatient Prescriptions on File Prior to Visit  Medication Sig Dispense Refill  . Multiple Vitamin (MULTIVITAMIN) tablet Take 1 tablet by mouth daily.    . ranitidine (ZANTAC) 150 MG tablet TAKE ONE TABLET BY MOUTH TWICE DAILY AS NEEDED FOR  HEARTBURN 60 tablet 11  . vitamin B-12 (CYANOCOBALAMIN) 500 MCG tablet Take 500 mcg by mouth daily. Reported on 04/15/2016     No current facility-administered medications on file prior to visit.    Allergies  Allergen Reactions  . Ace Inhibitors Cough  . Adhesive [Tape] Rash   Family History  Problem Relation Age of Onset  . Diabetes Father   . Hypertension Father   . Sickle cell anemia Brother   . Cancer Maternal Grandmother     breast cancer  . Cancer Brother 15    Leukemia, passed away at 1316   Social History   Social History  . Marital status: Married    Spouse name:  N/A  . Number of children: N/A  . Years of education: Masters    Occupational History  . Teacher    Social History Main Topics  . Smoking status: Never Smoker  . Smokeless tobacco: Never Used  . Alcohol use 0.0 oz/week     Comment: social  . Drug use: No  . Sexual activity: Yes   Other Topics Concern  . None   Social History Narrative   Patient has had 2 pregnancies and has 2 living children.   [facility] 07/23/2016 04/15/2016 01/23/2016 09/26/2015 08/08/2015  Decreased Interest 0 0 0 0 0  Down, Depressed, Hopeless 0 0 0 0 0  PHQ - 2 Score 0 0 0 0 0     Review of Systems  Constitutional: Positive for activity change (increase). Negative for appetite change, chills, diaphoresis, fatigue and fever.  Eyes: Negative for visual disturbance.  Respiratory: Negative for cough and shortness of breath.   Cardiovascular: Negative for chest pain, palpitations and leg swelling.  Gastrointestinal: Negative for abdominal pain, nausea and vomiting.  Genitourinary: Negative for decreased urine volume.  Neurological: Negative for syncope and headaches.  Hematological: Does not bruise/bleed easily.  Psychiatric/Behavioral: Negative for agitation, behavioral problems, dysphoric mood and sleep disturbance. The patient is  not nervous/anxious.        Objective:   Physical Exam  Constitutional: She is oriented to person, place, and time. She appears well-developed and well-nourished. No distress.  HENT:  Head: Normocephalic and atraumatic.  Right Ear: External ear normal.  Left Ear: External ear normal.  Eyes: Conjunctivae are normal. No scleral icterus.  Neck: Normal range of motion. Neck supple. No thyromegaly present.  Cardiovascular: Normal rate, regular rhythm, normal heart sounds and intact distal pulses.   Pulmonary/Chest: Effort normal and breath sounds normal. No respiratory distress.  Musculoskeletal: She exhibits no edema.  Lymphadenopathy:    She has no cervical  adenopathy.  Neurological: She is alert and oriented to person, place, and time.  Skin: Skin is warm and dry. She is not diaphoretic. No erythema.  Psychiatric: She has a normal mood and affect. Her behavior is normal.      BP 128/84 (BP Location: Left Arm, Patient Position: Sitting, Cuff Size: Large)   Pulse 100   Temp 98.2 F (36.8 C)   Resp 16   Wt 216 lb (98 kg)   SpO2 97%   BMI 35.94 kg/m   Assessment & Plan:  Pt not fasting today so defer lipids 1. Type 2 diabetes mellitus without complication, without long-term current use of insulin (HCC)   2. Essential hypertension   3. Obesity (BMI 35.0-39.9 without comorbidity)   4. Medication monitoring encounter   5. Need for influenza vaccination    Doing really great with tlc - weight stable but Mirage looks great - has really slimmed down since last visit so clearly increasing in muscle.  She is very motivated to cont on her weight loss journey so refilled current regimen, recheck in 6 mos.  May want to consider tapering off phenteramine at that time.  Orders Placed This Encounter  Procedures  . Flu Vaccine QUAD 36+ mos IM  . Comprehensive metabolic panel    Order Specific Question:   Has the patient fasted?    Answer:   Yes  . Microalbumin/Creatinine Ratio, Urine  . POCT glycosylated hemoglobin (Hb A1C)    Meds ordered this encounter  Medications  . amLODipine (NORVASC) 5 MG tablet    Sig: Take 1 tablet (5 mg total) by mouth daily.    Dispense:  90 tablet    Refill:  3  . metFORMIN (GLUCOPHAGE) 500 MG tablet    Sig: Take 1 tablet (500 mg total) by mouth 2 (two) times daily with a meal.    Dispense:  180 tablet    Refill:  3  . phentermine 15 MG capsule    Sig: TAKE ONE CAPSULE BY MOUTH EVERY MORNING AND TAKE 1 CAPSULE BEFORE LUNCH AS DIRECTED    Dispense:  60 capsule    Refill:  5     [name], M.D.  [facility] [address] [place], [place] [address] [phone] phone [phone] fax  07/27/16 9:18 PM  Results for orders placed or performed in visit on 07/23/16  Comprehensive metabolic panel  Result Value Ref Range   Sodium 138 135 - 146 mmol/L   Potassium 3.7 3.5 - 5.3 mmol/L   Chloride 102 98 - 110 mmol/L   CO2 24 20 - 31 mmol/L   Glucose, Bld 120 (H) 65 - 99 mg/dL   BUN 15 7 - 25 mg/dL   Creat 8.46 9.62 - 9.52 mg/dL   Total Bilirubin 0.6 0.2 - 1.2 mg/dL  Alkaline Phosphatase 90 33 - 130 U/L   AST 15 10 - 35 U/L   ALT 15 6 - 29 U/L   Total Protein 7.4 6.1 - 8.1 g/dL   Albumin 4.1 3.6 - 5.1 g/dL   Calcium 9.6 8.6 - 16.1 mg/dL  Microalbumin/Creatinine Ratio, Urine  Result Value Ref Range   Creatinine, Urine 234 20 - 320 mg/dL   Microalb, Ur 1.7 Not estab mg/dL   Microalb Creat Ratio 7 <30 mcg/mg creat  POCT glycosylated hemoglobin (Hb A1C)  Result Value Ref Range   Hemoglobin A1C 6.1

## 2016-07-23 NOTE — Patient Instructions (Signed)
     IF you received an x-ray today, you will receive an invoice from [facility]. Please contact [facility] at [phone] with questions or concerns regarding your invoice.   IF you received labwork today, you will receive an invoice from [facility]. Please contact [facility] at [phone] with questions or concerns regarding your invoice.   Our billing staff will not be able to assist you with questions regarding bills from these companies.  You will be contacted with the lab results as soon as they are available. The fastest way to get your results is to activate your My Chart account. Instructions are located on the last page of this paperwork. If you have not heard from us regarding the results in 2 weeks, please contact this office.      

## 2016-07-24 LAB — MICROALBUMIN / CREATININE URINE RATIO
Creatinine, Urine: 234 mg/dL (ref 20–320)
MICROALB UR: 1.7 mg/dL
MICROALB/CREAT RATIO: 7 ug/mg{creat} (ref <30)

## 2016-08-04 ENCOUNTER — Other Ambulatory Visit: Payer: Self-pay | Admitting: Family Medicine

## 2016-08-23 ENCOUNTER — Other Ambulatory Visit: Payer: Self-pay | Admitting: Family Medicine

## 2016-09-16 ENCOUNTER — Telehealth: Payer: Self-pay

## 2016-09-16 NOTE — Telephone Encounter (Signed)
Pt is calling in regards to what type of medication you can take for sinuses with her blood pressure medicine   Please advise  [phone]

## 2016-09-17 NOTE — Telephone Encounter (Signed)
Pt advised to avoid decongestants, ok to use antihistamine or nasal saline

## 2016-11-25 ENCOUNTER — Other Ambulatory Visit: Payer: Self-pay | Admitting: Family Medicine

## 2016-12-07 ENCOUNTER — Ambulatory Visit: Payer: BC Managed Care – PPO

## 2016-12-10 ENCOUNTER — Ambulatory Visit (INDEPENDENT_AMBULATORY_CARE_PROVIDER_SITE_OTHER): Payer: BC Managed Care – PPO

## 2016-12-10 ENCOUNTER — Ambulatory Visit (INDEPENDENT_AMBULATORY_CARE_PROVIDER_SITE_OTHER): Payer: BC Managed Care – PPO | Admitting: Emergency Medicine

## 2016-12-10 VITALS — BP 144/80 | HR 92 | Temp 98.5°F | Resp 18 | Ht 65.0 in | Wt 222.0 lb

## 2016-12-10 DIAGNOSIS — M546 Pain in thoracic spine: Secondary | ICD-10-CM

## 2016-12-10 DIAGNOSIS — M542 Cervicalgia: Secondary | ICD-10-CM | POA: Diagnosis not present

## 2016-12-10 DIAGNOSIS — S161XXA Strain of muscle, fascia and tendon at neck level, initial encounter: Secondary | ICD-10-CM | POA: Diagnosis not present

## 2016-12-10 MED ORDER — HYDROCODONE-ACETAMINOPHEN 5-325 MG PO TABS: 1.0000 | ORAL_TABLET | Freq: Four times a day (QID) | ORAL | 0 refills | Status: DC | PRN
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
## 2016-12-10 NOTE — Patient Instructions (Addendum)
IF you received an x-ray today, you will receive an invoice from [facility]. Please contact [facility] at [phone] with questions or concerns regarding your invoice.   IF you received labwork today, you will receive an invoice from [facility]. Please contact [facility] at [phone] with questions or concerns regarding your invoice.   Our billing staff will not be able to assist you with questions regarding bills from these companies.  You will be contacted with the lab results as soon as they are available. The fastest way to get your results is to activate your My Chart account. Instructions are located on the last page of this paperwork. If you have not heard from us regarding the results in 2 weeks, please contact this office.     Thoracic Strain Thoracic strain is an injury to the muscles or tendons that attach to the upper back. A strain can be mild or severe. A mild strain may take only 1-2 weeks to heal. A severe strain involves torn muscles or tendons, so it may take 6-8 weeks to heal. Follow these instructions at home:  Rest as needed. Limit your activity as told by your doctor.  If directed, put ice on the injured area:  Put ice in a plastic bag.  Place a towel between your skin and the bag.  Leave the ice on for 20 minutes, 2-3 times per day.  Take over-the-counter and prescription medicines only as told by your doctor.  Begin doing exercises as told by your doctor or physical therapist.  Warm up before being active.  Bend your knees before you lift heavy objects.  Keep all follow-up visits as told by your doctor. This is important. Contact a doctor if:  Your pain is not helped by medicine.  Your pain, bruising, or swelling is getting worse.  You have a fever. Get help right away if:  You have shortness of breath.  You have chest pain.  You have weakness or loss of feeling (numbness) in your legs.  You cannot control when you  pee (urinate). This information is not intended to replace advice given to you by your health care provider. Make sure you discuss any questions you have with your health care provider. Document Released: 03/02/2008 Document Revised: 05/16/2016 Document Reviewed: 11/08/2014 Elsevier Interactive Patient Education  2017 [employer]  Cervical Sprain A cervical sprain is a stretch or tear in one or more of the tough, cord-like tissues that connect bones (ligaments) in the neck. Cervical sprains can range from mild to severe. Severe cervical sprains can cause the spinal bones (vertebrae) in the neck to be unstable. This can lead to spinal cord damage and can result in serious nervous system problems. The amount of time that it takes for a cervical sprain to get better depends on the cause and extent of the injury. Most cervical sprains heal in 4-6 weeks. What are the causes? Cervical sprains may be caused by an injury (trauma), such as from a motor vehicle accident, a fall, or sudden forward and backward whipping movement of the head and neck (whiplash injury). Mild cervical sprains may be caused by wear and tear over time, such as from poor posture, sitting in a chair that does not provide support, or looking up or down for long periods of time. What increases the risk? The following factors may make you more likely to develop this condition:  Participating in activities that have a high risk of trauma to the neck. These  include contact sports, auto racing, gymnastics, and diving.  Taking risks when driving or riding in a motor vehicle, such as speeding.  Having osteoarthritis of the spine.  Having poor strength and flexibility of the neck.  A previous neck injury.  Having poor posture.  Spending a lot of time in certain positions that put stress on the neck, such as sitting at a computer for long periods of time. What are the signs or symptoms? Symptoms of this condition include:  Pain,  soreness, stiffness, tenderness, swelling, or a burning sensation in the front, back, or sides of the neck.  Sudden tightening of neck muscles that you cannot control (muscle spasms).  Pain in the shoulders or upper back.  Limited ability to move the neck.  Headache.  Dizziness.  Nausea.  Vomiting.  Weakness, numbness, or tingling in a hand or an arm. Symptoms may develop right away after injury, or they may develop over a few days. In some cases, symptoms may go away with treatment and return (recur) over time. How is this diagnosed? This condition may be diagnosed based on:  Your medical history.  Your symptoms.  Any recent injuries or known neck problems that you have, such as arthritis in the neck.  A physical exam.  Imaging tests, such as:  X-rays.  MRI.  CT scan. How is this treated? This condition is treated by resting and icing the injured area and doing physical therapy exercises. Depending on the severity of your condition, treatment may also include:  Keeping your neck in place (immobilized) for periods of time. This may be done using:  A cervical collar. This supports your chin and the back of your head.  A cervical traction device. This is a sling that holds up your head. This removes weight and pressure from your neck, and it may help to relieve pain.  Medicines that help to relieve pain and inflammation.  Medicines that help to relax your muscles (muscle relaxants).  Surgery. This is rare. Follow these instructions at home: If you have a cervical collar:   Wear it as told by your health care provider. Do not remove the collar unless instructed by your health care provider.  Ask your health care provider before you make any adjustments to your collar.  If you have long hair, keep it outside of the collar.  Ask your health care provider if you can remove the collar for cleaning and bathing. If you are allowed to remove the collar for cleaning or  bathing:  Follow instructions from your health care provider about how to remove the collar safely.  Clean the collar by wiping it with mild soap and water and drying it completely.  If your collar has removable pads, remove them every 1-2 days and wash them by hand with soap and water. Let them air-dry completely before you put them back in the collar.  Check your skin under the collar for irritation or sores. If you see any, tell your health care provider. Managing pain, stiffness, and swelling   If directed, use a cervical traction device as told by your health care provider.  If directed, apply heat to the affected area before you do your physical therapy or as often as told by your health care provider. Use the heat source that your health care provider recommends, such as a moist heat pack or a heating pad.  Place a towel between your skin and the heat source.  Leave the heat on for 20-30  minutes.  Remove the heat if your skin turns bright red. This is especially important if you are unable to feel pain, heat, or cold. You may have a greater risk of getting burned.  If directed, put ice on the affected area:  Put ice in a plastic bag.  Place a towel between your skin and the bag.  Leave the ice on for 20 minutes, 2-3 times a day. Activity   Do not drive while wearing a cervical collar. If you do not have a cervical collar, ask your health care provider if it is safe to drive while your neck heals.  Do not drive or use heavy machinery while taking prescription pain medicine or muscle relaxants, unless your health care provider approves.  Do not lift anything that is heavier than 10 lb (4.5 kg) until your health care provider tells you that it is safe.  Rest as directed by your health care provider. Avoid positions and activities that make your symptoms worse. Ask your health care provider what activities are safe for you.  If physical therapy was prescribed, do exercises as  told by your health care provider or physical therapist. General instructions   Take over-the-counter and prescription medicines only as told by your health care provider.  Do not use any products that contain nicotine or tobacco, such as cigarettes and e-cigarettes. These can delay healing. If you need help quitting, ask your health care provider.  Keep all follow-up visits as told by your health care provider or physical therapist. This is important. How is this prevented? To prevent a cervical sprain from happening again:  Use and maintain good posture. Make any needed adjustments to your workstation to help you use good posture.  Exercise regularly as directed by your health care provider or physical therapist.  Avoid risky activities that may cause a cervical sprain. Contact a health care provider if:  You have symptoms that get worse or do not get better after 2 weeks of treatment.  You have pain that gets worse or does not get better with medicine.  You develop new, unexplained symptoms.  You have sores or irritated skin on your neck from wearing your cervical collar. Get help right away if:  You have severe pain.  You develop numbness, tingling, or weakness in any part of your body.  You cannot move a part of your body (you have paralysis).  You have neck pain along with:  Severe dizziness.  Headache. Summary  A cervical sprain is a stretch or tear in one or more of the tough, cord-like tissues that connect bones (ligaments) in the neck.  Cervical sprains may be caused by an injury (trauma), such as from a motor vehicle accident, a fall, or sudden forward and backward whipping movement of the head and neck (whiplash injury).  Symptoms may develop right away after injury, or they may develop over a few days.  This condition is treated by resting and icing the injured area and doing physical therapy exercises. This information is not intended to replace advice  given to you by your health care provider. Make sure you discuss any questions you have with your health care provider. Document Released: 07/12/2007 Document Revised: 05/13/2016 Document Reviewed: 05/13/2016 Elsevier Interactive Patient Education  2017 [employer].

## 2016-12-21 ENCOUNTER — Ambulatory Visit (INDEPENDENT_AMBULATORY_CARE_PROVIDER_SITE_OTHER): Payer: BC Managed Care – PPO | Admitting: Family Medicine

## 2016-12-21 ENCOUNTER — Encounter: Payer: Self-pay | Admitting: Family Medicine

## 2016-12-21 VITALS — BP 148/89 | HR 89 | Temp 98.0°F | Resp 16 | Ht 65.0 in | Wt 222.0 lb

## 2016-12-21 DIAGNOSIS — M546 Pain in thoracic spine: Secondary | ICD-10-CM | POA: Diagnosis not present

## 2016-12-21 DIAGNOSIS — S161XXD Strain of muscle, fascia and tendon at neck level, subsequent encounter: Secondary | ICD-10-CM | POA: Diagnosis not present

## 2016-12-21 MED ORDER — DICLOFENAC SODIUM 75 MG PO TBEC: 75.0000 mg | DELAYED_RELEASE_TABLET | Freq: Two times a day (BID) | ORAL | 0 refills | Status: DC

## 2016-12-21 MED ORDER — CYCLOBENZAPRINE HCL 10 MG PO TABS: 10.0000 mg | ORAL_TABLET | Freq: Three times a day (TID) | ORAL | 0 refills | Status: DC
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
## 2016-12-21 NOTE — Patient Instructions (Addendum)
     IF you received an x-ray today, you will receive an invoice from [facility]. Please contact [facility] at [phone] with questions or concerns regarding your invoice.   IF you received labwork today, you will receive an invoice from [facility]. Please contact [facility] at [phone] with questions or concerns regarding your invoice.   Our billing staff will not be able to assist you with questions regarding bills from these companies.  You will be contacted with the lab results as soon as they are available. The fastest way to get your results is to activate your My Chart account. Instructions are located on the last page of this paperwork. If you have not heard from us regarding the results in 2 weeks, please contact this office.      Acute Torticollis Torticollis is a condition in which the muscles of the neck tighten (contract) abnormally, causing the neck to twist and the head to move into an unnatural position. Torticollis that develops suddenly is called acute torticollis. If torticollis becomes chronic and is left untreated, the face and neck can become deformed. CAUSES This condition may be caused by:  Sleeping in an awkward position (common).  Extending or twisting the neck muscles beyond their normal position.  Infection. In some cases, the cause may not be known. SYMPTOMS Symptoms of this condition include:  An unnatural position of the head.  Neck pain.  A limited ability to move the neck.  Twisting of the neck to one side. DIAGNOSIS This condition is diagnosed with a physical exam. You may also have imaging tests, such as an X-ray, CT scan, or MRI. TREATMENT Treatment for this condition involves trying to relax the neck muscles. It may include:  Medicines or shots.  Physical therapy.  Surgery. This may be done in severe cases. HOME CARE INSTRUCTIONS  Take medicines only as directed by your health care provider.  Do stretching  exercises and massage your neck as directed by your health care provider.  Keep all follow-up visits as directed by your health care provider. This is important. SEEK MEDICAL CARE IF:  You develop a fever. SEEK IMMEDIATE MEDICAL CARE IF:  You develop difficulty breathing.  You develop noisy breathing (stridor).  You start drooling.  You have trouble swallowing or have pain with swallowing.  You develop numbness or weakness in your hands or feet.  You have changes in your speech, understanding, or vision.  Your pain gets worse. This information is not intended to replace advice given to you by your health care provider. Make sure you discuss any questions you have with your health care provider. Document Released: 09/11/2000 Document Revised: 01/06/2016 Document Reviewed: 09/10/2014 Elsevier Interactive Patient Education  2017 Elsevier Inc.  

## 2017-01-09 ENCOUNTER — Ambulatory Visit (INDEPENDENT_AMBULATORY_CARE_PROVIDER_SITE_OTHER): Payer: BC Managed Care – PPO | Admitting: Family Medicine

## 2017-01-09 VITALS — BP 147/83 | HR 82 | Temp 97.5°F | Resp 18 | Ht 65.0 in | Wt 219.2 lb

## 2017-01-09 DIAGNOSIS — S46911A Strain of unspecified muscle, fascia and tendon at shoulder and upper arm level, right arm, initial encounter: Secondary | ICD-10-CM | POA: Diagnosis not present

## 2017-01-09 DIAGNOSIS — S161XXD Strain of muscle, fascia and tendon at neck level, subsequent encounter: Secondary | ICD-10-CM | POA: Diagnosis not present

## 2017-01-09 DIAGNOSIS — M546 Pain in thoracic spine: Secondary | ICD-10-CM | POA: Diagnosis not present

## 2017-01-09 MED ORDER — DIAZEPAM 5 MG PO TABS: 5.0000 mg | ORAL_TABLET | Freq: Every evening | ORAL | 0 refills | Status: DC | PRN
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
## 2017-01-09 NOTE — Patient Instructions (Addendum)
  Increase your amlodipine to 2 tabs a day until we recheck later this month.  Continue the diclofenac twice a day. If you want to continue the cyclobenzaprine as needed during the day as well that's fine. Perhaps taking one after work would be the best regimen. Start physical therapy as soon as they call you.    IF you received an x-ray today, you will receive an invoice from [facility]. Please contact [facility] at [phone] with questions or concerns regarding your invoice.   IF you received labwork today, you will receive an invoice from [place]. Please contact [facility] at [phone] with questions or concerns regarding your invoice.   Our billing staff will not be able to assist you with questions regarding bills from these companies.  You will be contacted with the lab results as soon as they are available. The fastest way to get your results is to activate your My Chart account. Instructions are located on the last page of this paperwork. If you have not heard from [place] regarding the results in 2 weeks, please contact this office.      Acute Torticollis Torticollis is a condition in which the muscles of the neck tighten (contract) abnormally, causing the neck to twist and the head to move into an unnatural position. Torticollis that develops suddenly is called acute torticollis. If torticollis becomes chronic and is left untreated, the face and neck can become deformed. CAUSES This condition may be caused by:  Sleeping in an awkward position (common).  Extending or twisting the neck muscles beyond their normal position.  Infection. In some cases, the cause may not be known. SYMPTOMS Symptoms of this condition include:  An unnatural position of the head.  Neck pain.  A limited ability to move the neck.  Twisting of the neck to one side. DIAGNOSIS This condition is diagnosed with a physical exam. You may also have imaging tests, such as an X-ray,  CT scan, or MRI. TREATMENT Treatment for this condition involves trying to relax the neck muscles. It may include:  Medicines or shots.  Physical therapy.  Surgery. This may be done in severe cases. HOME CARE INSTRUCTIONS  Take medicines only as directed by your health care provider.  Do stretching exercises and massage your neck as directed by your health care provider.  Keep all follow-up visits as directed by your health care provider. This is important. SEEK MEDICAL CARE IF:  You develop a fever. SEEK IMMEDIATE MEDICAL CARE IF:  You develop difficulty breathing.  You develop noisy breathing (stridor).  You start drooling.  You have trouble swallowing or have pain with swallowing.  You develop numbness or weakness in your hands or feet.  You have changes in your speech, understanding, or vision.  Your pain gets worse. This information is not intended to replace advice given to you by your health care provider. Make sure you discuss any questions you have with your health care provider. Document Released: 09/11/2000 Document Revised: 01/06/2016 Document Reviewed: 09/10/2014 Elsevier Interactive Patient Education  2017 [employer].

## 2017-01-19 ENCOUNTER — Ambulatory Visit (INDEPENDENT_AMBULATORY_CARE_PROVIDER_SITE_OTHER): Payer: BC Managed Care – PPO | Admitting: Orthopaedic Surgery

## 2017-01-19 ENCOUNTER — Encounter (INDEPENDENT_AMBULATORY_CARE_PROVIDER_SITE_OTHER): Payer: Self-pay | Admitting: Orthopaedic Surgery

## 2017-01-19 VITALS — BP 134/89 | HR 90 | Ht 65.0 in | Wt 215.0 lb

## 2017-01-19 DIAGNOSIS — M546 Pain in thoracic spine: Secondary | ICD-10-CM | POA: Diagnosis not present

## 2017-01-19 DIAGNOSIS — S161XXA Strain of muscle, fascia and tendon at neck level, initial encounter: Secondary | ICD-10-CM | POA: Diagnosis not present
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
## 2017-01-21 ENCOUNTER — Encounter: Payer: Self-pay | Admitting: Rehabilitation

## 2017-01-21 ENCOUNTER — Ambulatory Visit (INDEPENDENT_AMBULATORY_CARE_PROVIDER_SITE_OTHER): Payer: BC Managed Care – PPO | Admitting: Family Medicine

## 2017-01-21 ENCOUNTER — Ambulatory Visit: Payer: BC Managed Care – PPO | Attending: Family Medicine | Admitting: Rehabilitation

## 2017-01-21 ENCOUNTER — Encounter: Payer: Self-pay | Admitting: Family Medicine

## 2017-01-21 VITALS — BP 122/80 | HR 94 | Temp 97.9°F | Resp 18 | Ht 65.0 in | Wt 218.2 lb

## 2017-01-21 DIAGNOSIS — M545 Low back pain, unspecified: Secondary | ICD-10-CM

## 2017-01-21 DIAGNOSIS — R7303 Prediabetes: Secondary | ICD-10-CM

## 2017-01-21 DIAGNOSIS — M546 Pain in thoracic spine: Secondary | ICD-10-CM

## 2017-01-21 DIAGNOSIS — Z130 Encounter for screening for diseases of the blood and blood-forming organs and certain disorders involving the immune mechanism: Secondary | ICD-10-CM | POA: Diagnosis not present

## 2017-01-21 DIAGNOSIS — M542 Cervicalgia: Secondary | ICD-10-CM | POA: Diagnosis not present

## 2017-01-21 DIAGNOSIS — Z136 Encounter for screening for cardiovascular disorders: Secondary | ICD-10-CM | POA: Diagnosis not present

## 2017-01-21 DIAGNOSIS — Z1389 Encounter for screening for other disorder: Secondary | ICD-10-CM

## 2017-01-21 DIAGNOSIS — Z0000 Encounter for general adult medical examination without abnormal findings: Secondary | ICD-10-CM | POA: Diagnosis not present

## 2017-01-21 DIAGNOSIS — Z1231 Encounter for screening mammogram for malignant neoplasm of breast: Secondary | ICD-10-CM

## 2017-01-21 DIAGNOSIS — Z6835 Body mass index (BMI) 35.0-35.9, adult: Secondary | ICD-10-CM

## 2017-01-21 DIAGNOSIS — I10 Essential (primary) hypertension: Secondary | ICD-10-CM | POA: Diagnosis not present

## 2017-01-21 DIAGNOSIS — Z1329 Encounter for screening for other suspected endocrine disorder: Secondary | ICD-10-CM | POA: Diagnosis not present

## 2017-01-21 DIAGNOSIS — Z5181 Encounter for therapeutic drug level monitoring: Secondary | ICD-10-CM

## 2017-01-21 DIAGNOSIS — Z1383 Encounter for screening for respiratory disorder NEC: Secondary | ICD-10-CM

## 2017-01-21 LAB — POCT URINALYSIS DIP (MANUAL ENTRY)
Bilirubin, UA: NEGATIVE
GLUCOSE UA: NEGATIVE mg/dL
Ketones, POC UA: NEGATIVE mg/dL
LEUKOCYTES UA: NEGATIVE
Nitrite, UA: NEGATIVE
PH UA: 7 (ref 5.0–8.0)
Protein Ur, POC: NEGATIVE mg/dL
Spec Grav, UA: 1.015 (ref 1.010–1.025)
UROBILINOGEN UA: 0.2 U/dL

## 2017-01-21 MED ORDER — PHENTERMINE HCL 15 MG PO CAPS: ORAL_CAPSULE | ORAL | 5 refills | Status: DC

## 2017-01-21 MED ORDER — RANITIDINE HCL 150 MG PO TABS: ORAL_TABLET | ORAL | 3 refills | Status: DC

## 2017-01-21 NOTE — Progress Notes (Signed)
Subjective:    Patient ID: Katherine [name], female    DOB: 1956-06-27, 61 y.o.   MRN: [id number]  HPI  Katherine [name] is a delightful 61 year old woman here for her complete physical. Her last was done November 2016.  Obesity, H/o DM: She has been working very hard on diet/exercise since she was diagnosed with DM 03/2015. She made remarkable lifestyle changes with exercise and diet. Initially started on metformin but no significant weight loss from that so added in phentermine and since had seen study consistent weight loss. No side effects. Sleeping well.  Lab Results  Component Value Date   HGBA1C 6.1 07/23/2016   HGBA1C 6.3 01/23/2016   HGBA1C 6.3 08/08/2015     HTN: Has bp cuff at home.  She is taking the diclofenac, flexeril, and valium at night. Ok to call if neeedsr fills She is out of her acid refluex med   Primary Preventative Screenings: Cervical Cancer: due for pap 12/2017. Pt has a distant hx of abnormal abnormal papsmear but was normal when last done by myself at 12/2012 and was normal with neg high risk HPV so does not need to be repeated until 2019.  STI screening: Had neg hep C 12/2012; neg HIV 03/2015 Breast Cancer: done 02/21/16- normal, repeat in 1 year Colorectal Cancer: 2009 and repeated June 2014 by Dr. Loreta Ave. Result are not present in epic charts.  Tobacco use: Bone Density: Cardiac: Weight/blood sugar: Still working hard on diet and exercise. OTC/vit/supp/herbal: nml vit D 4 yrs prior.  She takes a daily mvi (centrum) which has a little higher D level. She eats certeal in the morning.  Diet/Exercise/EtOH/substances: Dentist/Optho: done at [facility] 11/2015 Immunizations: Shingles vaccine - 2013.  Immunization History  Administered Date(s) Administered  . Influenza Split 07/08/2015  . Influenza,inj,Quad PF,36+ Mos 06/23/2014, 07/23/2016  . Pneumococcal Polysaccharide-23 08/08/2015  . Tdap 06/15/2011   Retired Runner, broadcasting/film/video  Past Medical History:  Diagnosis Date    . Hypertension    Past Surgical History:  Procedure Laterality Date  . CHOLECYSTECTOMY    . rotator cuff surgery     Current Outpatient Prescriptions on File Prior to Visit  Medication Sig Dispense Refill  . amLODipine (NORVASC) 5 MG tablet Take 1 tablet (5 mg total) by mouth daily. 90 tablet 3  . aspirin EC 81 MG tablet Take 81 mg by mouth daily.    . Biotin 1000 MCG tablet Take 1,000 mcg by mouth 3 (three) times daily.    . cyclobenzaprine (FLEXERIL) 10 MG tablet Take 1 tablet (10 mg total) by mouth 3 (three) times daily. 60 tablet 0  . diazepam (VALIUM) 5 MG tablet Take 1 tablet (5 mg total) by mouth at bedtime and may repeat dose one time if needed. 30 tablet 0  . diclofenac (VOLTAREN) 75 MG EC tablet Take 1 tablet (75 mg total) by mouth 2 (two) times daily. 60 tablet 0  . metFORMIN (GLUCOPHAGE) 500 MG tablet Take 1 tablet (500 mg total) by mouth 2 (two) times daily with a meal. 180 tablet 3  . Multiple Vitamin (MULTIVITAMIN) tablet Take 1 tablet by mouth daily.    Marland Kitchen pyridOXINE (VITAMIN B-6) 100 MG tablet Take 100 mg by mouth daily.    . vitamin B-12 (CYANOCOBALAMIN) 1000 MCG tablet Take 1,000 mcg by mouth daily.     No current facility-administered medications on file prior to visit.    Allergies  Allergen Reactions  . Ace Inhibitors Cough  . Adhesive [Tape] Rash  Family History  Problem Relation Age of Onset  . Diabetes Father   . Hypertension Father   . Sickle cell anemia Brother   . Cancer Brother 15       Leukemia, passed away at 91  . Cancer Maternal Grandmother        breast cancer   Social History   Social History  . Marital status: Married    Spouse name: N/A  . Number of children: N/A  . Years of education: Masters    Occupational History  . Teacher    Social History Main Topics  . Smoking status: Never Smoker  . Smokeless tobacco: Never Used  . Alcohol use 0.0 oz/week     Comment: social  . Drug use: No  . Sexual activity: Yes   Other Topics  Concern  . None   Social History Narrative   Patient has had 2 pregnancies and has 2 living children.   Depression screen [facility] 2/9 01/21/2017 01/09/2017 12/21/2016 12/10/2016 07/23/2016  Decreased Interest 0 0 0 0 0  Down, Depressed, Hopeless 0 0 0 0 0  PHQ - 2 Score 0 0 0 0 0    Review of Systems See hpi    Objective:   Physical Exam  Constitutional: She is oriented to person, place, and time. She appears well-developed and well-nourished. No distress.  HENT:  Head: Normocephalic and atraumatic.  Right Ear: Tympanic membrane, external ear and ear canal normal.  Left Ear: Tympanic membrane, external ear and ear canal normal.  Nose: Nose normal. No mucosal edema or rhinorrhea.  Mouth/Throat: Uvula is midline, oropharynx is clear and moist and mucous membranes are normal. No posterior oropharyngeal erythema.  Eyes: Conjunctivae and EOM are normal. Pupils are equal, round, and reactive to light. Right eye exhibits no discharge. Left eye exhibits no discharge. No scleral icterus.  Neck: Normal range of motion. Neck supple. No thyromegaly present.  Cardiovascular: Normal rate, regular rhythm, normal heart sounds and intact distal pulses.   Pulmonary/Chest: Effort normal and breath sounds normal. No respiratory distress.  Abdominal: Soft. Bowel sounds are normal. There is no tenderness.  Genitourinary: No breast swelling, tenderness, discharge or bleeding.  Musculoskeletal: She exhibits no edema.  Lymphadenopathy:    She has no cervical adenopathy.  Neurological: She is alert and oriented to person, place, and time. She has normal reflexes.  Skin: Skin is warm and dry. She is not diaphoretic. No erythema.  Psychiatric: She has a normal mood and affect. Her behavior is normal.   bruise on left medial calf of unknown etiology  BP 122/80   Pulse 94   Temp 97.9 F (36.6 C) (Oral)   Resp 18   Ht  (1.651 m)   Wt 218 lb 3.2 oz (99 kg)   SpO2 100%   BMI 36.31 kg/m      Assessment  & Plan:  a1c, ua, lipid, cmp, cbc, tsh Needs foot exam Due for pap next yr  Goal <200 lbs by end of summer of pt and so encouraged erh to be <210 by July  1. Annual physical exam   2. Screening for cardiovascular, respiratory, and genitourinary diseases   3. Screening for deficiency anemia   4. Screening for thyroid disorder   5. Encounter for screening mammogram for breast cancer   6. Class 2 severe obesity due to excess calories with serious comorbidity and body mass index (BMI) of 35.0 to 35.9 in adult (HCC)   7. Essential hypertension  8. Prediabetes   9. Neck pain   10. Acute right-sided thoracic back pain   11. Medication monitoring encounter     Orders Placed This Encounter  Procedures  . Hemoglobin A1c  . Comprehensive metabolic panel    Order Specific Question:   Has the patient fasted?    Answer:   Yes  . Lipid panel    Order Specific Question:   Has the patient fasted?    Answer:   Yes  . TSH  . CBC  . Care order/instruction:    Scheduling Instructions:     Complete orders, AVS and go.  Marland Kitchen POCT urinalysis dipstick    Meds ordered this encounter  Medications  . phentermine 15 MG capsule    Sig: TAKE ONE CAPSULE BY MOUTH EVERY MORNING AND TAKE 1 CAPSULE BEFORE LUNCH AS DIRECTED    Dispense:  60 capsule    Refill:  5  . ranitidine (ZANTAC) 150 MG tablet    Sig: TAKE ONE TABLET BY MOUTH TWICE DAILY AS NEEDED FOR  HEARTBURN    Dispense:  180 tablet    Refill:  3    Please consider 90 day supplies to promote better adherence      [name], M.D.  Primary Care at [facility] [address] [place], [place] [address] [phone] phone [phone] fax  02/09/17 3:23 AM

## 2017-01-21 NOTE — Patient Instructions (Addendum)
IF you received an x-ray today, you will receive an invoice from [facility]. Please contact [facility] at [phone] with questions or concerns regarding your invoice.   IF you received labwork today, you will receive an invoice from [facility]. Please contact [facility] at [phone] with questions or concerns regarding your invoice.   Our billing staff will not be able to assist you with questions regarding bills from these companies.  You will be contacted with the lab results as soon as they are available. The fastest way to get your results is to activate your My Chart account. Instructions are located on the last page of this paperwork. If you have not heard from [place] regarding the results in 2 weeks, please contact this office.      Calcium Intake Recommendations Calcium is a mineral that affects many functions in the body, including:  Blood clotting.  Blood vessel function.  Nerve impulse conduction.  Hormone secretion.  Muscle contraction.  Bone and teeth functions. Most of your body's calcium supply is stored in your bones and teeth. When your calcium stores are low, you may be at risk for low bone mass, bone loss, and bone fractures. Consuming enough calcium helps to grow healthy bones and teeth and to prevent breakdown over time. It is very important that you get enough calcium if you are:  A child undergoing rapid growth.  An adolescent girl.  A pre- or post-menopausal woman.  A woman whose menstrual cycle has stopped due to anorexia nervosa or regular intense exercise.  An individual with lactose intolerance or a milk allergy.  A vegetarian. What is my plan? Try to consume the recommended amount of calcium daily based on your age. Depending on your overall health, your health care provider may recommend increased calcium intake.General daily calcium intake recommendations by age are:  Birth to 6 months: 200 mg.  Infants 7 to 12  months: 260 mg.  Children 1 to 3 years: 700 mg.  Children 4 to 8 years: 1,000 mg.  Children 9 to 13 years: 1,300 mg.  Teens 14 to 18 years: 1,300 mg.  Adults 19 to 50 years: 1,000 mg.  Adult women 51 to 70 years: 1,200 mg.  Adult men 51 to 70 years: 1,000 mg.  Adults 71 years and older: 1,200 mg.  Pregnant and breastfeeding teens: 1,300 mg.  Pregnant and breastfeeding adults: 1,000 mg. What do I need to know about calcium intake?  In order for the body to absorb calcium, it needs vitamin D. You can get vitamin D through:  Direct exposure of the skin to sunlight.  Foods, such as egg yolks, liver, saltwater fish, and fortified milk.  Supplements.  Consuming too much calcium may cause:  Constipation.  Decreased absorption of iron and zinc.  Kidney stones.  Calcium supplements may interact with certain medicines. Check with your health care provider before starting any calcium supplements.  Try to get most of your calcium from food. What foods can I eat? Grains   Fortified oatmeal. Fortified ready-to-eat cereals. Fortified frozen waffles. Vegetables  Turnip greens. Broccoli. Fruits  Fortified orange juice. Meats and Other Protein Sources  Canned sardines with bones. Canned salmon with bones. Soy beans. Tofu. Baked beans. Almonds. [place] nuts. Sunflower seeds. Dairy  Milk. Yogurt. Cheese. Cottage cheese. Beverages  Fortified soy milk. Fortified rice milk. Sweets/Desserts  Pudding. Ice Cream. Milkshakes. Blackstrap molasses. The items listed above may not be a complete list of recommended foods or beverages. Contact  your dietitian for more options.  What foods can affect my calcium intake? It may be more difficult for your body to use calcium or calcium may leave your body more quickly if you consume large amounts of:  Sodium.  Protein.  Caffeine.  Alcohol. This information is not intended to replace advice given to you by your health care provider. Make  sure you discuss any questions you have with your health care provider. Document Released: 04/28/2004 Document Revised: 04/03/2016 Document Reviewed: 02/20/2014 Elsevier Interactive Patient Education  2017 Elsevier Inc.  Bone Health Bones protect organs, store calcium, and anchor muscles. Good health habits, such as eating nutritious foods and exercising regularly, are important for maintaining healthy bones. They can also help to prevent a condition that causes bones to lose density and become weak and brittle (osteoporosis). Why is bone mass important? Bone mass refers to the amount of bone tissue that you have. The higher your bone mass, the stronger your bones. An important step toward having healthy bones throughout life is to have strong and dense bones during childhood. A young adult who has a high bone mass is more likely to have a high bone mass later in life. Bone mass at its greatest it is called peak bone mass. A large decline in bone mass occurs in older adults. In women, it occurs about the time of menopause. During this time, it is important to practice good health habits, because if more bone is lost than what is replaced, the bones will become less healthy and more likely to break (fracture). If you find that you have a low bone mass, you may be able to prevent osteoporosis or further bone loss by changing your diet and lifestyle. How can I find out if my bone mass is low? Bone mass can be measured with an X-ray test that is called a bone mineral density (BMD) test. This test is recommended for all women who are age 5 or older. It may also be recommended for men who are age 57 or older, or for people who are more likely to develop osteoporosis due to:  Having bones that break easily.  Having a long-term disease that weakens bones, such as kidney disease or rheumatoid arthritis.  Having menopause earlier than normal.  Taking medicine that weakens bones, such as steroids, thyroid  hormones, or hormone treatment for breast cancer or prostate cancer.  Smoking.  Drinking three or more alcoholic drinks each day. What are the nutritional recommendations for healthy bones? To have healthy bones, you need to get enough of the right minerals and vitamins. Most nutrition experts recommend getting these nutrients from the foods that you eat. Nutritional recommendations vary from person to person. Ask your health care provider what is healthy for you. Here are some general guidelines. Calcium Recommendations  Calcium is the most important (essential) mineral for bone health. Most people can get enough calcium from their diet, but supplements may be recommended for people who are at risk for osteoporosis. Good sources of calcium include:  Dairy products, such as low-fat or nonfat milk, cheese, and yogurt.  Dark green leafy vegetables, such as bok choy and broccoli.  Calcium-fortified foods, such as orange juice, cereal, bread, soy beverages, and tofu products.  Nuts, such as almonds. Follow these recommended amounts for daily calcium intake:  Children, age 57?3: 700 mg.  Children, age 28?8: 1,000 mg.  Children, age 21?13: 1,300 mg.  Teens, age 574?18: 1,300 mg.  Adults, age 579?50: 1,000 mg.  Adults, age 54?70:  Men: 1,000 mg.  Women: 1,200 mg.  Adults, age 3 or older: 1,200 mg.  Pregnant and breastfeeding females:  Teens: 1,300 mg.  Adults: 1,000 mg. Vitamin D Recommendations  Vitamin D is the most essential vitamin for bone health. It helps the body to absorb calcium. Sunlight stimulates the skin to make vitamin D, so be sure to get enough sunlight. If you live in a cold climate or you do not get outside often, your health care provider may recommend that you take vitamin D supplements. Good sources of vitamin D in your diet include:  Egg yolks.  Saltwater fish.  Milk and cereal fortified with vitamin D. Follow these recommended amounts for daily vitamin D  intake:  Children and teens, age 65?18: 600 international units.  Adults, age 32 or younger: 400-800 international units.  Adults, age 657 or older: 800-1,000 international units. Other Nutrients  Other nutrients for bone health include:  Phosphorus. This mineral is found in meat, poultry, dairy foods, nuts, and legumes. The recommended daily intake for adult men and adult women is 700 mg.  Magnesium. This mineral is found in seeds, nuts, dark green vegetables, and legumes. The recommended daily intake for adult men is 400?420 mg. For adult women, it is 310?320 mg.  Vitamin K. This vitamin is found in green leafy vegetables. The recommended daily intake is 120 mg for adult men and 90 mg for adult women. What type of physical activity is best for building and maintaining healthy bones? Weight-bearing and strength-building activities are important for building and maintaining peak bone mass. Weight-bearing activities cause muscles and bones to work against gravity. Strength-building activities increases muscle strength that supports bones. Weight-bearing and muscle-building activities include:  Walking and hiking.  Jogging and running.  Dancing.  Gym exercises.  Lifting weights.  Tennis and racquetball.  Climbing stairs.  Aerobics. Adults should get at least 30 minutes of moderate physical activity on most days. Children should get at least 60 minutes of moderate physical activity on most days. Ask your health care provide what type of exercise is best for you. Where can I find more information? For more information, check out the following websites:  National Osteoporosis Foundation: http://burton-owens.org/  [employer]: http://www.niams.http://www.johnson-fowler.biz/.asp This information is not intended to replace advice given to you by your health care provider. Make sure you discuss any questions you have with your health care  provider. Document Released: 12/05/2003 Document Revised: 04/03/2016 Document Reviewed: 09/19/2014 Elsevier Interactive Patient Education  2017 [employer].
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
## 2017-01-22 LAB — CBC
Hematocrit: 40.7 % (ref 34.0–46.6)
Hemoglobin: 14.3 g/dL (ref 11.1–15.9)
MCH: 28.4 pg (ref 26.6–33.0)
MCHC: 35.1 g/dL (ref 31.5–35.7)
MCV: 81 fL (ref 79–97)
PLATELETS: 270 10*3/uL (ref 150–379)
RBC: 5.04 x10E6/uL (ref 3.77–5.28)
RDW: 15.8 % — AB (ref 12.3–15.4)
WBC: 5.3 10*3/uL (ref 3.4–10.8)

## 2017-01-22 LAB — COMPREHENSIVE METABOLIC PANEL
ALBUMIN: 4.2 g/dL (ref 3.6–4.8)
ALK PHOS: 101 IU/L (ref 39–117)
ALT: 23 IU/L (ref 0–32)
AST: 22 IU/L (ref 0–40)
Albumin/Globulin Ratio: 1.2 (ref 1.2–2.2)
BUN/Creatinine Ratio: 22 (ref 12–28)
BUN: 19 mg/dL (ref 8–27)
Bilirubin Total: 0.4 mg/dL (ref 0.0–1.2)
CO2: 25 mmol/L (ref 18–29)
CREATININE: 0.86 mg/dL (ref 0.57–1.00)
Calcium: 9.4 mg/dL (ref 8.7–10.3)
Chloride: 102 mmol/L (ref 96–106)
GFR calc Af Amer: 84 mL/min/{1.73_m2} (ref >59)
GFR calc non Af Amer: 73 mL/min/{1.73_m2} (ref >59)
GLUCOSE: 100 mg/dL — AB (ref 65–99)
Globulin, Total: 3.4 g/dL (ref 1.5–4.5)
Potassium: 4.4 mmol/L (ref 3.5–5.2)
Sodium: 141 mmol/L (ref 134–144)
TOTAL PROTEIN: 7.6 g/dL (ref 6.0–8.5)

## 2017-01-22 LAB — LIPID PANEL
Chol/HDL Ratio: 3.5 ratio (ref 0.0–4.4)
Cholesterol, Total: 176 mg/dL (ref 100–199)
HDL: 50 mg/dL (ref >39)
LDL CALC: 100 mg/dL — AB (ref 0–99)
Triglycerides: 132 mg/dL (ref 0–149)
VLDL CHOLESTEROL CAL: 26 mg/dL (ref 5–40)

## 2017-01-22 LAB — HEMOGLOBIN A1C
ESTIMATED AVERAGE GLUCOSE: 111 mg/dL
Hgb A1c MFr Bld: 5.5 % (ref 4.8–5.6)

## 2017-01-22 LAB — TSH: TSH: 2.26 u[IU]/mL (ref 0.450–4.500)

## 2017-01-25 ENCOUNTER — Ambulatory Visit: Payer: BC Managed Care – PPO | Admitting: Rehabilitation

## 2017-01-25 ENCOUNTER — Encounter: Payer: Self-pay | Admitting: Rehabilitation

## 2017-01-25 DIAGNOSIS — M542 Cervicalgia: Secondary | ICD-10-CM
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
## 2017-01-26 ENCOUNTER — Other Ambulatory Visit: Payer: Self-pay | Admitting: Family Medicine

## 2017-01-26 NOTE — Telephone Encounter (Signed)
Pt was given a paper rx for a 6 mo refill on this on 4/26 at her CPE.  Please ensure that she received it.

## 2017-01-29 ENCOUNTER — Encounter: Payer: Self-pay | Admitting: Physical Therapy

## 2017-01-29 ENCOUNTER — Ambulatory Visit: Payer: BC Managed Care – PPO | Attending: Family Medicine | Admitting: Physical Therapy

## 2017-01-29 DIAGNOSIS — M542 Cervicalgia: Secondary | ICD-10-CM | POA: Insufficient documentation

## 2017-01-29 DIAGNOSIS — M545 Low back pain, unspecified: Secondary | ICD-10-CM
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
## 2017-02-03 ENCOUNTER — Other Ambulatory Visit: Payer: Self-pay | Admitting: Family Medicine

## 2017-02-03 ENCOUNTER — Ambulatory Visit: Payer: BC Managed Care – PPO | Admitting: Rehabilitation

## 2017-02-03 DIAGNOSIS — M545 Low back pain, unspecified: Secondary | ICD-10-CM

## 2017-02-03 DIAGNOSIS — Z1231 Encounter for screening mammogram for malignant neoplasm of breast: Secondary | ICD-10-CM

## 2017-02-03 DIAGNOSIS — M542 Cervicalgia: Secondary | ICD-10-CM
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
## 2017-02-05 ENCOUNTER — Ambulatory Visit: Payer: BC Managed Care – PPO | Admitting: Physical Therapy

## 2017-02-05 ENCOUNTER — Encounter: Payer: Self-pay | Admitting: Physical Therapy

## 2017-02-05 DIAGNOSIS — M545 Low back pain, unspecified: Secondary | ICD-10-CM

## 2017-02-05 DIAGNOSIS — M542 Cervicalgia: Secondary | ICD-10-CM | POA: Diagnosis not present
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
## 2017-02-09 ENCOUNTER — Ambulatory Visit: Payer: BC Managed Care – PPO | Admitting: Physical Therapy

## 2017-02-09 DIAGNOSIS — M542 Cervicalgia: Secondary | ICD-10-CM | POA: Diagnosis not present

## 2017-02-09 DIAGNOSIS — M545 Low back pain, unspecified: Secondary | ICD-10-CM
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
## 2017-02-11 ENCOUNTER — Encounter: Payer: Self-pay | Admitting: Physical Therapy

## 2017-02-11 ENCOUNTER — Ambulatory Visit: Payer: BC Managed Care – PPO | Admitting: Physical Therapy

## 2017-02-11 DIAGNOSIS — M542 Cervicalgia: Secondary | ICD-10-CM | POA: Diagnosis not present

## 2017-02-11 DIAGNOSIS — M545 Low back pain, unspecified: Secondary | ICD-10-CM
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
## 2017-02-16 ENCOUNTER — Encounter: Payer: Self-pay | Admitting: Physical Therapy

## 2017-02-16 ENCOUNTER — Ambulatory Visit: Payer: BC Managed Care – PPO | Admitting: Physical Therapy

## 2017-02-16 DIAGNOSIS — M542 Cervicalgia: Secondary | ICD-10-CM

## 2017-02-16 DIAGNOSIS — M545 Low back pain, unspecified: Secondary | ICD-10-CM
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
## 2017-02-18 ENCOUNTER — Ambulatory Visit: Payer: BC Managed Care – PPO | Admitting: Physical Therapy

## 2017-02-18 ENCOUNTER — Encounter: Payer: Self-pay | Admitting: Physical Therapy

## 2017-02-18 DIAGNOSIS — M542 Cervicalgia: Secondary | ICD-10-CM

## 2017-02-18 DIAGNOSIS — M545 Low back pain, unspecified: Secondary | ICD-10-CM
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
## 2017-02-19 ENCOUNTER — Ambulatory Visit
Admission: RE | Admit: 2017-02-19 | Discharge: 2017-02-19 | Disposition: A | Discharge status: Home / Self Care | Payer: BC Managed Care – PPO | Source: Ambulatory Visit | Attending: Family Medicine | Admitting: Family Medicine

## 2017-02-19 DIAGNOSIS — Z1231 Encounter for screening mammogram for malignant neoplasm of breast: Secondary | ICD-10-CM

## 2017-02-23 ENCOUNTER — Ambulatory Visit: Payer: BC Managed Care – PPO | Admitting: Physical Therapy

## 2017-02-23 ENCOUNTER — Encounter: Payer: Self-pay | Admitting: Physical Therapy

## 2017-02-23 DIAGNOSIS — M542 Cervicalgia: Secondary | ICD-10-CM | POA: Diagnosis not present

## 2017-02-23 DIAGNOSIS — M545 Low back pain, unspecified: Secondary | ICD-10-CM
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
## 2017-02-25 ENCOUNTER — Encounter: Payer: Self-pay | Admitting: Physical Therapy

## 2017-02-25 ENCOUNTER — Ambulatory Visit: Payer: BC Managed Care – PPO | Admitting: Physical Therapy

## 2017-02-25 DIAGNOSIS — M545 Low back pain, unspecified: Secondary | ICD-10-CM

## 2017-02-25 DIAGNOSIS — M542 Cervicalgia: Secondary | ICD-10-CM | POA: Diagnosis not present
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
## 2017-03-02 ENCOUNTER — Ambulatory Visit (INDEPENDENT_AMBULATORY_CARE_PROVIDER_SITE_OTHER): Payer: BC Managed Care – PPO | Admitting: Orthopaedic Surgery

## 2017-03-02 ENCOUNTER — Encounter: Payer: Self-pay | Admitting: Physical Therapy

## 2017-03-02 ENCOUNTER — Ambulatory Visit: Payer: BC Managed Care – PPO | Attending: Family Medicine | Admitting: Physical Therapy

## 2017-03-02 ENCOUNTER — Encounter (INDEPENDENT_AMBULATORY_CARE_PROVIDER_SITE_OTHER): Payer: Self-pay | Admitting: Orthopaedic Surgery

## 2017-03-02 DIAGNOSIS — M542 Cervicalgia: Secondary | ICD-10-CM | POA: Diagnosis present

## 2017-03-02 DIAGNOSIS — M545 Low back pain, unspecified: Secondary | ICD-10-CM
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
## 2017-03-04 ENCOUNTER — Ambulatory Visit: Payer: BC Managed Care – PPO | Admitting: Physical Therapy

## 2017-03-04 ENCOUNTER — Encounter: Payer: Self-pay | Admitting: Physical Therapy

## 2017-03-04 DIAGNOSIS — M545 Low back pain, unspecified: Secondary | ICD-10-CM

## 2017-03-04 DIAGNOSIS — M542 Cervicalgia: Secondary | ICD-10-CM
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
## 2017-03-09 ENCOUNTER — Ambulatory Visit: Payer: BC Managed Care – PPO | Admitting: Physical Therapy

## 2017-03-11 ENCOUNTER — Ambulatory Visit: Payer: BC Managed Care – PPO | Admitting: Physical Therapy

## 2017-03-16 ENCOUNTER — Encounter: Payer: Self-pay | Admitting: Physical Therapy

## 2017-03-16 ENCOUNTER — Ambulatory Visit: Payer: BC Managed Care – PPO | Admitting: Physical Therapy

## 2017-03-16 DIAGNOSIS — M542 Cervicalgia: Secondary | ICD-10-CM

## 2017-03-16 DIAGNOSIS — M545 Low back pain, unspecified: Secondary | ICD-10-CM
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
## 2017-03-18 ENCOUNTER — Ambulatory Visit: Payer: BC Managed Care – PPO | Admitting: Physical Therapy

## 2017-03-23 ENCOUNTER — Ambulatory Visit: Payer: BC Managed Care – PPO | Admitting: Physical Therapy

## 2017-03-25 ENCOUNTER — Ambulatory Visit: Payer: BC Managed Care – PPO | Admitting: Physical Therapy

## 2017-05-04 ENCOUNTER — Ambulatory Visit (INDEPENDENT_AMBULATORY_CARE_PROVIDER_SITE_OTHER): Payer: BC Managed Care – PPO | Admitting: Orthopaedic Surgery

## 2017-07-17 ENCOUNTER — Encounter: Payer: Self-pay | Admitting: Emergency Medicine

## 2017-07-17 ENCOUNTER — Ambulatory Visit (INDEPENDENT_AMBULATORY_CARE_PROVIDER_SITE_OTHER): Payer: BC Managed Care – PPO | Admitting: Emergency Medicine

## 2017-07-17 ENCOUNTER — Telehealth: Payer: Self-pay | Admitting: *Deleted

## 2017-07-17 VITALS — BP 138/82 | HR 87 | Temp 97.7°F | Resp 16 | Ht 65.25 in | Wt 213.4 lb

## 2017-07-17 DIAGNOSIS — S161XXD Strain of muscle, fascia and tendon at neck level, subsequent encounter: Secondary | ICD-10-CM

## 2017-07-17 DIAGNOSIS — M5030 Other cervical disc degeneration, unspecified cervical region: Secondary | ICD-10-CM | POA: Diagnosis not present

## 2017-07-17 DIAGNOSIS — M542 Cervicalgia: Secondary | ICD-10-CM | POA: Diagnosis not present

## 2017-07-17 MED ORDER — CYCLOBENZAPRINE HCL 10 MG PO TABS: 10.0000 mg | ORAL_TABLET | Freq: Three times a day (TID) | ORAL | 0 refills | Status: DC | PRN

## 2017-07-17 MED ORDER — DICLOFENAC SODIUM 75 MG PO TBEC: 75.0000 mg | DELAYED_RELEASE_TABLET | Freq: Two times a day (BID) | ORAL | 0 refills | Status: AC

## 2017-07-17 MED ORDER — DICLOFENAC SODIUM 75 MG PO TBEC: 75.0000 mg | DELAYED_RELEASE_TABLET | Freq: Two times a day (BID) | ORAL | 0 refills | Status: DC
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
## 2017-07-17 NOTE — Patient Instructions (Addendum)
   IF you received an x-ray today, you will receive an invoice from [facility]. Please contact [facility] at [phone] with questions or concerns regarding your invoice.   IF you received labwork today, you will receive an invoice from [facility]. Please contact [facility] at [phone] with questions or concerns regarding your invoice.   Our billing staff will not be able to assist you with questions regarding bills from these companies.  You will be contacted with the lab results as soon as they are available. The fastest way to get your results is to activate your My Chart account. Instructions are located on the last page of this paperwork. If you have not heard from us regarding the results in 2 weeks, please contact this office.     Muscle Pain, Adult Muscle pain (myalgia) may be mild or severe. In most cases, the pain lasts only a short time and it goes away without treatment. It is normal to feel some muscle pain after starting a workout program. Muscles that have not been used often will be sore at first. Muscle pain may also be caused by many other things, including:  Overuse or muscle strain, especially if you are not in shape. This is the most common cause of muscle pain.  Injury.  Bruises.  Viruses, such as the flu.  Infectious diseases.  A chronic condition that causes muscle tenderness, fatigue, and headache (fibromyalgia).  A condition, such as lupus, in which the body's disease-fighting system attacks other organs in the body (autoimmune or rheumatologic diseases).  Certain drugs, including ACE inhibitors and statins.  To diagnose the cause of your muscle pain, your health care provider will do a physical exam and ask questions about the pain and when it began. If you have not had muscle pain for very long, your health care provider may want to wait before doing much testing. If your muscle pain has lasted a long time, your health care provider  may want to run tests right away. In some cases, this may include tests to rule out certain conditions or illnesses. Treatment for muscle pain depends on the cause. Home care is often enough to relieve muscle pain. Your health care provider may also prescribe anti-inflammatory medicine. Follow these instructions at home: Activity  If overuse is causing your muscle pain: ? Slow down your activities until the pain goes away. ? Do regular, gentle exercises if you are not usually active. ? Warm up before exercising. Stretch before and after exercising. This can help lower the risk of muscle pain.  Do not continue working out if the pain is very bad. Bad pain could mean that you have injured a muscle. Managing pain and discomfort   If directed, apply ice to the sore muscle: ? Put ice in a plastic bag. ? Place a towel between your skin and the bag. ? Leave the ice on for 20 minutes, 2-3 times a day.  You may also alternate between applying ice and applying heat as told by your health care provider. To apply heat, use the heat source that your health care provider recommends, such as a moist heat pack or a heating pad. ? Place a towel between your skin and the heat source. ? Leave the heat on for 20-30 minutes. ? Remove the heat if your skin turns bright red. This is especially important if you are unable to feel pain, heat, or cold. You may have a greater risk of getting burned. Medicines  Take   over-the-counter and prescription medicines only as told by your health care provider.  Do not drive or use heavy machinery while taking prescription pain medicine. Contact a health care provider if:  Your muscle pain gets worse and medicines do not help.  You have muscle pain that lasts longer than 3 days.  You have a rash or fever along with muscle pain.  You have muscle pain after a tick bite.  You have muscle pain while working out, even though you are in good physical condition.  You  have redness, soreness, or swelling along with muscle pain.  You have muscle pain after starting a new medicine or changing the dose of a medicine. Get help right away if:  You have trouble breathing.  You have trouble swallowing.  You have muscle pain along with a stiff neck, fever, and vomiting.  You have severe muscle weakness or cannot move part of your body. This information is not intended to replace advice given to you by your health care provider. Make sure you discuss any questions you have with your health care provider. Document Released: 08/06/2006 Document Revised: 04/03/2016 Document Reviewed: 02/04/2016 Elsevier Interactive Patient Education  2018 Elsevier Inc.  

## 2017-07-17 NOTE — Telephone Encounter (Signed)
Called [facility] on [address] to cancel diclofenac and cyclobenzaprine. Spoke to Tesoro CorporationDalend he will cancel Rxs. The Rxs were sent to another pharmacy.

## 2017-07-27 ENCOUNTER — Telehealth: Payer: Self-pay | Admitting: Emergency Medicine

## 2017-07-27 NOTE — Telephone Encounter (Signed)
Pt insurance did not authorize her MR CERVICAL SPINE WO CONTRAST stated that need more clinical information.Marland Kitchen. You can do a peer to peer where you can give more clinical information.. [phone]

## 2017-07-27 NOTE — Telephone Encounter (Signed)
Called and left pt a detailed message stating that she needs to call [facility] to follow up..Marland Kitchen

## 2017-07-27 NOTE — Telephone Encounter (Signed)
Needs to follow up with Orthopedist before approval then. Thanks.

## 2017-08-05 ENCOUNTER — Other Ambulatory Visit: Payer: Self-pay | Admitting: Family Medicine

## 2017-08-06 NOTE — Telephone Encounter (Signed)
Phentermine is not on our list we can refill. Thanks

## 2017-08-10 NOTE — Telephone Encounter (Signed)
It is about time that pt start to wean off her phenteramine and try to maintain her weight. She has been on it for a long time and over the past 6 mos has lost another 5 lbs so I think things are slowing down so time to come off. She can wean down to 1 tab in the morning for the next month, then go off. If she has any problems w/ this plan or after she goes off, rec f/u OV w/ me to discuss.

## 2017-08-11 NOTE — Telephone Encounter (Signed)
Spoke with pt - gave Dr. Alver FisherShaw's message.  Pt verbalized understanding.

## 2017-09-16 ENCOUNTER — Other Ambulatory Visit: Payer: Self-pay | Admitting: Family Medicine

## 2017-10-16 ENCOUNTER — Encounter: Payer: Self-pay | Admitting: Family Medicine

## 2017-10-16 ENCOUNTER — Other Ambulatory Visit: Payer: Self-pay

## 2017-10-16 ENCOUNTER — Ambulatory Visit: Payer: BC Managed Care – PPO | Admitting: Family Medicine

## 2017-10-16 VITALS — BP 152/78 | HR 100 | Temp 98.0°F | Resp 16 | Ht 65.0 in | Wt 225.0 lb

## 2017-10-16 DIAGNOSIS — M62838 Other muscle spasm: Secondary | ICD-10-CM | POA: Diagnosis not present

## 2017-10-16 DIAGNOSIS — M5010 Cervical disc disorder with radiculopathy, unspecified cervical region: Secondary | ICD-10-CM | POA: Diagnosis not present

## 2017-10-16 MED ORDER — CYCLOBENZAPRINE HCL 10 MG PO TABS: 10.0000 mg | ORAL_TABLET | Freq: Three times a day (TID) | ORAL | 0 refills | Status: DC | PRN

## 2017-10-16 MED ORDER — OXYCODONE-ACETAMINOPHEN 5-325 MG PO TABS: 1.0000 | ORAL_TABLET | ORAL | 0 refills | Status: DC | PRN

## 2017-10-16 MED ORDER — PREDNISONE 20 MG PO TABS: ORAL_TABLET | ORAL | 0 refills | Status: DC

## 2017-10-16 MED ORDER — OXYCODONE-ACETAMINOPHEN 5-325 MG PO TABS: 1.0000 | ORAL_TABLET | Freq: Four times a day (QID) | ORAL | 0 refills | Status: DC | PRN

## 2017-10-16 NOTE — Patient Instructions (Addendum)
IF you received an x-ray today, you will receive an invoice from [facility]. Please contact [facility] at [phone] with questions or concerns regarding your invoice.   IF you received labwork today, you will receive an invoice from [facility]. Please contact [facility] at [phone] with questions or concerns regarding your invoice.   Our billing staff will not be able to assist you with questions regarding bills from these companies.  You will be contacted with the lab results as soon as they are available. The fastest way to get your results is to activate your My Chart account. Instructions are located on the last page of this paperwork. If you have not heard from [place] regarding the results in 2 weeks, please contact this office.     Cervical Radiculopathy Cervical radiculopathy happens when a nerve in the neck (cervical nerve) is pinched or bruised. This condition can develop because of an injury or as part of the normal aging process. Pressure on the cervical nerves can cause pain or numbness that runs from the neck all the way down into the arm and fingers. Usually, this condition gets better with rest. Treatment may be needed if the condition does not improve. What are the causes? This condition may be caused by:  Injury.  Slipped (herniated) disk.  Muscle tightness in the neck because of overuse.  Arthritis.  Breakdown or degeneration in the bones and joints of the spine (spondylosis) due to aging.  Bone spurs that may develop near the cervical nerves.  What are the signs or symptoms? Symptoms of this condition include:  Pain that runs from the neck to the arm and hand. The pain can be severe or irritating. It may be worse when the neck is moved.  Numbness or weakness in the affected arm and hand.  How is this diagnosed? This condition may be diagnosed based on symptoms, medical history, and a physical exam. You may also have tests,  including:  X-rays.  CT scan.  MRI.  Electromyogram (EMG).  Nerve conduction tests.  How is this treated? In many cases, treatment is not needed for this condition. With rest, the condition usually gets better over time. If treatment is needed, options may include:  Wearing a soft neck collar for short periods of time.  Physical therapy to strengthen your neck muscles.  Medicines, such as NSAIDs, oral corticosteroids, or spinal injections.  Surgery. This may be needed if other treatments do not help. Various types of surgery may be done depending on the cause of your problems.  Follow these instructions at home: Managing pain  Take over-the-counter and prescription medicines only as told by your health care provider.  If directed, apply ice to the affected area. ? Put ice in a plastic bag. ? Place a towel between your skin and the bag. ? Leave the ice on for 20 minutes, 2-3 times per day.  If ice does not help, you can try using heat. Take a warm shower or warm bath, or use a heat pack as told by your health care provider.  Try a gentle neck and shoulder massage to help relieve symptoms. Activity  Rest as needed. Follow instructions from your health care provider about any restrictions on activities.  Do stretching and strengthening exercises as told by your health care provider or physical therapist. General instructions  If you were given a soft collar, wear it as told by your health care provider.  Use a flat pillow when you sleep.  Keep all follow-up visits as told by your health care provider. This is important. Contact a health care provider if:  Your condition does not improve with treatment. Get help right away if:  Your pain gets much worse and cannot be controlled with medicines.  You have weakness or numbness in your hand, arm, face, or leg.  You have a high fever.  You have a stiff, rigid neck.  You lose control of your bowels or your bladder  (have incontinence).  You have trouble with walking, balance, or speaking. This information is not intended to replace advice given to you by your health care provider. Make sure you discuss any questions you have with your health care provider. Document Released: 06/09/2001 Document Revised: 02/20/2016 Document Reviewed: 11/08/2014 Elsevier Interactive Patient Education  2018 [employer] Inc.  Neck Exercises Neck exercises can be important for many reasons:  They can help you to improve and maintain flexibility in your neck. This can be especially important as you age.  They can help to make your neck stronger. This can make movement easier.  They can reduce or prevent neck pain.  They may help your upper back.  Ask your health care provider which neck exercises would be best for you. Exercises Neck Press Repeat this exercise 10 times. Do it first thing in the morning and right before bed or as told by your health care provider. 1. Lie on your back on a firm bed or on the floor with a pillow under your head. 2. Use your neck muscles to push your head down on the pillow and straighten your spine. 3. Hold the position as well as you can. Keep your head facing up and your chin tucked. 4. Slowly count to 5 while holding this position. 5. Relax for a few seconds. Then repeat.  Isometric Strengthening Do a full set of these exercises 2 times a day or as told by your health care provider. 1. Sit in a supportive chair and place your hand on your forehead. 2. Push forward with your head and neck while pushing back with your hand. Hold for 10 seconds. 3. Relax. Then repeat the exercise 3 times. 4. Next, do thesequence again, this time putting your hand against the back of your head. Use your head and neck to push backward against the hand pressure. 5. Finally, do the same exercise on either side of your head, pushing sideways against the pressure of your hand.  Prone Head Lifts Repeat this  exercise 5 times. Do this 2 times a day or as told by your health care provider. 1. Lie face-down, resting on your elbows so that your chest and upper back are raised. 2. Start with your head facing downward, near your chest. Position your chin either on or near your chest. 3. Slowly lift your head upward. Lift until you are looking straight ahead. Then continue lifting your head as far back as you can stretch. 4. Hold your head up for 5 seconds. Then slowly lower it to your starting position.  Supine Head Lifts Repeat this exercise 8-10 times. Do this 2 times a day or as told by your health care provider. 1. Lie on your back, bending your knees to point to the ceiling and keeping your feet flat on the floor. 2. Lift your head slowly off the floor, raising your chin toward your chest. 3. Hold for 5 seconds. 4. Relax and repeat.  Scapular Retraction Repeat this exercise 5 times. Do this 2 times  a day or as told by your health care provider. 1. Stand with your arms at your sides. Look straight ahead. 2. Slowly pull both shoulders backward and downward until you feel a stretch between your shoulder blades in your upper back. 3. Hold for 10-30 seconds. 4. Relax and repeat.  Contact a health care provider if:  Your neck pain or discomfort gets much worse when you do an exercise.  Your neck pain or discomfort does not improve within 2 hours after you exercise. If you have any of these problems, stop exercising right away. Do not do the exercises again unless your health care provider says that you can. Get help right away if:  You develop sudden, severe neck pain. If this happens, stop exercising right away. Do not do the exercises again unless your health care provider says that you can. Exercises Neck Stretch  Repeat this exercise 3-5 times. 1. Do this exercise while standing or while sitting in a chair. 2. Place your feet flat on the floor, shoulder-width apart. 3. Slowly turn your  head to the right. Turn it all the way to the right so you can look over your right shoulder. Do not tilt or tip your head. 4. Hold this position for 10-30 seconds. 5. Slowly turn your head to the left, to look over your left shoulder. 6. Hold this position for 10-30 seconds.  Neck Retraction Repeat this exercise 8-10 times. Do this 3-4 times a day or as told by your health care provider. 1. Do this exercise while standing or while sitting in a sturdy chair. 2. Look straight ahead. Do not bend your neck. 3. Use your fingers to push your chin backward. Do not bend your neck for this movement. Continue to face straight ahead. If you are doing the exercise properly, you will feel a slight sensation in your throat and a stretch at the back of your neck. 4. Hold the stretch for 1-2 seconds. Relax and repeat.  This information is not intended to replace advice given to you by your health care provider. Make sure you discuss any questions you have with your health care provider. Document Released: 08/26/2015 Document Revised: 02/20/2016 Document Reviewed: 03/25/2015 Elsevier Interactive Patient Education  [employer].

## 2017-10-16 NOTE — Progress Notes (Signed)
Subjective:  By signing my name below, I, Katherine [name], attest that this documentation has been prepared under the direction and in the presence of Katherine Sorenson, MD. Electronically Signed: Stann [name], Scribe. 10/16/2017 , 3:26 PM .  Patient was seen in Room 1 .   Patient ID: Katherine [name], female    DOB: 10-28-1955, 62 y.o.   MRN: [id number] Chief Complaint  Patient presents with  . Arm Pain    under left, painful and tender/ x 1 wk. pt states it is difficulit to lift items. feels like pain is in neck sometimes   HPI Katherine [name] is a 62 y.o. female who presents to Primary Care at [facility] complaining of progressively worsening left arm pain that started out of the blue about 1-1.5 weeks ago. She states it's difficult to lift or turn objects with her left arm, with pulling pain up into the left side of her neck. She took a leftover percocet with relief at 10:00PM last night. She mentions helping her daughter move recently. She also notes having some relief by lifting her left arm and resting it onto her head. She mentions having active tingling in her left hand while in office today. She has a history of right rotator cuff surgery in Nov 2015, done by Dr. Dion Saucier. She had an xray of her neck done 10 months ago, but not MRI.   She notes not needing heartburn medication anymore.   Past Medical History:  Diagnosis Date  . Hypertension    Past Surgical History:  Procedure Laterality Date  . CHOLECYSTECTOMY    . rotator cuff surgery     Prior to Admission medications   Medication Sig Start Date End Date Taking? Authorizing Provider  amLODipine (NORVASC) 5 MG tablet TAKE ONE TABLET BY MOUTH ONCE DAILY 08/08/17   Sherren Mocha, MD  aspirin EC 81 MG tablet Take 81 mg by mouth daily.    [provider]  Biotin 1000 MCG tablet Take 1,000 mcg by mouth 3 (three) times daily.    [provider]  cyclobenzaprine (FLEXERIL) 10 MG tablet Take 1 tablet (10 mg total) by mouth  3 (three) times daily as needed for muscle spasms. 07/17/17   Georgina Quint, MD  diazepam (VALIUM) 5 MG tablet Take 1 tablet (5 mg total) by mouth at bedtime and may repeat dose one time if needed. Patient not taking: Reported on 07/17/2017 01/09/17   Sherren Mocha, MD  metFORMIN (GLUCOPHAGE) 500 MG tablet TAKE ONE TABLET BY MOUTH TWICE DAILY WITH A MEAL 09/16/17   Sherren Mocha, MD  Multiple Vitamin (MULTIVITAMIN) tablet Take 1 tablet by mouth daily.    [provider]  phentermine 15 MG capsule TAKE 1 CAPSULE BY MOUTH EVERY MORNING the 1 month, then stop med. 08/10/17   Sherren Mocha, MD  pyridOXINE (VITAMIN B-6) 100 MG tablet Take 100 mg by mouth daily.    [provider]  ranitidine (ZANTAC) 150 MG tablet TAKE ONE TABLET BY MOUTH TWICE DAILY AS NEEDED FOR  HEARTBURN 01/21/17   Sherren Mocha, MD  vitamin B-12 (CYANOCOBALAMIN) 1000 MCG tablet Take 1,000 mcg by mouth daily.    [provider]   Allergies  Allergen Reactions  . Ace Inhibitors Cough  . Adhesive [Tape] Rash   Family History  Problem Relation Age of Onset  . Diabetes Father   . Hypertension Father   . Sickle cell anemia Brother   . Cancer Brother 13  Leukemia, passed away at 16  . Cancer Maternal Grandmother        breast cancer  . Breast cancer Neg Hx    Social History   Socioeconomic History  . Marital status: Married    Spouse name: Not on file  . Number of children: Not on file  . Years of education: Masters   . Highest education level: Not on file  Social Needs  . Financial resource strain: Not on file  . Food insecurity - worry: Not on file  . Food insecurity - inability: Not on file  . Transportation needs - medical: Not on file  . Transportation needs - non-medical: Not on file  Occupational History  . Occupation: Runner, broadcasting/film/videoTeacher  Tobacco Use  . Smoking status: Never Smoker  . Smokeless tobacco: Never Used  Substance and Sexual Activity  . Alcohol use: Yes    Alcohol/week: 0.0  oz    Comment: social  . Drug use: No  . Sexual activity: Yes  Other Topics Concern  . Not on file  Social History Narrative   Patient has had 2 pregnancies and has 2 living children.   [facility] 07/17/2017 01/21/2017 01/09/2017 12/21/2016 12/10/2016  Decreased Interest 0 0 0 0 0  Down, Depressed, Hopeless 0 0 0 0 0  PHQ - 2 Score 0 0 0 0 0    Review of Systems  Constitutional: Negative for chills, fatigue, fever and unexpected weight change.  Respiratory: Negative for cough.   Gastrointestinal: Negative for constipation, diarrhea, nausea and vomiting.  Musculoskeletal: Positive for myalgias and neck pain.  Skin: Negative for rash and wound.  Neurological: Positive for weakness. Negative for dizziness and headaches.       Objective:   Physical Exam  Constitutional: She is oriented to person, place, and time. She appears well-developed and well-nourished. No distress.  HENT:  Head: Normocephalic and atraumatic.  Eyes: EOM are normal. Pupils are equal, round, and reactive to light.  Neck: Neck supple.  Cardiovascular: Normal rate.  bowel sounds heard in chest  Pulmonary/Chest: Effort normal. No respiratory distress.  Musculoskeletal: Normal range of motion.  4+/5 grasp strength on left, 5/5 on right, normal opposition, 4+5 bicep strength on the left, 5/5 on the right, 4/5 tricep strength on the left, 5/5 on the right, 5/5 deltoid strength bilaterally; moderate to severe restriction on C-spine extension, normal C-spine flexion, pain with left lateral rotation and left lateral flexion with moderate to severe restriction; positive spurling with extension in left lateral rotation, negative tinel's, possible positive phalen's   Neurological: She is alert and oriented to person, place, and time.  Reflex Scores:      Bicep reflexes are 2+ on the right side and 2+ on the left side.      Brachioradialis reflexes are 1+ on the right side and 1+ on the left side. DTR's: 2+  biceps bilaterally, trace brachioradialis bilaterally, unable to illicit triceps  Skin: Skin is warm and dry.  Psychiatric: She has a normal mood and affect. Her behavior is normal.  Nursing note and vitals reviewed.   BP (!) 152/78   Pulse 100   Temp 98 F (36.7 C) (Oral)   Resp 16   Ht 5\' 5"  (1.651 m)   Wt 225 lb (102.1 kg)   SpO2 97%   BMI 37.44 kg/m      Assessment & Plan:   1. Cervical disc disorder with radiculopathy of cervical region   2. Cervical paraspinal muscle spasm  Meds ordered this encounter  Medications  . predniSONE (DELTASONE) 20 MG tablet    Sig: Take 3 tabs qd x 3d, then 2 tabs qd x 3d then 1 tab qd x 3d.    Dispense:  18 tablet    Refill:  0  . DISCONTD: oxyCODONE-acetaminophen (PERCOCET/ROXICET) 5-325 MG tablet    Sig: Take 1-2 tablets by mouth every 4 (four) hours as needed for severe pain.    Dispense:  20 tablet    Refill:  0  . cyclobenzaprine (FLEXERIL) 10 MG tablet    Sig: Take 1 tablet (10 mg total) by mouth 3 (three) times daily as needed for muscle spasms.    Dispense:  30 tablet    Refill:  0  . oxyCODONE-acetaminophen (PERCOCET/ROXICET) 5-325 MG tablet    Sig: Take 1 tablet by mouth every 6 (six) hours as needed for severe pain.    Dispense:  20 tablet    Refill:  0    Please d/c prior rx from myself and use this sig instead. Thank you    I personally performed the services described in this documentation, which was scribed in my presence. The recorded information has been reviewed and considered, and addended by me as needed.   Katherine [name], M.D.  Primary Care at [facility] [address] [place], [place] [address] [phone] phone [phone] fax  10/16/17 5:50 PM

## 2017-10-27 ENCOUNTER — Telehealth: Payer: Self-pay | Admitting: Family Medicine

## 2017-10-27 DIAGNOSIS — M5010 Cervical disc disorder with radiculopathy, unspecified cervical region: Secondary | ICD-10-CM

## 2017-10-27 NOTE — Telephone Encounter (Signed)
Copied from CRM [phone]. Topic: Quick Communication - See Telephone Encounter >> Oct 27, 2017  4:26 PM Terisa Starraylor, Brittany L wrote: CRM for notification. See Telephone encounter for:   10/27/17.  Patient said she is still having some of the same pain in her shoulder and the tingling in her fingers, she wants to know what else she should further do to make it to stop.   Call back is [phone] She said she followed all the instructions and has taken all meds she was given by Dr Clelia CroftShaw.
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
## 2017-10-28 NOTE — Telephone Encounter (Signed)
Sent message to Dr. Clelia CroftSHaw re: continuing symptoms

## 2017-10-29 NOTE — Telephone Encounter (Signed)
Pt notified and verbalized understanding.

## 2017-11-10 ENCOUNTER — Telehealth: Payer: Self-pay | Admitting: Family Medicine

## 2017-11-10 NOTE — Telephone Encounter (Signed)
Pt MRI is under case review with [employer]. Case is to close on 11/12/17. I will place a message regarding their decision. Thanks!

## 2017-11-15 ENCOUNTER — Telehealth: Payer: Self-pay | Admitting: Family Medicine

## 2017-11-15 NOTE — Telephone Encounter (Signed)
Prior auth request for MRI (CPT [id number]) Cervical Spine W/O Contrast has not been approved by [employer]. Information on how to proceed with a peer to peer or appeal has been placed in provider's box. Thanks!

## 2017-11-16 ENCOUNTER — Telehealth: Payer: Self-pay | Admitting: Family Medicine

## 2017-11-16 NOTE — Telephone Encounter (Signed)
Ok, pt has appt tomorrow for her sxs so may just do this DURING her appt

## 2017-11-16 NOTE — Telephone Encounter (Signed)
Called and spoke with pt to confirm apt tomorrow 11/17/17. Advised of time, building # and time policies. °

## 2017-11-17 ENCOUNTER — Encounter: Payer: Self-pay | Admitting: Family Medicine

## 2017-11-17 ENCOUNTER — Ambulatory Visit (INDEPENDENT_AMBULATORY_CARE_PROVIDER_SITE_OTHER): Payer: BC Managed Care – PPO

## 2017-11-17 ENCOUNTER — Ambulatory Visit: Payer: BC Managed Care – PPO | Admitting: Family Medicine

## 2017-11-17 ENCOUNTER — Other Ambulatory Visit: Payer: Self-pay

## 2017-11-17 VITALS — BP 126/82 | HR 105 | Temp 98.6°F | Resp 16 | Ht 65.0 in | Wt 229.4 lb

## 2017-11-17 DIAGNOSIS — R202 Paresthesia of skin: Secondary | ICD-10-CM

## 2017-11-17 DIAGNOSIS — G5692 Unspecified mononeuropathy of left upper limb: Secondary | ICD-10-CM

## 2017-11-17 DIAGNOSIS — M4722 Other spondylosis with radiculopathy, cervical region: Secondary | ICD-10-CM | POA: Diagnosis not present

## 2017-11-17 DIAGNOSIS — M5030 Other cervical disc degeneration, unspecified cervical region: Secondary | ICD-10-CM

## 2017-11-17 MED ORDER — GABAPENTIN 300 MG PO CAPS: ORAL_CAPSULE | ORAL | 1 refills | Status: DC

## 2017-11-17 NOTE — Telephone Encounter (Signed)
Have more info so will just delete and place new order that will hopefully go through
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
## 2017-11-17 NOTE — Patient Instructions (Addendum)
 IF you received an x-ray today, you will receive an invoice from [facility]. Please contact [facility] at [phone] with questions or concerns regarding your invoice.   IF you received labwork today, you will receive an invoice from [facility]. Please contact [facility] at [phone] with questions or concerns regarding your invoice.   Our billing staff will not be able to assist you with questions regarding bills from these companies.  You will be contacted with the lab results as soon as they are available. The fastest way to get your results is to activate your My Chart account. Instructions are located on the last page of this paperwork. If you have not heard from us regarding the results in 2 weeks, please contact this office.      Cervical Radiculopathy Cervical radiculopathy happens when a nerve in the neck (cervical nerve) is pinched or bruised. This condition can develop because of an injury or as part of the normal aging process. Pressure on the cervical nerves can cause pain or numbness that runs from the neck all the way down into the arm and fingers. Usually, this condition gets better with rest. Treatment may be needed if the condition does not improve. What are the causes? This condition may be caused by:  Injury.  Slipped (herniated) disk.  Muscle tightness in the neck because of overuse.  Arthritis.  Breakdown or degeneration in the bones and joints of the spine (spondylosis) due to aging.  Bone spurs that may develop near the cervical nerves.  What are the signs or symptoms? Symptoms of this condition include:  Pain that runs from the neck to the arm and hand. The pain can be severe or irritating. It may be worse when the neck is moved.  Numbness or weakness in the affected arm and hand.  How is this diagnosed? This condition may be diagnosed based on symptoms, medical history, and a physical exam. You may also have tests,  including:  X-rays.  CT scan.  MRI.  Electromyogram (EMG).  Nerve conduction tests.  How is this treated? In many cases, treatment is not needed for this condition. With rest, the condition usually gets better over time. If treatment is needed, options may include:  Wearing a soft neck collar for short periods of time.  Physical therapy to strengthen your neck muscles.  Medicines, such as NSAIDs, oral corticosteroids, or spinal injections.  Surgery. This may be needed if other treatments do not help. Various types of surgery may be done depending on the cause of your problems.  Follow these instructions at home: Managing pain  Take over-the-counter and prescription medicines only as told by your health care provider.  If directed, apply ice to the affected area. ? Put ice in a plastic bag. ? Place a towel between your skin and the bag. ? Leave the ice on for 20 minutes, 2-3 times per day.  If ice does not help, you can try using heat. Take a warm shower or warm bath, or use a heat pack as told by your health care provider.  Try a gentle neck and shoulder massage to help relieve symptoms. Activity  Rest as needed. Follow instructions from your health care provider about any restrictions on activities.  Do stretching and strengthening exercises as told by your health care provider or physical therapist. General instructions  If you were given a soft collar, wear it as told by your health care provider.  Use a flat pillow when you sleep.    Keep all follow-up visits as told by your health care provider. This is important. Contact a health care provider if:  Your condition does not improve with treatment. Get help right away if:  Your pain gets much worse and cannot be controlled with medicines.  You have weakness or numbness in your hand, arm, face, or leg.  You have a high fever.  You have a stiff, rigid neck.  You lose control of your bowels or your bladder  (have incontinence).  You have trouble with walking, balance, or speaking. This information is not intended to replace advice given to you by your health care provider. Make sure you discuss any questions you have with your health care provider. Document Released: 06/09/2001 Document Revised: 02/20/2016 Document Reviewed: 11/08/2014 Elsevier Interactive Patient Education  2018 Elsevier Inc.  

## 2017-11-18 LAB — SEDIMENTATION RATE: Sed Rate: 31 mm/hr (ref 0–40)

## 2017-11-18 LAB — RPR: RPR Ser Ql: NONREACTIVE

## 2017-11-18 LAB — CBC WITH DIFFERENTIAL/PLATELET
BASOS ABS: 0 10*3/uL (ref 0.0–0.2)
Basos: 1 %
EOS (ABSOLUTE): 0.1 10*3/uL (ref 0.0–0.4)
EOS: 2 %
HEMATOCRIT: 42.4 % (ref 34.0–46.6)
HEMOGLOBIN: 14.3 g/dL (ref 11.1–15.9)
Immature Grans (Abs): 0 10*3/uL (ref 0.0–0.1)
Immature Granulocytes: 0 %
LYMPHS: 52 %
Lymphocytes Absolute: 2.7 10*3/uL (ref 0.7–3.1)
MCH: 28.4 pg (ref 26.6–33.0)
MCHC: 33.7 g/dL (ref 31.5–35.7)
MCV: 84 fL (ref 79–97)
MONOCYTES: 7 %
Monocytes Absolute: 0.4 10*3/uL (ref 0.1–0.9)
NEUTROS PCT: 38 %
Neutrophils Absolute: 1.9 10*3/uL (ref 1.4–7.0)
Platelets: 308 10*3/uL (ref 150–379)
RBC: 5.04 x10E6/uL (ref 3.77–5.28)
RDW: 15.5 % — ABNORMAL HIGH (ref 12.3–15.4)
WBC: 5.1 10*3/uL (ref 3.4–10.8)

## 2017-11-18 LAB — COMPREHENSIVE METABOLIC PANEL
ALBUMIN: 4.3 g/dL (ref 3.6–4.8)
ALK PHOS: 117 IU/L (ref 39–117)
ALT: 17 IU/L (ref 0–32)
AST: 14 IU/L (ref 0–40)
Albumin/Globulin Ratio: 1.3 (ref 1.2–2.2)
BILIRUBIN TOTAL: 0.3 mg/dL (ref 0.0–1.2)
BUN / CREAT RATIO: 15 (ref 12–28)
BUN: 12 mg/dL (ref 8–27)
CHLORIDE: 104 mmol/L (ref 96–106)
CO2: 21 mmol/L (ref 20–29)
CREATININE: 0.82 mg/dL (ref 0.57–1.00)
Calcium: 9.5 mg/dL (ref 8.7–10.3)
GFR calc Af Amer: 88 mL/min/{1.73_m2} (ref >59)
GFR calc non Af Amer: 76 mL/min/{1.73_m2} (ref >59)
GLUCOSE: 118 mg/dL — AB (ref 65–99)
Globulin, Total: 3.3 g/dL (ref 1.5–4.5)
Potassium: 4.3 mmol/L (ref 3.5–5.2)
Sodium: 143 mmol/L (ref 134–144)
Total Protein: 7.6 g/dL (ref 6.0–8.5)

## 2017-11-18 LAB — HEMOGLOBIN A1C
ESTIMATED AVERAGE GLUCOSE: 120 mg/dL
Hgb A1c MFr Bld: 5.8 % — ABNORMAL HIGH (ref 4.8–5.6)

## 2017-11-18 LAB — VITAMIN B12: Vitamin B-12: >2000 pg/mL — ABNORMAL HIGH (ref 232–1245)

## 2017-11-18 LAB — TSH: TSH: 0.779 u[IU]/mL (ref 0.450–4.500)

## 2017-11-26 ENCOUNTER — Telehealth: Payer: Self-pay | Admitting: Family Medicine

## 2017-11-26 NOTE — Telephone Encounter (Signed)
Tried to get prior auth for pt MR CERVICAL SPINE WO CONTRAST but insurance denied it.. A peer to peer can be done by calling [employer] at [phone]. Pt Insurance id# [id number] dob: May 23, 1956.  Thanks

## 2017-11-30 DIAGNOSIS — M4722 Other spondylosis with radiculopathy, cervical region: Secondary | ICD-10-CM | POA: Insufficient documentation

## 2017-11-30 NOTE — Telephone Encounter (Signed)
Called - thanks for providing the case details for the call - very helpful.  Apparently this second order is denied because you have to wait 60d after the first order is denied before a new claim can be filed. However, I can make an appeal on the first denial since we now have updated info from when it was initially submitted - which is pt's 1 mo f/u OV 2/20 with c-spine xray, etc.  LVM on the [employer] Appeals line with pt info requesting that they do open an appeal on the c-spine MRI and was told that someone will contact us to get more info - told them to ask to speak to someone on our referrals team - will need to fax them the 2/20 OV, xray, labs at least.  Let me know if there is something else I can help with. Thanks so much.

## 2017-11-30 NOTE — Telephone Encounter (Signed)
Debbie - Appeal nurses at [facility] [phone]  Regarding: Imaging MRI cervical spine -   [employer] case can be reopen as a courtesy review   Please fax according documentation:   Office notes,  other imaging and provider can do letter medical neccessary with   Any supporting clinic info.  This must be done by By Friday March 8   fax to : [phone]  Cover sheet should have attn. Appeals pt. Name /BD insurance ID information

## 2017-12-02 NOTE — Telephone Encounter (Signed)
Faxed pt clinical info to [phone] to see if an appeal can be done.

## 2017-12-02 NOTE — Telephone Encounter (Signed)
Returned pt's call and left a message on whats going on with her mri referral. Told pt to call back if she has any questions

## 2017-12-03 NOTE — Telephone Encounter (Signed)
Para March[facility]ette from [facility] called with authorization for cervical mri -  Authorization number is [id number] This is good for 30 calendar days effective today, and will exprie on 01/02/18 If any questions, please call [phone] Ask for debbie

## 2017-12-06 ENCOUNTER — Other Ambulatory Visit: Payer: Self-pay | Admitting: Family Medicine

## 2017-12-16 ENCOUNTER — Encounter: Payer: Self-pay | Admitting: Family Medicine

## 2017-12-16 ENCOUNTER — Other Ambulatory Visit: Payer: Self-pay

## 2017-12-16 ENCOUNTER — Ambulatory Visit: Payer: BC Managed Care – PPO | Admitting: Family Medicine

## 2017-12-16 VITALS — BP 136/88 | HR 86 | Temp 98.5°F | Resp 18 | Ht 65.0 in | Wt 236.4 lb

## 2017-12-16 DIAGNOSIS — G5692 Unspecified mononeuropathy of left upper limb: Secondary | ICD-10-CM | POA: Diagnosis not present

## 2017-12-16 DIAGNOSIS — M4722 Other spondylosis with radiculopathy, cervical region: Secondary | ICD-10-CM

## 2017-12-16 DIAGNOSIS — R202 Paresthesia of skin: Secondary | ICD-10-CM

## 2017-12-16 DIAGNOSIS — M5010 Cervical disc disorder with radiculopathy, unspecified cervical region: Secondary | ICD-10-CM | POA: Diagnosis not present

## 2017-12-16 NOTE — Progress Notes (Signed)
Subjective:  By signing my name below, I, [name], attest that this documentation has been prepared under the direction and in the presence of Norberto SorensonEva Ellysa Parrack, MD Electronically Signed: Charline BillsEssence Howell, ED Scribe 12/16/2017 at 3:09 PM.   Patient ID: Katherine [name], female    DOB: 21-Jul-1956, 62 y.o.   MRN: [id number]  Chief Complaint  Patient presents with  . Tingling    Pt states tingling is better. Pt states tingling is in the same area.   . [name]   HPI Katherine [name] is a 62 y.o. female who presents to Primary Care at [facility] for f/u on tingling in her L hand and arm. Pt was authorized for MR onI 3/8 which hasn't been scheduled yet but expires in 9 days. Pt has been taking 500 mcg B12 qd. She has not been taking Gabapentin as symptoms have improved some with exercises and making sure that her head isn't extended while sleeping. Denies new weakness.  Past Medical History:  Diagnosis Date  . Hypertension    Current Outpatient Medications on File Prior to Visit  Medication Sig Dispense Refill  . amLODipine (NORVASC) 5 MG tablet TAKE ONE TABLET BY MOUTH ONCE DAILY 90 tablet 1  . aspirin EC 81 MG tablet Take 81 mg by mouth daily.    . Biotin 1000 MCG tablet Take 1,000 mcg by mouth 3 (three) times daily.    . metFORMIN (GLUCOPHAGE) 500 MG tablet TAKE 1 TABLET BY MOUTH TWICE DAILY WITH A MEAL 60 tablet 0  . Multiple Vitamin (MULTIVITAMIN) tablet Take 1 tablet by mouth daily.    . cyclobenzaprine (FLEXERIL) 10 MG tablet Take 1 tablet (10 mg total) by mouth 3 (three) times daily as needed for muscle spasms. (Patient not taking: Reported on 11/17/2017) 30 tablet 0  . gabapentin (NEURONTIN) 300 MG capsule Take 1 cap po qhs x 3d, then 1 po bid x 4d, then 1 tid (Patient not taking: Reported on 12/16/2017) 90 capsule 1  . oxyCODONE-acetaminophen (PERCOCET/ROXICET) 5-325 MG tablet Take 1 tablet by mouth every 6 (six) hours as needed for severe pain. (Patient not taking: Reported on  11/17/2017) 20 tablet 0  . predniSONE (DELTASONE) 20 MG tablet Take 3 tabs qd x 3d, then 2 tabs qd x 3d then 1 tab qd x 3d. (Patient not taking: Reported on 11/17/2017) 18 tablet 0  . vitamin B-12 (CYANOCOBALAMIN) 1000 MCG tablet Take 1,000 mcg by mouth daily.     No current facility-administered medications on file prior to visit.     Past Surgical History:  Procedure Laterality Date  . APPENDECTOMY    . CHOLECYSTECTOMY    . rotator cuff surgery Right 07/2014   Allergies  Allergen Reactions  . Ace Inhibitors Cough  . Adhesive [Tape] Rash   Family History  Problem Relation Age of Onset  . Diabetes Father   . Hypertension Father   . Sickle cell anemia Brother   . Cancer Brother 15       Leukemia, passed away at 4516  . Cancer Maternal Grandmother        breast cancer  . Breast cancer Neg Hx    Social History   Socioeconomic History  . Marital status: Married    Spouse name: Not on file  . Number of children: 2  . Years of education: Masters   . Highest education level: Not on file  Occupational History  . Occupation: Runner, broadcasting/film/videoTeacher  Social Needs  . Financial resource strain: Not on  file  . Food insecurity:    Worry: Not on file    Inability: Not on file  . Transportation needs:    Medical: Not on file    Non-medical: Not on file  Tobacco Use  . Smoking status: Never Smoker  . Smokeless tobacco: Never Used  Substance and Sexual Activity  . Alcohol use: Yes    Alcohol/week: 0.0 oz    Comment: social  . Drug use: No  . Sexual activity: Yes  Lifestyle  . Physical activity:    Days per week: Not on file    Minutes per session: Not on file  . Stress: Not on file  Relationships  . Social connections:    Talks on phone: Not on file    Gets together: Not on file    Attends religious service: Not on file    Active member of club or organization: Not on file    Attends meetings of clubs or organizations: Not on file    Relationship status: Not on file  Other Topics  Concern  . Not on file  Social History Narrative   Patient has had 2 pregnancies and has 2 living children.   Depression screen [facility] 2/9 02/15/2018 12/16/2017 11/17/2017 07/17/2017 01/21/2017  Decreased Interest 0 0 0 0 0  Down, Depressed, Hopeless 0 0 0 0 0  PHQ - 2 Score 0 0 0 0 0     Review of Systems  Neurological: Positive for weakness (unchanged) and numbness (tingling in L hand/arm).      Objective:   Physical Exam  Constitutional: She is oriented to person, place, and time. She appears well-developed and well-nourished. No distress.  HENT:  Head: Normocephalic and atraumatic.  Eyes: Conjunctivae and EOM are normal.  Neck: Neck supple. No tracheal deviation present.  Cardiovascular: Normal rate.  Pulmonary/Chest: Effort normal. No respiratory distress.  Musculoskeletal: Normal range of motion.  Strength 5/5 bilaterally in UEs.  Neurological: She is alert and oriented to person, place, and time.  Reflex Scores:      Tricep reflexes are 2+ on the right side and 2+ on the left side.      Bicep reflexes are 2+ on the right side and 2+ on the left side.      Brachioradialis reflexes are 2+ on the right side and 2+ on the left side. Skin: Skin is warm and dry.  Psychiatric: She has a normal mood and affect. Her behavior is normal.  Nursing note and vitals reviewed.  BP 136/88 (BP Location: Right Arm, Patient Position: Sitting, Cuff Size: Large)   Pulse 86   Temp 98.5 F (36.9 C) (Oral)   Resp 18   Ht 5\' 5"  (1.651 m)   Wt 236 lb 6.4 oz (107.2 kg)   SpO2 100%   BMI 39.34 kg/m     Assessment & Plan:   1. Cervical disc disorder with radiculopathy of cervical region   2. Upper extremity neuropathy, left   3. Paresthesia of left upper extremity   4. Osteoarthritis of spine with radiculopathy, cervical region    Still having radicular symptoms - will try to file appeal on MRI consideration with insurance and refer to ortho spine.  I personally performed the services  described in this documentation, which was scribed in my presence. The recorded information has been reviewed and considered, and addended by me as needed.   [name], M.D.  Primary Care at [facility] [address] [place], [place] [address] [phone]  [phone] phone [phone] fax  02/23/18 10:23 AM

## 2017-12-16 NOTE — Patient Instructions (Addendum)
 IF you received an x-ray today, you will receive an invoice from [facility]. Please contact [facility] at [phone] with questions or concerns regarding your invoice.   IF you received labwork today, you will receive an invoice from [facility]. Please contact [facility] at [phone] with questions or concerns regarding your invoice.   Our billing staff will not be able to assist you with questions regarding bills from these companies.  You will be contacted with the lab results as soon as they are available. The fastest way to get your results is to activate your My Chart account. Instructions are located on the last page of this paperwork. If you have not heard from us regarding the results in 2 weeks, please contact this office.      Cervical Radiculopathy Cervical radiculopathy happens when a nerve in the neck (cervical nerve) is pinched or bruised. This condition can develop because of an injury or as part of the normal aging process. Pressure on the cervical nerves can cause pain or numbness that runs from the neck all the way down into the arm and fingers. Usually, this condition gets better with rest. Treatment may be needed if the condition does not improve. What are the causes? This condition may be caused by:  Injury.  Slipped (herniated) disk.  Muscle tightness in the neck because of overuse.  Arthritis.  Breakdown or degeneration in the bones and joints of the spine (spondylosis) due to aging.  Bone spurs that may develop near the cervical nerves.  What are the signs or symptoms? Symptoms of this condition include:  Pain that runs from the neck to the arm and hand. The pain can be severe or irritating. It may be worse when the neck is moved.  Numbness or weakness in the affected arm and hand.  How is this diagnosed? This condition may be diagnosed based on symptoms, medical history, and a physical exam. You may also have tests,  including:  X-rays.  CT scan.  MRI.  Electromyogram (EMG).  Nerve conduction tests.  How is this treated? In many cases, treatment is not needed for this condition. With rest, the condition usually gets better over time. If treatment is needed, options may include:  Wearing a soft neck collar for short periods of time.  Physical therapy to strengthen your neck muscles.  Medicines, such as NSAIDs, oral corticosteroids, or spinal injections.  Surgery. This may be needed if other treatments do not help. Various types of surgery may be done depending on the cause of your problems.  Follow these instructions at home: Managing pain  Take over-the-counter and prescription medicines only as told by your health care provider.  If directed, apply ice to the affected area. ? Put ice in a plastic bag. ? Place a towel between your skin and the bag. ? Leave the ice on for 20 minutes, 2-3 times per day.  If ice does not help, you can try using heat. Take a warm shower or warm bath, or use a heat pack as told by your health care provider.  Try a gentle neck and shoulder massage to help relieve symptoms. Activity  Rest as needed. Follow instructions from your health care provider about any restrictions on activities.  Do stretching and strengthening exercises as told by your health care provider or physical therapist. General instructions  If you were given a soft collar, wear it as told by your health care provider.  Use a flat pillow when you sleep.    Keep all follow-up visits as told by your health care provider. This is important. Contact a health care provider if:  Your condition does not improve with treatment. Get help right away if:  Your pain gets much worse and cannot be controlled with medicines.  You have weakness or numbness in your hand, arm, face, or leg.  You have a high fever.  You have a stiff, rigid neck.  You lose control of your bowels or your bladder  (have incontinence).  You have trouble with walking, balance, or speaking. This information is not intended to replace advice given to you by your health care provider. Make sure you discuss any questions you have with your health care provider. Document Released: 06/09/2001 Document Revised: 02/20/2016 Document Reviewed: 11/08/2014 Elsevier Interactive Patient Education  2018 Elsevier Inc.  

## 2017-12-19 ENCOUNTER — Inpatient Hospital Stay: Admission: RE | Admit: 2017-12-19 | Payer: Self-pay | Source: Ambulatory Visit

## 2017-12-24 ENCOUNTER — Telehealth: Payer: Self-pay

## 2017-12-24 NOTE — Telephone Encounter (Signed)
[facility] call, pt canceled MRI due to out of pocket costs - unable to afford.

## 2017-12-26 NOTE — Telephone Encounter (Signed)
Ok, but I do want to make sure she goes to the referral visit at [facility] instead.

## 2018-01-12 ENCOUNTER — Other Ambulatory Visit: Payer: Self-pay | Admitting: Family Medicine

## 2018-01-12 NOTE — Telephone Encounter (Signed)
Metformin 500 mg & Amlodipine 5 mg refill requests  PCP:  Dr. Clelia CroftShaw  LOV:  01/21/17 where these meds are addressed with Dr. Deetta PerlaShaw  L refill:  08/08/17  #90  [facility] [place], [place] - [id number]

## 2018-01-26 ENCOUNTER — Other Ambulatory Visit: Payer: Self-pay | Admitting: Family Medicine

## 2018-01-26 DIAGNOSIS — Z1231 Encounter for screening mammogram for malignant neoplasm of breast: Secondary | ICD-10-CM

## 2018-02-15 ENCOUNTER — Ambulatory Visit (INDEPENDENT_AMBULATORY_CARE_PROVIDER_SITE_OTHER): Payer: BC Managed Care – PPO | Admitting: Family Medicine

## 2018-02-15 ENCOUNTER — Encounter: Payer: Self-pay | Admitting: Family Medicine

## 2018-02-15 ENCOUNTER — Other Ambulatory Visit: Payer: Self-pay

## 2018-02-15 VITALS — BP 127/72

## 2018-02-15 DIAGNOSIS — Z1389 Encounter for screening for other disorder: Secondary | ICD-10-CM

## 2018-02-15 DIAGNOSIS — E669 Obesity, unspecified: Secondary | ICD-10-CM

## 2018-02-15 DIAGNOSIS — S46011S Strain of muscle(s) and tendon(s) of the rotator cuff of right shoulder, sequela: Secondary | ICD-10-CM | POA: Diagnosis not present

## 2018-02-15 DIAGNOSIS — H6123 Impacted cerumen, bilateral: Secondary | ICD-10-CM

## 2018-02-15 DIAGNOSIS — R1900 Intra-abdominal and pelvic swelling, mass and lump, unspecified site: Secondary | ICD-10-CM

## 2018-02-15 DIAGNOSIS — I10 Essential (primary) hypertension: Secondary | ICD-10-CM

## 2018-02-15 DIAGNOSIS — Z1383 Encounter for screening for respiratory disorder NEC: Secondary | ICD-10-CM

## 2018-02-15 DIAGNOSIS — N6011 Diffuse cystic mastopathy of right breast: Secondary | ICD-10-CM

## 2018-02-15 DIAGNOSIS — F5101 Primary insomnia: Secondary | ICD-10-CM

## 2018-02-15 DIAGNOSIS — Z0000 Encounter for general adult medical examination without abnormal findings: Secondary | ICD-10-CM | POA: Diagnosis not present

## 2018-02-15 DIAGNOSIS — Z136 Encounter for screening for cardiovascular disorders: Secondary | ICD-10-CM | POA: Diagnosis not present

## 2018-02-15 DIAGNOSIS — R7303 Prediabetes: Secondary | ICD-10-CM

## 2018-02-15 DIAGNOSIS — N6012 Diffuse cystic mastopathy of left breast: Secondary | ICD-10-CM

## 2018-02-15 DIAGNOSIS — Z124 Encounter for screening for malignant neoplasm of cervix: Secondary | ICD-10-CM | POA: Diagnosis not present

## 2018-02-15 DIAGNOSIS — Z113 Encounter for screening for infections with a predominantly sexual mode of transmission: Secondary | ICD-10-CM | POA: Diagnosis not present

## 2018-02-15 DIAGNOSIS — Z1231 Encounter for screening mammogram for malignant neoplasm of breast: Secondary | ICD-10-CM

## 2018-02-15 LAB — POCT URINALYSIS DIP (MANUAL ENTRY)
BILIRUBIN UA: NEGATIVE mg/dL
Bilirubin, UA: NEGATIVE
GLUCOSE UA: NEGATIVE mg/dL
LEUKOCYTES UA: NEGATIVE
NITRITE UA: NEGATIVE
Protein Ur, POC: NEGATIVE mg/dL
Spec Grav, UA: 1.015 (ref 1.010–1.025)
Urobilinogen, UA: 0.2 E.U./dL
pH, UA: 7 (ref 5.0–8.0)

## 2018-02-15 MED ORDER — TRAZODONE HCL 50 MG PO TABS: 25.0000 mg | ORAL_TABLET | Freq: Every evening | ORAL | 3 refills | Status: DC | PRN

## 2018-02-15 MED ORDER — METFORMIN HCL 500 MG PO TABS: 500.0000 mg | ORAL_TABLET | Freq: Two times a day (BID) | ORAL | 1 refills | Status: DC

## 2018-02-15 NOTE — Patient Instructions (Addendum)
   IF you received an x-ray today, you will receive an invoice from [facility]. Please contact [facility] at [phone] with questions or concerns regarding your invoice.   IF you received labwork today, you will receive an invoice from [facility]. Please contact [facility] at [phone] with questions or concerns regarding your invoice.   Our billing staff will not be able to assist you with questions regarding bills from these companies.  You will be contacted with the lab results as soon as they are available. The fastest way to get your results is to activate your My Chart account. Instructions are located on the last page of this paperwork. If you have not heard from us regarding the results in 2 weeks, please contact this office.     Keeping You Healthy  Get These Tests  Blood Pressure- Have your blood pressure checked by your healthcare provider at least once a year.  Normal blood pressure is 120/80.  Weight- Have your body mass index (BMI) calculated to screen for obesity.  BMI is a measure of body fat based on height and weight.  You can calculate your own BMI at www.nhlbisupport.com/bmi/  Cholesterol- Have your cholesterol checked every year.  Diabetes- Have your blood sugar checked every year if you have high blood pressure, high cholesterol, a family history of diabetes or if you are overweight.  Pap Test - Have a pap test every 1 to 5 years if you have been sexually active.  If you are older than 65 and recent pap tests have been normal you may not need additional pap tests.  In addition, if you have had a hysterectomy  for benign disease additional pap tests are not necessary.  Mammogram-Yearly mammograms are essential for early detection of breast cancer  Screening for Colon Cancer- Colonoscopy starting at age 50. Screening may begin sooner depending on your family history and other health conditions.  Follow up colonoscopy as directed by your  Gastroenterologist.  Screening for Osteoporosis- Screening begins at age 65 with bone density scanning, sooner if you are at higher risk for developing Osteoporosis.  Get these medicines  Calcium with Vitamin D- Your body requires 1200-1500 mg of Calcium a day and 800-1000 IU of Vitamin D a day.  You can only absorb 500 mg of Calcium at a time therefore Calcium must be taken in 2 or 3 separate doses throughout the day.  Hormones- Hormone therapy has been associated with increased risk for certain cancers and heart disease.  Talk to your healthcare provider about if you need relief from menopausal symptoms.  Aspirin- Ask your healthcare provider about taking Aspirin to prevent Heart Disease and Stroke.  Get these Immuniztions  Flu shot- Every fall  Pneumonia shot- Once after the age of 65; if you are younger ask your healthcare provider if you need a pneumonia shot.  Tetanus- Every ten years.  Zostavax- Once after the age of 60 to prevent shingles.  Take these steps  Don't smoke- Your healthcare provider can help you quit. For tips on how to quit, ask your healthcare provider or go to www.smokefree.gov or call 1-800 QUIT-NOW.  Be physically active- Exercise 5 days a week for a minimum of 30 minutes.  If you are not already physically active, start slow and gradually work up to 30 minutes of moderate physical activity.  Try walking, dancing, bike riding, swimming, etc.  Eat a healthy diet- Eat a variety of healthy foods such as fruits, vegetables, whole grains, low   fat milk, low fat cheeses, yogurt, lean meats, chicken, fish, eggs, dried beans, tofu, etc.  For more information go to www.thenutritionsource.org  Dental visit- Brush and floss teeth twice daily; visit your dentist twice a year.  Eye exam- Visit your Optometrist or Ophthalmologist yearly.  Drink alcohol in moderation- Limit alcohol intake to one drink or less a day.  Never drink and drive.  Depression- Your emotional  health is as important as your physical health.  If you're feeling down or losing interest in things you normally enjoy, please talk to your healthcare provider.  Seat Belts- can save your life; always wear one  Smoke/Carbon Monoxide detectors- These detectors need to be installed on the appropriate level of your home.  Replace batteries at least once a year.  Violence- If anyone is threatening or hurting you, please tell your healthcare provider.  Living Will/ Health care power of attorney- Discuss with your healthcare provider and family.  
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
## 2018-02-16 LAB — COMPREHENSIVE METABOLIC PANEL
A/G RATIO: 1.3 (ref 1.2–2.2)
ALK PHOS: 98 IU/L (ref 39–117)
ALT: 15 IU/L (ref 0–32)
AST: 12 IU/L (ref 0–40)
Albumin: 4 g/dL (ref 3.6–4.8)
BILIRUBIN TOTAL: 0.4 mg/dL (ref 0.0–1.2)
BUN/Creatinine Ratio: 18 (ref 12–28)
BUN: 14 mg/dL (ref 8–27)
CHLORIDE: 104 mmol/L (ref 96–106)
CO2: 23 mmol/L (ref 20–29)
Calcium: 9.3 mg/dL (ref 8.7–10.3)
Creatinine, Ser: 0.77 mg/dL (ref 0.57–1.00)
GFR calc Af Amer: 95 mL/min/{1.73_m2} (ref >59)
GFR calc non Af Amer: 82 mL/min/{1.73_m2} (ref >59)
GLOBULIN, TOTAL: 3 g/dL (ref 1.5–4.5)
Glucose: 103 mg/dL — ABNORMAL HIGH (ref 65–99)
POTASSIUM: 4 mmol/L (ref 3.5–5.2)
SODIUM: 141 mmol/L (ref 134–144)
Total Protein: 7 g/dL (ref 6.0–8.5)

## 2018-02-16 LAB — CBC WITH DIFFERENTIAL/PLATELET
Basophils Absolute: 0.1 10*3/uL (ref 0.0–0.2)
Basos: 1 %
EOS (ABSOLUTE): 0.1 10*3/uL (ref 0.0–0.4)
EOS: 3 %
HEMATOCRIT: 40.5 % (ref 34.0–46.6)
HEMOGLOBIN: 13.4 g/dL (ref 11.1–15.9)
IMMATURE GRANS (ABS): 0 10*3/uL (ref 0.0–0.1)
IMMATURE GRANULOCYTES: 0 %
LYMPHS: 51 %
Lymphocytes Absolute: 1.8 10*3/uL (ref 0.7–3.1)
MCH: 28 pg (ref 26.6–33.0)
MCHC: 33.1 g/dL (ref 31.5–35.7)
MCV: 85 fL (ref 79–97)
MONOS ABS: 0.3 10*3/uL (ref 0.1–0.9)
Monocytes: 9 %
NEUTROS PCT: 36 %
Neutrophils Absolute: 1.3 10*3/uL — ABNORMAL LOW (ref 1.4–7.0)
Platelets: 241 10*3/uL (ref 150–450)
RBC: 4.78 x10E6/uL (ref 3.77–5.28)
RDW: 15.3 % (ref 12.3–15.4)
WBC: 3.6 10*3/uL (ref 3.4–10.8)

## 2018-02-16 LAB — HEMOGLOBIN A1C
Est. average glucose Bld gHb Est-mCnc: 114 mg/dL
Hgb A1c MFr Bld: 5.6 % (ref 4.8–5.6)

## 2018-02-16 LAB — MICROALBUMIN / CREATININE URINE RATIO
Creatinine, Urine: 67.3 mg/dL
MICROALBUM., U, RANDOM: 9 ug/mL
Microalb/Creat Ratio: 13.4 mg/g creat (ref 0.0–30.0)

## 2018-02-16 LAB — LIPID PANEL
CHOL/HDL RATIO: 3.8 ratio (ref 0.0–4.4)
CHOLESTEROL TOTAL: 178 mg/dL (ref 100–199)
HDL: 47 mg/dL (ref >39)
LDL Calculated: 102 mg/dL — ABNORMAL HIGH (ref 0–99)
TRIGLYCERIDES: 143 mg/dL (ref 0–149)
VLDL Cholesterol Cal: 29 mg/dL (ref 5–40)

## 2018-02-17 ENCOUNTER — Telehealth: Payer: Self-pay

## 2018-02-17 NOTE — Telephone Encounter (Signed)
Copied from CRM [id number]. Topic: Inquiry >> Feb 17, 2018  3:33 PM Windy Kalata, NT wrote: Reason for CRM: patient is calling and states she seen Dr. Clelia Croft on 02/15/18 and she had her eye exam today 02/17/18. Dr. Norva Pavlov is going to be faxing over information states he found something in her eye that looked stroke related. Patient wanted to go ahead and inform Dr. Clelia Croft.

## 2018-02-18 ENCOUNTER — Emergency Department (HOSPITAL_COMMUNITY)
Admission: EM | Admit: 2018-02-18 | Discharge: 2018-02-18 | Disposition: A | Discharge status: Home / Self Care | Payer: BC Managed Care – PPO | Attending: Emergency Medicine | Admitting: Emergency Medicine

## 2018-02-18 ENCOUNTER — Encounter (HOSPITAL_COMMUNITY): Payer: Self-pay | Admitting: Emergency Medicine

## 2018-02-18 ENCOUNTER — Emergency Department (HOSPITAL_COMMUNITY): Payer: BC Managed Care – PPO

## 2018-02-18 DIAGNOSIS — Z0489 Encounter for examination and observation for other specified reasons: Secondary | ICD-10-CM | POA: Diagnosis present

## 2018-02-18 DIAGNOSIS — I10 Essential (primary) hypertension: Secondary | ICD-10-CM | POA: Insufficient documentation

## 2018-02-18 DIAGNOSIS — Z7984 Long term (current) use of oral hypoglycemic drugs: Secondary | ICD-10-CM | POA: Insufficient documentation

## 2018-02-18 DIAGNOSIS — Z711 Person with feared health complaint in whom no diagnosis is made: Secondary | ICD-10-CM | POA: Diagnosis not present

## 2018-02-18 DIAGNOSIS — Q159 Congenital malformation of eye, unspecified: Secondary | ICD-10-CM

## 2018-02-18 DIAGNOSIS — H579 Unspecified disorder of eye and adnexa: Secondary | ICD-10-CM | POA: Insufficient documentation

## 2018-02-18 DIAGNOSIS — Z7982 Long term (current) use of aspirin: Secondary | ICD-10-CM | POA: Insufficient documentation

## 2018-02-18 DIAGNOSIS — E119 Type 2 diabetes mellitus without complications: Secondary | ICD-10-CM | POA: Diagnosis not present

## 2018-02-18 DIAGNOSIS — Z79899 Other long term (current) drug therapy: Secondary | ICD-10-CM | POA: Insufficient documentation

## 2018-02-18 LAB — URINALYSIS, ROUTINE W REFLEX MICROSCOPIC
Bilirubin Urine: NEGATIVE
Glucose, UA: NEGATIVE mg/dL
Hgb urine dipstick: NEGATIVE
Ketones, ur: NEGATIVE mg/dL
LEUKOCYTES UA: NEGATIVE
NITRITE: NEGATIVE
PH: 5 (ref 5.0–8.0)
Protein, ur: NEGATIVE mg/dL
SPECIFIC GRAVITY, URINE: 1.019 (ref 1.005–1.030)

## 2018-02-18 LAB — DIFFERENTIAL
BASOS ABS: 0.1 10*3/uL (ref 0.0–0.1)
Basophils Relative: 1 %
EOS PCT: 2 %
Eosinophils Absolute: 0.1 10*3/uL (ref 0.0–0.7)
Lymphocytes Relative: 48 %
Lymphs Abs: 2.8 10*3/uL (ref 0.7–4.0)
MONO ABS: 0.3 10*3/uL (ref 0.1–1.0)
Monocytes Relative: 5 %
NEUTROS ABS: 2.5 10*3/uL (ref 1.7–7.7)
Neutrophils Relative %: 44 %

## 2018-02-18 LAB — I-STAT CHEM 8, ED
BUN: 17 mg/dL (ref 6–20)
CHLORIDE: 104 mmol/L (ref 101–111)
Calcium, Ion: 1.2 mmol/L (ref 1.15–1.40)
Creatinine, Ser: 0.9 mg/dL (ref 0.44–1.00)
Glucose, Bld: 76 mg/dL (ref 65–99)
HEMATOCRIT: 42 % (ref 36.0–46.0)
Hemoglobin: 14.3 g/dL (ref 12.0–15.0)
POTASSIUM: 3.7 mmol/L (ref 3.5–5.1)
SODIUM: 143 mmol/L (ref 135–145)
TCO2: 28 mmol/L (ref 22–32)

## 2018-02-18 LAB — COMPREHENSIVE METABOLIC PANEL
ALK PHOS: 84 U/L (ref 38–126)
ALT: 16 U/L (ref 14–54)
ANION GAP: 11 (ref 5–15)
AST: 26 U/L (ref 15–41)
Albumin: 3.8 g/dL (ref 3.5–5.0)
BUN: 16 mg/dL (ref 6–20)
CALCIUM: 9 mg/dL (ref 8.9–10.3)
CO2: 22 mmol/L (ref 22–32)
Chloride: 106 mmol/L (ref 101–111)
Creatinine, Ser: 0.94 mg/dL (ref 0.44–1.00)
GFR calc Af Amer: >60 mL/min (ref >60)
Glucose, Bld: 180 mg/dL — ABNORMAL HIGH (ref 65–99)
Potassium: 3.7 mmol/L (ref 3.5–5.1)
SODIUM: 139 mmol/L (ref 135–145)
TOTAL PROTEIN: 7.4 g/dL (ref 6.5–8.1)
Total Bilirubin: 0.5 mg/dL (ref 0.3–1.2)

## 2018-02-18 LAB — CBC
HCT: 38.8 % (ref 36.0–46.0)
Hemoglobin: 13.6 g/dL (ref 12.0–15.0)
MCH: 28.5 pg (ref 26.0–34.0)
MCHC: 35.1 g/dL (ref 30.0–36.0)
MCV: 81.3 fL (ref 78.0–100.0)
Platelets: 255 10*3/uL (ref 150–400)
RBC: 4.77 MIL/uL (ref 3.87–5.11)
RDW: 14.1 % (ref 11.5–15.5)
WBC: 5.8 10*3/uL (ref 4.0–10.5)

## 2018-02-18 LAB — PROTIME-INR
INR: 0.97
Prothrombin Time: 12.8 seconds (ref 11.4–15.2)

## 2018-02-18 LAB — ETHANOL

## 2018-02-18 LAB — I-STAT TROPONIN, ED: TROPONIN I, POC: 0 ng/mL (ref 0.00–0.08)

## 2018-02-18 LAB — RAPID URINE DRUG SCREEN, HOSP PERFORMED
AMPHETAMINES: NOT DETECTED
Barbiturates: NOT DETECTED
Benzodiazepines: NOT DETECTED
Cocaine: NOT DETECTED
OPIATES: NOT DETECTED
Tetrahydrocannabinol: NOT DETECTED

## 2018-02-18 LAB — APTT: APTT: 29 s (ref 24–36)

## 2018-02-18 MED ORDER — IOPAMIDOL (ISOVUE-370) INJECTION 76%
100.0000 mL | Freq: Once | INTRAVENOUS | Status: AC | PRN
  Administered 2018-02-18: 100 mL via INTRAVENOUS

## 2018-02-18 NOTE — ED Triage Notes (Signed)
Pt reports she was seen at eye doctor yesterday and was told to go to ED due to carotid blockage.

## 2018-02-18 NOTE — ED Notes (Signed)
Patient transported to CT 
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
## 2018-02-18 NOTE — Telephone Encounter (Addendum)
Incoming call from patient transferred from call center. Reviewed note from Dr. Massie Kluver faxed to Dr. Clelia Croft today - she is at risk for a stroke due to blockage in her carotid. Needs carotid ultrasound. Advised to go to ED. She is agreeable with plan, will present to [facility].   Phone call to [facility] ED spoke with Darl Pikes. Notified patient is on her way.

## 2018-02-18 NOTE — Telephone Encounter (Signed)
Dr Massie Kluver calling back wanting to make sure fax was received and Dr Clelia Croft had review. Provider did not want to wait for the long weekend. Spoke with Carollee Herter at office fax was received, Jonny Ruiz notified.

## 2018-02-18 NOTE — Discharge Instructions (Signed)
Your exam was normal 

## 2018-02-18 NOTE — Telephone Encounter (Signed)
Thanks for the heads up. Will look out for this.

## 2018-02-19 MED ORDER — ZOSTER VAC RECOMB ADJUVANTED 50 MCG/0.5ML IM SUSR: 0.5000 mL | Freq: Once | INTRAMUSCULAR | 1 refills | Status: AC

## 2018-02-19 NOTE — Telephone Encounter (Signed)
Pt went to ER - had CTA of head and neck that was completley normal so sent home. No optometry note seen still under scan. Will recheck when I am in office on Tues - 3d.

## 2018-02-22 ENCOUNTER — Ambulatory Visit
Admission: RE | Admit: 2018-02-22 | Discharge: 2018-02-22 | Disposition: A | Discharge status: Home / Self Care | Payer: BC Managed Care – PPO | Source: Ambulatory Visit | Attending: Family Medicine | Admitting: Family Medicine

## 2018-02-22 ENCOUNTER — Encounter: Payer: Self-pay | Admitting: Family Medicine

## 2018-02-22 DIAGNOSIS — Z1231 Encounter for screening mammogram for malignant neoplasm of breast: Secondary | ICD-10-CM

## 2018-02-22 LAB — PAP IG AND HPV HIGH-RISK
HPV, HIGH-RISK: NEGATIVE
PAP SMEAR COMMENT: 0

## 2018-02-23 ENCOUNTER — Telehealth: Payer: Self-pay

## 2018-02-23 NOTE — Telephone Encounter (Signed)
This is a duplicate message from 5/23 note - see communications from later on in the day on 5/24 - pt was sent to the ER and head and neck CTA were neg/normal.

## 2018-02-23 NOTE — Telephone Encounter (Signed)
Copied from CRM [id number]. Topic: Inquiry >> Feb 17, 2018  3:33 PM Katherine [name], NT wrote: Reason for CRM: patient is calling and states she seen Dr. Clelia Croft on 02/15/18 and she had her eye exam today 02/17/18. Dr. Norva Pavlov is going to be faxing over information states he found something in her eye that looked stroke related. Patient wanted to go ahead and inform Dr. Clelia Croft. >> Feb 18, 2018  9:14 AM Terisa Starr wrote: Katherine [name] from Dr Vanetta Mulders office called and wants to personally speak to a nurse about what he found. She will not be faxing over anything until then because he wants to refer her out. Please call back asap. Call back @ [phone]

## 2018-03-07 ENCOUNTER — Other Ambulatory Visit: Payer: Self-pay

## 2018-03-07 ENCOUNTER — Ambulatory Visit (INDEPENDENT_AMBULATORY_CARE_PROVIDER_SITE_OTHER): Payer: BC Managed Care – PPO

## 2018-03-07 ENCOUNTER — Ambulatory Visit: Payer: BC Managed Care – PPO | Admitting: Family Medicine

## 2018-03-07 ENCOUNTER — Telehealth: Payer: Self-pay | Admitting: Family Medicine

## 2018-03-07 ENCOUNTER — Encounter: Payer: Self-pay | Admitting: Family Medicine

## 2018-03-07 VITALS — BP 146/86 | HR 76 | Temp 98.1°F | Resp 16 | Ht 65.0 in | Wt 229.0 lb

## 2018-03-07 DIAGNOSIS — M1990 Unspecified osteoarthritis, unspecified site: Secondary | ICD-10-CM

## 2018-03-07 DIAGNOSIS — R600 Localized edema: Secondary | ICD-10-CM

## 2018-03-07 DIAGNOSIS — I10 Essential (primary) hypertension: Secondary | ICD-10-CM | POA: Diagnosis not present

## 2018-03-07 DIAGNOSIS — M25572 Pain in left ankle and joints of left foot: Secondary | ICD-10-CM

## 2018-03-07 LAB — POCT CBC
Granulocyte percent: 31.9 %G — AB (ref 37–80)
HEMATOCRIT: 44.5 % (ref 37.7–47.9)
HEMOGLOBIN: 14.3 g/dL (ref 12.2–16.2)
LYMPH, POC: 2.9 (ref 0.6–3.4)
MCH, POC: 26.8 pg — AB (ref 27–31.2)
MCHC: 32.2 g/dL (ref 31.8–35.4)
MCV: 83.3 fL (ref 80–97)
MID (cbc): 0.3 (ref 0–0.9)
MPV: 8.3 fL (ref 0–99.8)
POC GRANULOCYTE: 1.5 — AB (ref 2–6.9)
POC LYMPH %: 60.9 % — AB (ref 10–50)
POC MID %: 7.2 % (ref 0–12)
Platelet Count, POC: 272 10*3/uL (ref 142–424)
RBC: 5.34 M/uL (ref 4.04–5.48)
RDW, POC: 14.2 %
WBC: 4.7 10*3/uL (ref 4.6–10.2)

## 2018-03-07 LAB — POCT SEDIMENTATION RATE: POCT SED RATE: 22 mm/hr (ref 0–22)

## 2018-03-07 MED ORDER — PREDNISONE 20 MG PO TABS: ORAL_TABLET | ORAL | 0 refills | Status: DC

## 2018-03-07 MED ORDER — KETOROLAC TROMETHAMINE 60 MG/2ML IM SOLN
60.0000 mg | Freq: Once | INTRAMUSCULAR | Status: AC
  Administered 2018-03-07: 60 mg via INTRAMUSCULAR

## 2018-03-07 MED ORDER — FUROSEMIDE 20 MG PO TABS: 20.0000 mg | ORAL_TABLET | Freq: Every day | ORAL | 0 refills | Status: DC

## 2018-03-07 NOTE — Telephone Encounter (Signed)
Copied from CRM [id number]. Topic: Quick Communication - Rx Refill/Question >> Mar 07, 2018  3:47 PM Maia Pettiesrtiz, Kristie S wrote: Medication: pharmacy is out of prednisone 20mg  - can they substitute with the 10mg  tabs? Please advise.  [facility] [address]- [place], [place] - [id number] [phone] (Phone) [phone] (Fax)

## 2018-03-07 NOTE — Progress Notes (Signed)
Subjective:  By signing my name below, I, Katherine [name], attest that this documentation has been prepared under the direction and in the presence of Katherine SorensonEva Shaw, MD Electronically Signed: Charline BillsEssence [name], ED Scribe 03/07/2018 at 1:47 PM.   Patient ID: Katherine [name], female    DOB: [birth year], 62 y.o.   MRN: [id number]  Chief Complaint  Patient presents with  . Joint Swelling    since 02/15/18, inside left ankle hot to touch, both are swollen     HPI Katherine [name] is a 62 y.o. female who [facility] to [facility] at [facility] complaining of bilateral ankle swelling, L worse than R, x 3 wks. Pt states she has always had some swelling to the L ankle but she now has bilateral ankle swelling and burning pain with standing/ambulating. She has also noticed some redness and warmth to touch to the L ankle as well. She has tried elevating her legs, applying ice, ambulating with a cane and ambulating with bionic shoes with help some. No h/o gout.  Past Medical History:  Diagnosis Date  . Diabetes mellitus without complication (HCC)   . Hypertension    Current Outpatient Medications on File Prior to Visit  Medication Sig Dispense Refill  . amLODipine (NORVASC) 5 MG tablet TAKE 1 TABLET BY MOUTH ONCE DAILY 90 tablet 1  . aspirin EC 81 MG tablet Take 81 mg by mouth daily.    . Biotin 1000 MCG tablet Take 1,000 mcg by mouth 3 (three) times daily.    . Calcium Carb-Cholecalciferol (CALCIUM 600-D PO) Take by mouth.    . metFORMIN (GLUCOPHAGE) 500 MG tablet Take 1 tablet (500 mg total) by mouth 2 (two) times daily with a meal. 180 tablet 1  . Multiple Vitamin (MULTIVITAMIN) tablet Take 1 tablet by mouth daily.    . ranitidine (ZANTAC) 150 MG capsule Take 150 mg by mouth as needed for heartburn.    . traZODone (DESYREL) 50 MG tablet Take 0.5-1 tablets (25-50 mg total) by mouth at bedtime as needed for sleep. 30 tablet 3  . vitamin B-12 (CYANOCOBALAMIN) 1000 MCG tablet Take 1,000 mcg by mouth daily.      No current facility-administered medications on file prior to visit.    Past Surgical History:  Procedure Laterality Date  . APPENDECTOMY    . CHOLECYSTECTOMY    . rotator cuff surgery Right 07/2014   Allergies  Allergen Reactions  . Ace Inhibitors Cough  . Adhesive [Tape] Rash   Family History  Problem Relation Age of Onset  . Diabetes Father   . Hypertension Father   . Sickle cell anemia Brother   . Cancer Brother 15       Leukemia, passed away at [id number]  . Cancer Maternal Grandmother        breast cancer  . Breast cancer Neg Hx    Social History   Socioeconomic History  . Marital status: Married    Spouse name: Not on file  . Number of children: 2  . Years of education: Masters   . Highest education level: Not on file  Occupational History  . Occupation: Runner, broadcasting/film/videoTeacher  Social Needs  . Financial resource strain: Not on file  . Food insecurity:    Worry: Not on file    Inability: Not on file  . Transportation needs:    Medical: Not on file    Non-medical: Not on file  Tobacco Use  . Smoking status: Never Smoker  . Smokeless tobacco: Never Used  Substance and Sexual Activity  . Alcohol use: Yes    Alcohol/week: 0.0 oz    Comment: social  . Drug use: No  . Sexual activity: Yes  Lifestyle  . Physical activity:    Days per week: Not on file    Minutes per session: Not on file  . Stress: Not on file  Relationships  . Social connections:    Talks on phone: Not on file    Gets together: Not on file    Attends religious service: Not on file    Active member of club or organization: Not on file    Attends meetings of clubs or organizations: Not on file    Relationship status: Not on file  Other Topics Concern  . Not on file  Social History Narrative   Patient has had 2 pregnancies and has 2 living children.   Depression screen [facility] 2/9 03/07/2018 02/15/2018 12/16/2017 11/17/2017 07/17/2017  Decreased Interest 0 0 0 0 0  Down, Depressed, Hopeless 0 0 0 0 0  PHQ - 2  Score 0 0 0 0 0     Review of Systems  Musculoskeletal: Positive for arthralgias and joint swelling.  Skin: Positive for color change.      Objective:   Physical Exam  Constitutional: She is oriented to person, place, and time. She appears well-developed and well-nourished. No distress.  HENT:  Head: Normocephalic and atraumatic.  Eyes: Conjunctivae and EOM are normal.  Neck: Neck supple. No tracheal deviation present.  Cardiovascular: Normal rate.  Pulmonary/Chest: Effort normal. No respiratory distress.  Musculoskeletal: Normal range of motion.  L medial malleolus is hyperesthetic and very well-defined edema. Slight warmth.  Neurological: She is alert and oriented to person, place, and time.  Skin: Skin is warm and dry.  Psychiatric: She has a normal mood and affect. Her behavior is normal.  Nursing note and vitals reviewed.  BP (!) 152/89   Pulse 76   Temp 98.1 F (36.7 C)   Resp 16   Ht 5\' 5"  (1.651 m)   Wt 229 lb (103.9 kg)   SpO2 99%   BMI 38.11 kg/m     Results for orders placed or performed in visit on 03/07/18  POCT CBC  Result Value Ref Range   WBC 4.7 4.6 - 10.2 K/uL   Lymph, poc 2.9 0.6 - 3.4   POC LYMPH PERCENT 60.9 (A) 10 - 50 %L   MID (cbc) 0.3 0 - 0.9   POC MID % 7.2 0 - 12 %M   POC Granulocyte 1.5 (A) 2 - 6.9   Granulocyte percent 31.9 (A) 37 - 80 %G   RBC 5.34 4.04 - 5.48 M/uL   Hemoglobin 14.3 12.2 - 16.2 g/dL   HCT, POC 16.1 09.6 - 47.9 %   MCV 83.3 80 - 97 fL   MCH, POC 26.8 (A) 27 - 31.2 pg   MCHC 32.2 31.8 - 35.4 g/dL   RDW, POC 04.5 %   Platelet Count, POC 272 142 - 424 K/uL   MPV 8.3 0 - 99.8 fL   Dg Ankle Complete Left  Result Date: 03/07/2018 CLINICAL DATA:  Swelling of the medial malleolus, tenderness EXAM: LEFT ANKLE COMPLETE - 3+ VIEW COMPARISON:  None. FINDINGS: The ankle joint appears normal and alignment is normal. No fracture is seen. There does appear to be soft tissue swelling medially. Degenerative changes are noted in the  midfoot and hindfoot. IMPRESSION: No acute fracture. Soft tissue swelling over the medial  malleolus without obvious abnormality. Degenerative change in the mid and hindfoot. Electronically Signed   By: Dwyane Dee M.D.   On: 03/07/2018 14:08   Assessment & Plan:   1. Pain of joint of left ankle and foot   2. Inflammatory arthritis - Bilateral but L>R, swelling and severe burning pain when standing, Lt with mild erythema and warmth per pt, is requiring cane to ambulate. No h/o gout. - labs for cause P Xray shows swelling over left medial aspect and some OA changes Quite severe pain now so trx w/ toradol 60mg  IM x 1 now, start prednisone taper 60mg  x 6d tomorrow a.m.  Can try low dose lasix to help w/ B edema as well which may be more chronic in summer due to heat, obesity, HTN but since stopping amlodipine do not anticipate pt need to cont on lasix indefinitely - try lasix only 20mg  qdso will try to do w/o rx K supp but reviewed ways to ensure sufficient K in diet and hydrate.   3.      Essential hypertension -elevated today likely secondary to pain but now stopping amlodipine 5 due to pedal edema - use lasix 20 qd in interim and recheck in 1 wk, sooner if worse 4.      Arthritis 5.      Pedal edema - chronic but recently worsened - could be secondary to the amlodipine 5 so will hold for now and try lasix 20 - high K diet strategies reviewed to avoid need for K supp RTC 1 wk - sooner if worse.  Orders Placed This Encounter  Procedures  . DG Ankle Complete Left    Standing Status:   Future    Number of Occurrences:   1    Standing Expiration Date:   03/07/2019    Order Specific Question:   Reason for Exam (SYMPTOM  OR DIAGNOSIS REQUIRED)    Answer:   severe tenderness, antalgic gait, swelling of medical malleolus    Order Specific Question:   Preferred imaging location?    Answer:   External  . Uric A+ANA+RA Qn+CRP+ASO  . POCT CBC  . POCT SEDIMENTATION RATE    Meds ordered this encounter    Medications  . predniSONE (DELTASONE) 20 MG tablet    Sig: Take 3 tabs qd x 2d, then 2 tabs qd x 2d then 1 tab qd x 2d.    Dispense:  12 tablet    Refill:  0  . ketorolac (TORADOL) injection 60 mg  . furosemide (LASIX) 20 MG tablet    Sig: Take 1 tablet (20 mg total) by mouth daily.    Dispense:  30 tablet    Refill:  0    I personally performed the services described in this documentation, which was scribed in my presence. The recorded information has been reviewed and considered, and addended by me as needed.   Katherine Sorenson, M.D.  Primary Care at [facility] [address] [place], [place] [address] [phone] phone [phone] fax  03/14/18 2:42 AM

## 2018-03-07 NOTE — Patient Instructions (Addendum)
IF you received an x-ray today, you will receive an invoice from [facility]. Please contact [facility] at [phone] with questions or concerns regarding your invoice.   IF you received labwork today, you will receive an invoice from [facility]. Please contact [facility] at [phone] with questions or concerns regarding your invoice.   Our billing staff will not be able to assist you with questions regarding bills from these companies.  You will be contacted with the lab results as soon as they are available. The fastest way to get your results is to activate your My Chart account. Instructions are located on the last page of this paperwork. If you have not heard from [place] regarding the results in 2 weeks, please contact this office.    Potassium Content of Foods Potassium is a mineral found in many foods and drinks. It helps keep fluids and minerals balanced in your body and affects how steadily your heart beats. Potassium also helps control your blood pressure and keep your muscles and nervous system healthy. Certain health conditions and medicines may change the balance of potassium in your body. When this happens, you can help balance your level of potassium through the foods that you do or do not eat. Your health care provider or dietitian may recommend an amount of potassium that you should have each day. The following lists of foods provide the amount of potassium (in parentheses) per serving in each item. High in potassium The following foods and beverages have 200 mg or more of potassium per serving:  Apricots, 2 raw or 5 dry (200 mg).  Artichoke, 1 medium (345 mg).  Avocado, raw,  each (245 mg).  Banana, 1 medium (425 mg).  Beans, lima, or baked beans, canned,  cup (280 mg).  Beans, white, canned,  cup (595 mg).  Beef roast, 3 oz (320 mg).  Beef, ground, 3 oz (270 mg).  Beets, raw or cooked,  cup (260 mg).  Bran muffin, 2 oz (300  mg).  Broccoli,  cup (230 mg).  Brussels sprouts,  cup (250 mg).  Cantaloupe,  cup (215 mg).  Cereal, 100% bran,  cup (200-400 mg).  Cheeseburger, single, fast food, 1 each (225-400 mg).  Chicken, 3 oz (220 mg).  Clams, canned, 3 oz (535 mg).  Crab, 3 oz (225 mg).  Dates, 5 each (270 mg).  Dried beans and peas,  cup (300-475 mg).  Figs, dried, 2 each (260 mg).  Fish: halibut, tuna, cod, snapper, 3 oz (480 mg).  Fish: salmon, haddock, swordfish, perch, 3 oz (300 mg).  Fish, tuna, canned 3 oz (200 mg).  [place] fries, fast food, 3 oz (470 mg).  Granola with fruit and nuts,  cup (200 mg).  Grapefruit juice,  cup (200 mg).  Greens, beet,  cup (655 mg).  Honeydew melon,  cup (200 mg).  Kale, raw, 1 cup (300 mg).  Kiwi, 1 medium (240 mg).  Kohlrabi, rutabaga, parsnips,  cup (280 mg).  Lentils,  cup (365 mg).  Mango, 1 each (325 mg).  Milk, chocolate, 1 cup (420 mg).  Milk: nonfat, low-fat, whole, buttermilk, 1 cup (350-380 mg).  Molasses, 1 Tbsp (295 mg).  Mushrooms,  cup (280) mg.  Nectarine, 1 each (275 mg).  Nuts: almonds, peanuts, hazelnuts, [place], cashew, mixed, 1 oz (200 mg).  Nuts, pistachios, 1 oz (295 mg).  Orange, 1 each (240 mg).  Orange juice,  cup (235 mg).  Papaya, medium,  fruit (390 mg).  Peanut butter,  chunky, 2 Tbsp (240 mg).  Peanut butter, smooth, 2 Tbsp (210 mg).  Pear, 1 medium (200 mg).  Pomegranate, 1 whole (400 mg).  Pomegranate juice,  cup (215 mg).  Pork, 3 oz (350 mg).  Potato chips, salted, 1 oz (465 mg).  Potato, baked with skin, 1 medium (925 mg).  Potatoes, boiled,  cup (255 mg).  Potatoes, mashed,  cup (330 mg).  Prune juice,  cup (370 mg).  Prunes, 5 each (305 mg).  Pudding, chocolate,  cup (230 mg).  Pumpkin, canned,  cup (250 mg).  Raisins, seedless,  cup (270 mg).  Seeds, sunflower or pumpkin, 1 oz (240 mg).  Soy milk, 1 cup (300 mg).  Spinach,  cup (420  mg).  Spinach, canned,  cup (370 mg).  Sweet potato, baked with skin, 1 medium (450 mg).  Swiss chard,  cup (480 mg).  Tomato or vegetable juice,  cup (275 mg).  Tomato sauce or puree,  cup (400-550 mg).  Tomato, raw, 1 medium (290 mg).  Tomatoes, canned,  cup (200-300 mg).  [place], 3 oz (250 mg).  Wheat germ, 1 oz (250 mg).  Winter squash,  cup (250 mg).  Yogurt, plain or fruited, 6 oz (260-435 mg).  Zucchini,  cup (220 mg).  Moderate in potassium The following foods and beverages have 50-200 mg of potassium per serving:  Apple, 1 each (150 mg).  Apple juice,  cup (150 mg).  Applesauce,  cup (90 mg).  Apricot nectar,  cup (140 mg).  Asparagus, small spears,  cup or 6 spears (155 mg).  Bagel, cinnamon raisin, 1 each (130 mg).  Bagel, egg or plain, 4 in., 1 each (70 mg).  Beans, green,  cup (90 mg).  Beans, yellow,  cup (190 mg).  Beer, regular, 12 oz (100 mg).  Beets, canned,  cup (125 mg).  Blackberries,  cup (115 mg).  Blueberries,  cup (60 mg).  Bread, whole wheat, 1 slice (70 mg).  Broccoli, raw,  cup (145 mg).  Cabbage,  cup (150 mg).  Carrots, cooked or raw,  cup (180 mg).  Cauliflower, raw,  cup (150 mg).  Celery, raw,  cup (155 mg).  Cereal, bran flakes, cup (120-150 mg).  Cheese, cottage,  cup (110 mg).  Cherries, 10 each (150 mg).  Chocolate, 1 oz bar (165 mg).  Coffee, brewed 6 oz (90 mg).  Corn,  cup or 1 ear (195 mg).  Cucumbers,  cup (80 mg).  Egg, large, 1 each (60 mg).  Eggplant,  cup (60 mg).  Endive, raw, cup (80 mg).  English muffin, 1 each (65 mg).  Fish, orange roughy, 3 oz (150 mg).  Frankfurter, beef or pork, 1 each (75 mg).  Fruit cocktail,  cup (115 mg).  Grape juice,  cup (170 mg).  Grapefruit,  fruit (175 mg).  Grapes,  cup (155 mg).  Greens: kale, turnip, collard,  cup (110-150 mg).  Ice cream or frozen yogurt, chocolate,  cup (175 mg).  Ice cream or  frozen yogurt, vanilla,  cup (120-150 mg).  Lemons, limes, 1 each (80 mg).  Lettuce, all types, 1 cup (100 mg).  Mixed vegetables,  cup (150 mg).  Mushrooms, raw,  cup (110 mg).  Nuts: walnuts, pecans, or macadamia, 1 oz (125 mg).  Oatmeal,  cup (80 mg).  Okra,  cup (110 mg).  Onions, raw,  cup (120 mg).  Peach, 1 each (185 mg).  Peaches, canned,  cup (120 mg).  Pears, canned,  cup (120  mg).  Peas, green, frozen,  cup (90 mg).  Peppers, green,  cup (130 mg).  Peppers, red,  cup (160 mg).  Pineapple juice,  cup (165 mg).  Pineapple, fresh or canned,  cup (100 mg).  Plums, 1 each (105 mg).  Pudding, vanilla,  cup (150 mg).  Raspberries,  cup (90 mg).  Rhubarb,  cup (115 mg).  Rice, wild,  cup (80 mg).  Shrimp, 3 oz (155 mg).  Spinach, raw, 1 cup (170 mg).  Strawberries,  cup (125 mg).  Summer squash  cup (175-200 mg).  Swiss chard, raw, 1 cup (135 mg).  Tangerines, 1 each (140 mg).  Tea, brewed, 6 oz (65 mg).  Turnips,  cup (140 mg).  Watermelon,  cup (85 mg).  Wine, red, table, 5 oz (180 mg).  Wine, white, table, 5 oz (100 mg).  Low in potassium The following foods and beverages have less than 50 mg of potassium per serving.  Bread, white, 1 slice (30 mg).  Carbonated beverages, 12 oz (less than 5 mg).  Cheese, 1 oz (20-30 mg).  Cranberries,  cup (45 mg).  Cranberry juice cocktail,  cup (20 mg).  Fats and oils, 1 Tbsp (less than 5 mg).  Hummus, 1 Tbsp (32 mg).  Nectar: papaya, mango, or pear,  cup (35 mg).  Rice, white or brown,  cup (50 mg).  Spaghetti or macaroni,  cup cooked (30 mg).  Tortilla, flour or corn, 1 each (50 mg).  Waffle, 4 in., 1 each (50 mg).  Water chestnuts,  cup (40 mg).  This information is not intended to replace advice given to you by your health care provider. Make sure you discuss any questions you have with your health care provider. Document Released: 04/28/2005  Document Revised: 02/20/2016 Document Reviewed: 08/11/2013 Elsevier Interactive Patient Education  2018 [employer].   Low-Purine Diet Purines are compounds that affect the level of uric acid in your body. A low-purine diet is a diet that is low in purines. Eating a low-purine diet can prevent the level of uric acid in your body from getting too high and causing gout or kidney stones or both. What do I need to know about this diet?  Choose low-purine foods. Examples of low-purine foods are listed in the next section.  Drink plenty of fluids, especially water. Fluids can help remove uric acid from your body. Try to drink 8-16 cups (1.9-3.8 L) a day.  Limit foods high in fat, especially saturated fat, as fat makes it harder for the body to get rid of uric acid. Foods high in saturated fat include pizza, cheese, ice cream, whole milk, fried foods, and gravies. Choose foods that are lower in fat and lean sources of protein. Use olive oil when cooking as it contains healthy fats that are not high in saturated fat.  Limit alcohol. Alcohol interferes with the elimination of uric acid from your body. If you are having a gout attack, avoid all alcohol.  Keep in mind that different people's bodies react differently to different foods. You will probably learn over time which foods do or do not affect you. If you discover that a food tends to cause your gout to flare up, avoid eating that food. You can more freely enjoy foods that do not cause problems. If you have any questions about a food item, talk to your dietitian or health care provider. Which foods are low, moderate, and high in purines? The following is a list of foods that  are low, moderate, and high in purines. You can eat any amount of the foods that are low in purines. You may be able to have small amounts of foods that are moderate in purines. Ask your health care provider how much of a food moderate in purines you can have. Avoid foods high in  purines. Grains  Foods low in purines: Enriched white bread, pasta, rice, cake, cornbread, popcorn.  Foods moderate in purines: Whole-grain breads and cereals, wheat germ, bran, oatmeal. Uncooked oatmeal. Dry wheat bran or wheat germ.  Foods high in purines: Pancakes, [place] toast, biscuits, muffins. Vegetables  Foods low in purines: All vegetables, except those that are moderate in purines.  Foods moderate in purines: Asparagus, cauliflower, spinach, mushrooms, green peas. Fruits  All fruits are low in purines. Meats and other Protein Foods  Foods low in purines: Eggs, nuts, peanut butter.  Foods moderate in purines: 80-90% lean beef, lamb, veal, pork, poultry, fish, eggs, peanut butter, nuts. Crab, lobster, oysters, and shrimp. Cooked dried beans, peas, and lentils.  Foods high in purines: Anchovies, sardines, herring, mussels, tuna, codfish, scallops, trout, and haddock. Tomasa BlaseBacon. Organ meats (such as liver or kidney). Tripe. Game meat. Goose. Sweetbreads. Dairy  All dairy foods are low in purines. Low-fat and fat-free dairy products are best because they are low in saturated fat. Beverages  Drinks low in purines: Water, carbonated beverages, tea, coffee, cocoa.  Drinks moderate in purines: Soft drinks and other drinks sweetened with high-fructose corn syrup. Juices. To find whether a food or drink is sweetened with high-fructose corn syrup, look at the ingredients list.  Drinks high in purines: Alcoholic beverages (such as beer). Condiments  Foods low in purines: Salt, herbs, olives, pickles, relishes, vinegar.  Foods moderate in purines: Butter, margarine, oils, mayonnaise. Fats and Oils  Foods low in purines: All types, except gravies and sauces made with meat.  Foods high in purines: Gravies and sauces made with meat. Other Foods  Foods low in purines: Sugars, sweets, gelatin. Cake. Soups made without meat.  Foods moderate in purines: Meat-based or fish-based soups,  broths, or bouillons. Foods and drinks sweetened with high-fructose corn syrup.  Foods high in purines: High-fat desserts (such as ice cream, cookies, cakes, pies, doughnuts, and chocolate). Contact your dietitian for more information on foods that are not listed here. This information is not intended to replace advice given to you by your health care provider. Make sure you discuss any questions you have with your health care provider. Document Released: 01/09/2011 Document Revised: 02/20/2016 Document Reviewed: 08/21/2013 Elsevier Interactive Patient Education  2017 [employer]

## 2018-03-08 LAB — URIC A+ANA+RA QN+CRP+ASO
ASO: 68 [IU]/mL (ref 0.0–200.0)
Anti Nuclear Antibody(ANA): NEGATIVE
CRP: 8.8 mg/L — AB (ref 0.0–4.9)
Uric Acid: 5 mg/dL (ref 2.5–7.1)

## 2018-03-08 NOTE — Telephone Encounter (Signed)
[facility] Along with the pt, we called and [facility]ris [facility] and have the Rx transferred. Nothing further needed

## 2018-03-08 NOTE — Telephone Encounter (Signed)
Pharmacy advised  

## 2018-03-08 NOTE — Telephone Encounter (Signed)
Pharmacy wants to substitute 10 mg tablets for 20 mg- they are out of stock.

## 2018-03-14 ENCOUNTER — Other Ambulatory Visit: Payer: Self-pay

## 2018-03-14 ENCOUNTER — Ambulatory Visit: Payer: BC Managed Care – PPO | Admitting: Family Medicine

## 2018-03-14 ENCOUNTER — Encounter: Payer: Self-pay | Admitting: Family Medicine

## 2018-03-14 VITALS — BP 132/79 | HR 72 | Temp 97.9°F | Ht 65.0 in | Wt 232.0 lb

## 2018-03-14 DIAGNOSIS — M1990 Unspecified osteoarthritis, unspecified site: Secondary | ICD-10-CM

## 2018-03-14 DIAGNOSIS — I10 Essential (primary) hypertension: Secondary | ICD-10-CM

## 2018-03-14 DIAGNOSIS — R600 Localized edema: Secondary | ICD-10-CM

## 2018-03-14 DIAGNOSIS — M25572 Pain in left ankle and joints of left foot: Secondary | ICD-10-CM

## 2018-03-14 MED ORDER — DICLOFENAC SODIUM 75 MG PO TBEC: 75.0000 mg | DELAYED_RELEASE_TABLET | Freq: Two times a day (BID) | ORAL | 0 refills | Status: DC

## 2018-03-14 NOTE — Patient Instructions (Addendum)
     IF you received an x-ray today, you will receive an invoice from [facility]. Please contact [facility] at [phone] with questions or concerns regarding your invoice.   IF you received labwork today, you will receive an invoice from [facility]. Please contact [facility] at [phone] with questions or concerns regarding your invoice.   Our billing staff will not be able to assist you with questions regarding bills from these companies.  You will be contacted with the lab results as soon as they are available. The fastest way to get your results is to activate your My Chart account. Instructions are located on the last page of this paperwork. If you have not heard from us regarding the results in 2 weeks, please contact this office.    Joint Pain Joint pain, which is also called arthralgia, can be caused by many things. Joint pain often goes away when you follow your health care provider's instructions for relieving pain at home. However, joint pain can also be caused by conditions that require further treatment. Common causes of joint pain include:  Bruising in the area of the joint.  Overuse of the joint.  Wear and tear on the joints that occur with aging (osteoarthritis).  Various other forms of arthritis.  A buildup of a crystal form of uric acid in the joint (gout).  Infections of the joint (septic arthritis) or of the bone (osteomyelitis).  Your health care provider may recommend medicine to help with the pain. If your joint pain continues, additional tests may be needed to diagnose your condition. Follow these instructions at home: Watch your condition for any changes. Follow these instructions as directed to lessen the pain that you are feeling.  Take medicines only as directed by your health care provider.  Rest the affected area for as long as your health care provider says that you should. If directed to do so, raise the painful joint above the  level of your heart while you are sitting or lying down.  Do not do things that cause or worsen pain.  If directed, apply ice to the painful area: ? Put ice in a plastic bag. ? Place a towel between your skin and the bag. ? Leave the ice on for 20 minutes, 2-3 times per day.  Wear an elastic bandage, splint, or sling as directed by your health care provider. Loosen the elastic bandage or splint if your fingers or toes become numb and tingle, or if they turn cold and blue.  Begin exercising or stretching the affected area as directed by your health care provider. Ask your health care provider what types of exercise are safe for you.  Keep all follow-up visits as directed by your health care provider. This is important.  Contact a health care provider if:  Your pain increases, and medicine does not help.  Your joint pain does not improve within 3 days.  You have increased bruising or swelling.  You have a fever.  You lose 10 lb (4.5 kg) or more without trying. Get help right away if:  You are not able to move the joint.  Your fingers or toes become numb or they turn cold and blue. This information is not intended to replace advice given to you by your health care provider. Make sure you discuss any questions you have with your health care provider. Document Released: 09/14/2005 Document Revised: 02/14/2016 Document Reviewed: 06/26/2014 Elsevier Interactive Patient Education  2018 [employer]vier Inc.  

## 2018-03-14 NOTE — Progress Notes (Signed)
Subjective:  By signing my name below, I, [name], attest that this documentation has been prepared under the direction and in the presence of [name], MD Electronically Signed: Charline Bills, ED Scribe 03/14/2018 at 12:14 PM.   Patient ID: Katherine [name], female    DOB: June 16, 1956, 62 y.o.   MRN: [id number]  Chief Complaint  Patient presents with  . Ankle Pain    left side ankle pain 1 week follow up    HPI Katherine [name] is a 62 y.o. female who presents to Primary Care at [facility] for f/u on L ankle pain and swelling. She has been elevating her foot nightly and wearing her husband's cam walker, but she reports that she is still having tenderness to the L medial ankle with swelling, warmth and increased redness. Reports that pain seems to have improved minimally since last OV 1 wk ago. She has also been taking Lasix 20 mg qd without side-effects other than urinary frequency.  Past Medical History:  Diagnosis Date  . Diabetes mellitus without complication (HCC)   . Hypertension    Current Outpatient Medications on File Prior to Visit  Medication Sig Dispense Refill  . aspirin EC 81 MG tablet Take 81 mg by mouth daily.    . Biotin 1000 MCG tablet Take 1,000 mcg by mouth 3 (three) times daily.    . Calcium Carb-Cholecalciferol (CALCIUM 600-D PO) Take by mouth.    . furosemide (LASIX) 20 MG tablet Take 1 tablet (20 mg total) by mouth daily. 30 tablet 0  . metFORMIN (GLUCOPHAGE) 500 MG tablet Take 1 tablet (500 mg total) by mouth 2 (two) times daily with a meal. 180 tablet 1  . Multiple Vitamin (MULTIVITAMIN) tablet Take 1 tablet by mouth daily.    . ranitidine (ZANTAC) 150 MG capsule Take 150 mg by mouth as needed for heartburn.    . traZODone (DESYREL) 50 MG tablet Take 0.5-1 tablets (25-50 mg total) by mouth at bedtime as needed for sleep. 30 tablet 3  . vitamin B-12 (CYANOCOBALAMIN) 1000 MCG tablet Take 1,000 mcg by mouth daily.     No current facility-administered  medications on file prior to visit.    Past Surgical History:  Procedure Laterality Date  . APPENDECTOMY    . CHOLECYSTECTOMY    . rotator cuff surgery Right 07/2014   Allergies  Allergen Reactions  . Ace Inhibitors Cough  . Adhesive [Tape] Rash   Family History  Problem Relation Age of Onset  . Diabetes Father   . Hypertension Father   . Sickle cell anemia Brother   . Cancer Brother 15       Leukemia, passed away at 27  . Cancer Maternal Grandmother        breast cancer  . Breast cancer Neg Hx    Social History   Socioeconomic History  . Marital status: Married    Spouse name: Not on file  . Number of children: 2  . Years of education: Masters   . Highest education level: Not on file  Occupational History  . Occupation: Runner, broadcasting/film/video  Social Needs  . Financial resource strain: Not on file  . Food insecurity:    Worry: Not on file    Inability: Not on file  . Transportation needs:    Medical: Not on file    Non-medical: Not on file  Tobacco Use  . Smoking status: Never Smoker  . Smokeless tobacco: Never Used  Substance and Sexual Activity  .  Alcohol use: Yes    Alcohol/week: 0.0 standard drinks    Comment: social  . Drug use: No  . Sexual activity: Yes  Lifestyle  . Physical activity:    Days per week: Not on file    Minutes per session: Not on file  . Stress: Not on file  Relationships  . Social connections:    Talks on phone: Not on file    Gets together: Not on file    Attends religious service: Not on file    Active member of club or organization: Not on file    Attends meetings of clubs or organizations: Not on file    Relationship status: Not on file  Other Topics Concern  . Not on file  Social History Narrative   Patient has had 2 pregnancies and has 2 living children.   Depression screen [facility] 2/9 06/20/2018 03/14/2018 03/07/2018 02/15/2018 12/16/2017  Decreased Interest 0 0 0 0 0  Down, Depressed, Hopeless 0 0 0 0 0  PHQ - 2 Score 0 0 0 0 0      Review of Systems  Musculoskeletal: Positive for arthralgias and joint swelling.  Skin: Positive for color change.      Objective:   Physical Exam  Constitutional: She is oriented to person, place, and time. She appears well-developed and well-nourished. No distress.  HENT:  Head: Normocephalic and atraumatic.  Eyes: Conjunctivae and EOM are normal.  Neck: Neck supple. No tracheal deviation present.  Cardiovascular: Normal rate.  Pulmonary/Chest: Effort normal. No respiratory distress.  Musculoskeletal: Normal range of motion.  Neurological: She is alert and oriented to person, place, and time.  Skin: Skin is warm and dry.  Psychiatric: She has a normal mood and affect. Her behavior is normal.  Nursing note and vitals reviewed.  BP 132/79 (BP Location: Left Arm, Patient Position: Sitting, Cuff Size: Large)   Pulse 72   Temp 97.9 F (36.6 C) (Oral)   Ht 5\' 5"  (1.651 m)   Wt 232 lb (105.2 kg)   SpO2 99%   BMI 38.61 kg/m    Assessment & Plan:   1. Essential hypertension   2. Pain of joint of left ankle and foot   3. Arthritis   4. Pedal edema     Meds ordered this encounter  Medications  . diclofenac (VOLTAREN) 75 MG EC tablet    Sig: Take 1 tablet (75 mg total) by mouth 2 (two) times daily.    Dispense:  60 tablet    Refill:  0    I personally performed the services described in this documentation, which was scribed in my presence. The recorded information has been reviewed and considered, and addended by me as needed.   Norberto SorensonEva Shaw, M.D.  [facility]  [place] [address] [place], [place] [address] [phone] phone [phone] fax  06/20/18 10:27 AM

## 2018-03-16 ENCOUNTER — Ambulatory Visit
Admission: RE | Admit: 2018-03-16 | Discharge: 2018-03-16 | Disposition: A | Discharge status: Home / Self Care | Payer: BC Managed Care – PPO | Source: Ambulatory Visit | Attending: Family Medicine | Admitting: Family Medicine

## 2018-03-16 DIAGNOSIS — R1900 Intra-abdominal and pelvic swelling, mass and lump, unspecified site: Secondary | ICD-10-CM

## 2018-06-07 ENCOUNTER — Telehealth: Payer: Self-pay | Admitting: Family Medicine

## 2018-06-07 NOTE — Telephone Encounter (Signed)
Called pt to reschedule visit with Dr. Shaw. Rescheduled w/pt.  °

## 2018-06-20 ENCOUNTER — Ambulatory Visit: Payer: BC Managed Care – PPO | Admitting: Family Medicine

## 2018-06-20 ENCOUNTER — Ambulatory Visit: Payer: BC Managed Care – PPO | Admitting: Emergency Medicine

## 2018-06-20 ENCOUNTER — Encounter: Payer: Self-pay | Admitting: Emergency Medicine

## 2018-06-20 ENCOUNTER — Other Ambulatory Visit: Payer: Self-pay

## 2018-06-20 VITALS — BP 154/92 | HR 87 | Temp 98.7°F | Resp 16 | Ht 65.0 in | Wt 234.0 lb

## 2018-06-20 DIAGNOSIS — I10 Essential (primary) hypertension: Secondary | ICD-10-CM | POA: Diagnosis not present

## 2018-06-20 DIAGNOSIS — R7303 Prediabetes: Secondary | ICD-10-CM | POA: Diagnosis not present

## 2018-06-20 DIAGNOSIS — M25572 Pain in left ankle and joints of left foot: Secondary | ICD-10-CM | POA: Insufficient documentation

## 2018-06-20 DIAGNOSIS — Z23 Encounter for immunization: Secondary | ICD-10-CM | POA: Diagnosis not present

## 2018-06-20 LAB — GLUCOSE, POCT (MANUAL RESULT ENTRY): POC Glucose: 108 mg/dl — AB (ref 70–99)

## 2018-06-20 LAB — POCT GLYCOSYLATED HEMOGLOBIN (HGB A1C): HEMOGLOBIN A1C: 6 % — AB (ref 4.0–5.6)

## 2018-06-20 MED ORDER — INDOMETHACIN 50 MG PO CAPS: 50.0000 mg | ORAL_CAPSULE | Freq: Three times a day (TID) | ORAL | 1 refills | Status: AC | PRN

## 2018-06-20 MED ORDER — AMLODIPINE BESYLATE 5 MG PO TABS: 5.0000 mg | ORAL_TABLET | Freq: Every day | ORAL | 3 refills | Status: DC

## 2018-06-20 MED ORDER — BLOOD GLUCOSE MONITOR KIT: PACK | 0 refills | Status: AC

## 2018-06-20 NOTE — Assessment & Plan Note (Signed)
Uncontrolled hypertension.  Systolic blood pressure at home consistently over 140.  Will restart amlodipine 5 mg daily.

## 2018-06-20 NOTE — Assessment & Plan Note (Signed)
Hemoglobin A1c at 6.0 today.  Will continue metformin twice a day and encouraged to follow diabetic diet.

## 2018-06-20 NOTE — Patient Instructions (Addendum)
   If you have lab work done today you will be contacted with your lab results within the next 2 weeks.  If you have not heard from us then please contact us. The fastest way to get your results is to register for My Chart.   IF you received an x-ray today, you will receive an invoice from [facility]. Please contact [facility] at [phone] with questions or concerns regarding your invoice.   IF you received labwork today, you will receive an invoice from [facility]. Please contact [facility] at [phone] with questions or concerns regarding your invoice.   Our billing staff will not be able to assist you with questions regarding bills from these companies.  You will be contacted with the lab results as soon as they are available. The fastest way to get your results is to activate your My Chart account. Instructions are located on the last page of this paperwork. If you have not heard from us regarding the results in 2 weeks, please contact this office.     Gout Gout is painful swelling that can happen in some of your joints. Gout is a type of arthritis. This condition is caused by having too much uric acid in your body. Uric acid is a chemical that is made when your body breaks down substances called purines. If your body has too much uric acid, sharp crystals can form and build up in your joints. This causes pain and swelling. Gout attacks can happen quickly and be very painful (acute gout). Over time, the attacks can affect more joints and happen more often (chronic gout). Follow these instructions at home: During a Gout Attack  If directed, put ice on the painful area: ? Put ice in a plastic bag. ? Place a towel between your skin and the bag. ? Leave the ice on for 20 minutes, 2-3 times a day.  Rest the joint as much as possible. If the joint is in your leg, you may be given crutches to use.  Raise (elevate) the painful joint above the level of your heart as  often as you can.  Drink enough fluids to keep your pee (urine) clear or pale yellow.  Take over-the-counter and prescription medicines only as told by your doctor.  Do not drive or use heavy machinery while taking prescription pain medicine.  Follow instructions from your doctor about what you can or cannot eat and drink.  Return to your normal activities as told by your doctor. Ask your doctor what activities are safe for you. Avoiding Future Gout Attacks  Follow a low-purine diet as told by a specialist (dietitian) or your doctor. Avoid foods and drinks that have a lot of purines, such as: ? Liver. ? Kidney. ? Anchovies. ? Asparagus. ? Herring. ? Mushrooms ? Mussels. ? Beer.  Limit alcohol intake to no more than 1 drink a day for nonpregnant women and 2 drinks a day for men. One drink equals 12 oz of beer, 5 oz of wine, or 1 oz of hard liquor.  Stay at a healthy weight or lose weight if you are overweight. If you want to lose weight, talk with your doctor. It is important that you do not lose weight too fast.  Start or continue an exercise plan as told by your doctor.  Drink enough fluids to keep your pee clear or pale yellow.  Take over-the-counter and prescription medicines only as told by your doctor.  Keep all follow-up visits as told   by your doctor. This is important. Contact a doctor if:  You have another gout attack.  You still have symptoms of a gout attack after10 days of treatment.  You have problems (side effects) because of your medicines.  You have chills or a fever.  You have burning pain when you pee (urinate).  You have pain in your lower back or belly. Get help right away if:  You have very bad pain.  Your pain cannot be controlled.  You cannot pee. This information is not intended to replace advice given to you by your health care provider. Make sure you discuss any questions you have with your health care provider. Document Released:  06/23/2008 Document Revised: 02/20/2016 Document Reviewed: 06/27/2015 Elsevier Interactive Patient Education  2018 [employer].  Hypertension Hypertension, commonly called high blood pressure, is when the force of blood pumping through the arteries is too strong. The arteries are the blood vessels that carry blood from the heart throughout the body. Hypertension forces the heart to work harder to pump blood and may cause arteries to become narrow or stiff. Having untreated or uncontrolled hypertension can cause heart attacks, strokes, kidney disease, and other problems. A blood pressure reading consists of a higher number over a lower number. Ideally, your blood pressure should be below 120/80. The first ("top") number is called the systolic pressure. It is a measure of the pressure in your arteries as your heart beats. The second ("bottom") number is called the diastolic pressure. It is a measure of the pressure in your arteries as the heart relaxes. What are the causes? The cause of this condition is not known. What increases the risk? Some risk factors for high blood pressure are under your control. Others are not. Factors you can change  Smoking.  Having type 2 diabetes mellitus, high cholesterol, or both.  Not getting enough exercise or physical activity.  Being overweight.  Having too much fat, sugar, calories, or salt (sodium) in your diet.  Drinking too much alcohol. Factors that are difficult or impossible to change  Having chronic kidney disease.  Having a family history of high blood pressure.  Age. Risk increases with age.  Race. You may be at higher risk if you are African-American.  Gender. Men are at higher risk than women before age 21. After age 40, women are at higher risk than men.  Having obstructive sleep apnea.  Stress. What are the signs or symptoms? Extremely high blood pressure (hypertensive crisis) may cause:  Headache.  Anxiety.  Shortness of  breath.  Nosebleed.  Nausea and vomiting.  Severe chest pain.  Jerky movements you cannot control (seizures).  How is this diagnosed? This condition is diagnosed by measuring your blood pressure while you are seated, with your arm resting on a surface. The cuff of the blood pressure monitor will be placed directly against the skin of your upper arm at the level of your heart. It should be measured at least twice using the same arm. Certain conditions can cause a difference in blood pressure between your right and left arms. Certain factors can cause blood pressure readings to be lower or higher than normal (elevated) for a short period of time:  When your blood pressure is higher when you are in a health care provider's office than when you are at home, this is called white coat hypertension. Most people with this condition do not need medicines.  When your blood pressure is higher at home than when you are  in a health care provider's office, this is called masked hypertension. Most people with this condition may need medicines to control blood pressure.  If you have a high blood pressure reading during one visit or you have normal blood pressure with other risk factors:  You may be asked to return on a different day to have your blood pressure checked again.  You may be asked to monitor your blood pressure at home for 1 week or longer.  If you are diagnosed with hypertension, you may have other blood or imaging tests to help your health care provider understand your overall risk for other conditions. How is this treated? This condition is treated by making healthy lifestyle changes, such as eating healthy foods, exercising more, and reducing your alcohol intake. Your health care provider may prescribe medicine if lifestyle changes are not enough to get your blood pressure under control, and if:  Your systolic blood pressure is above 130.  Your diastolic blood pressure is above  80.  Your personal target blood pressure may vary depending on your medical conditions, your age, and other factors. Follow these instructions at home: Eating and drinking  Eat a diet that is high in fiber and potassium, and low in sodium, added sugar, and fat. An example eating plan is called the DASH (Dietary Approaches to Stop Hypertension) diet. To eat this way: ? Eat plenty of fresh fruits and vegetables. Try to fill half of your plate at each meal with fruits and vegetables. ? Eat whole grains, such as whole wheat pasta, brown rice, or whole grain bread. Fill about one quarter of your plate with whole grains. ? Eat or drink low-fat dairy products, such as skim milk or low-fat yogurt. ? Avoid fatty cuts of meat, processed or cured meats, and poultry with skin. Fill about one quarter of your plate with lean proteins, such as fish, chicken without skin, beans, eggs, and tofu. ? Avoid premade and processed foods. These tend to be higher in sodium, added sugar, and fat.  Reduce your daily sodium intake. Most people with hypertension should eat less than 1,500 mg of sodium a day.  Limit alcohol intake to no more than 1 drink a day for nonpregnant women and 2 drinks a day for men. One drink equals 12 oz of beer, 5 oz of wine, or 1 oz of hard liquor. Lifestyle  Work with your health care provider to maintain a healthy body weight or to lose weight. Ask what an ideal weight is for you.  Get at least 30 minutes of exercise that causes your heart to beat faster (aerobic exercise) most days of the week. Activities may include walking, swimming, or biking.  Include exercise to strengthen your muscles (resistance exercise), such as pilates or lifting weights, as part of your weekly exercise routine. Try to do these types of exercises for 30 minutes at least 3 days a week.  Do not use any products that contain nicotine or tobacco, such as cigarettes and e-cigarettes. If you need help quitting, ask  your health care provider.  Monitor your blood pressure at home as told by your health care provider.  Keep all follow-up visits as told by your health care provider. This is important. Medicines  Take over-the-counter and prescription medicines only as told by your health care provider. Follow directions carefully. Blood pressure medicines must be taken as prescribed.  Do not skip doses of blood pressure medicine. Doing this puts you at risk for problems and can  make the medicine less effective.  Ask your health care provider about side effects or reactions to medicines that you should watch for. Contact a health care provider if:  You think you are having a reaction to a medicine you are taking.  You have headaches that keep coming back (recurring).  You feel dizzy.  You have swelling in your ankles.  You have trouble with your vision. Get help right away if:  You develop a severe headache or confusion.  You have unusual weakness or numbness.  You feel faint.  You have severe pain in your chest or abdomen.  You vomit repeatedly.  You have trouble breathing. Summary  Hypertension is when the force of blood pumping through your arteries is too strong. If this condition is not controlled, it may put you at risk for serious complications.  Your personal target blood pressure may vary depending on your medical conditions, your age, and other factors. For most people, a normal blood pressure is less than 120/80.  Hypertension is treated with lifestyle changes, medicines, or a combination of both. Lifestyle changes include weight loss, eating a healthy, low-sodium diet, exercising more, and limiting alcohol. This information is not intended to replace advice given to you by your health care provider. Make sure you discuss any questions you have with your health care provider. Document Released: 09/14/2005 Document Revised: 08/12/2016 Document Reviewed: 08/12/2016 Elsevier  Interactive Patient Education  [employer].

## 2018-06-20 NOTE — Progress Notes (Signed)
[name] 62 y.o.   Chief Complaint  Patient presents with  . Diabetes    follow up  . Ankle Pain    left since OV 03/14/2018   Lab Results  Component Value Date   HGBA1C 5.6 02/15/2018    BP Readings from Last 3 Encounters:  06/20/18 (!) 167/80  03/14/18 132/79  03/07/18 (!) 146/86     HISTORY OF PRESENT ILLNESS: This is a 63 y.o. female complaining of pain and swelling to left medial ankle since last June.  X-ray done then only showed soft tissue swelling but no bony abnormality.  Uric acid then was normal.  Patient thinks this is a gout.  No history of gout.  Her diet includes shellfish, meat, and wine.  Denies any other significant symptoms.  No history of injuries. Previous records and results reviewed by me. Also here to check her prediabetes and essential hypertension.  Off blood pressure medication.  Takes metformin twice a day. HPI   Prior to Admission medications   Medication Sig Start Date End Date Taking? Authorizing Provider  aspirin EC 81 MG tablet Take 81 mg by mouth daily.   Yes [provider]  Biotin 1000 MCG tablet Take 1,000 mcg by mouth 3 (three) times daily.   Yes [provider]  Calcium Carb-Cholecalciferol (CALCIUM 600-D PO) Take by mouth.   Yes [provider]  diclofenac (VOLTAREN) 75 MG EC tablet Take 1 tablet (75 mg total) by mouth 2 (two) times daily. 03/14/18  Yes Shawnee Knapp, MD  metFORMIN (GLUCOPHAGE) 500 MG tablet Take 1 tablet (500 mg total) by mouth 2 (two) times daily with a meal. 02/15/18  Yes Shawnee Knapp, MD  Multiple Vitamin (MULTIVITAMIN) tablet Take 1 tablet by mouth daily.   Yes [provider]  ranitidine (ZANTAC) 150 MG capsule Take 150 mg by mouth as needed for heartburn.   Yes [provider]  traZODone (DESYREL) 50 MG tablet Take 0.5-1 tablets (25-50 mg total) by mouth at bedtime as needed for sleep. 02/15/18  Yes Shawnee Knapp, MD  vitamin B-12 (CYANOCOBALAMIN) 1000 MCG tablet Take  1,000 mcg by mouth daily.   Yes [provider]  furosemide (LASIX) 20 MG tablet Take 1 tablet (20 mg total) by mouth daily. Patient not taking: Reported on 06/20/2018 03/07/18   Shawnee Knapp, MD    Allergies  Allergen Reactions  . Ace Inhibitors Cough  . Adhesive [Tape] Rash    Patient Active Problem List   Diagnosis Date Noted  . Osteoarthritis of spine with radiculopathy, cervical region 11/30/2017  . Other cervical disc degeneration, unspecified cervical region 07/17/2017  . Arthritis 06/23/2014  . Prediabetes 10/20/2013  . Obesity (BMI 35.0-39.9 without comorbidity) 10/20/2013  . Hypertension     Past Medical History:  Diagnosis Date  . Diabetes mellitus without complication ([place])   . Hypertension     Past Surgical History:  Procedure Laterality Date  . APPENDECTOMY    . CHOLECYSTECTOMY    . rotator cuff surgery Right 07/2014    Social History   Socioeconomic History  . Marital status: Married    Spouse name: Not on file  . Number of children: 2  . Years of education: Masters   . Highest education level: Not on file  Occupational History  . Occupation: Pharmacist, hospital  Social Needs  . Financial resource strain: Not on file  . Food insecurity:    Worry: Not on file    Inability: Not  on file  . Transportation needs:    Medical: Not on file    Non-medical: Not on file  Tobacco Use  . Smoking status: Never Smoker  . Smokeless tobacco: Never Used  Substance and Sexual Activity  . Alcohol use: Yes    Alcohol/week: 0.0 standard drinks    Comment: social  . Drug use: No  . Sexual activity: Yes  Lifestyle  . Physical activity:    Days per week: Not on file    Minutes per session: Not on file  . Stress: Not on file  Relationships  . Social connections:    Talks on phone: Not on file    Gets together: Not on file    Attends religious service: Not on file    Active member of club or organization: Not on file    Attends meetings of clubs or organizations:  Not on file    Relationship status: Not on file  . Intimate partner violence:    Fear of current or ex partner: Not on file    Emotionally abused: Not on file    Physically abused: Not on file    Forced sexual activity: Not on file  Other Topics Concern  . Not on file  Social History Narrative   Patient has had 2 pregnancies and has 2 living children.    Family History  Problem Relation Age of Onset  . Diabetes Father   . Hypertension Father   . Sickle cell anemia Brother   . Cancer Brother 15       Leukemia, passed away at 4  . Cancer Maternal Grandmother        breast cancer  . Breast cancer Neg Hx      Review of Systems  Constitutional: Negative.  Negative for chills and fever.  Respiratory: Negative for shortness of breath.   Cardiovascular: Negative for chest pain and palpitations.  Gastrointestinal: Negative for nausea and vomiting.  Genitourinary: Negative.   Musculoskeletal: Positive for joint pain (left ankle).  Skin: Negative.  Negative for rash.  Neurological: Negative.  Negative for dizziness and headaches.  Endo/Heme/Allergies: Negative.   All other systems reviewed and are negative.   Vitals:   06/20/18 0934  BP: (!) 167/80  Pulse: 87  Resp: 16  Temp: 98.7 F (37.1 C)  SpO2: 98%    Physical Exam  Constitutional: She is oriented to person, place, and time. She appears well-developed and well-nourished.  HENT:  Head: Normocephalic and atraumatic.  Eyes: Pupils are equal, round, and reactive to light. EOM are normal.  Neck: Normal range of motion.  Cardiovascular: Normal rate.  Pulmonary/Chest: Effort normal.  Musculoskeletal:  Left ankle: Positive swelling to medial malleolus area.  Tender to touch.  Full range of motion but complaining of pain.  NVI. Right ankle: Within normal limits with full range of motion.  No swelling or tenderness. Lower extremities: No pedal edema.  Neurological: She is alert and oriented to person, place, and time.   Skin: Capillary refill takes less than 2 seconds.  Psychiatric: She has a normal mood and affect. Her behavior is normal.  Vitals reviewed.   Results for orders placed or performed in visit on 06/20/18 (from the past 24 hour(s))  POCT glucose (manual entry)     Status: Abnormal   Collection Time: 06/20/18 10:04 AM  Result Value Ref Range   POC Glucose 108 (A) 70 - 99 mg/dl  POCT glycosylated hemoglobin (Hb A1C)     Status: Abnormal  Collection Time: 06/20/18 10:09 AM  Result Value Ref Range   Hemoglobin A1C 6.0 (A) 4.0 - 5.6 %   HbA1c POC (<> result, manual entry)     HbA1c, POC (prediabetic range)     HbA1c, POC (controlled diabetic range)       Pain of joint of left ankle and foot Gout attack a possibility.  Will treat accordingly.  Will repeat uric acid today.  Will prescribe indomethacin.  Hypertension Uncontrolled hypertension.  Systolic blood pressure at home consistently over 140.  Will restart amlodipine 5 mg daily.  Prediabetes Hemoglobin A1c at 6.0 today.  Will continue metformin twice a day and encouraged to follow diabetic diet.   A total of 40 minutes was spent in the room with the patient, greater than 50% of which was in counseling/coordination of care regarding chronic medical problems, management, medications, nutrition and need for follow-up.  ASSESSMENT & PLAN: Danaye was seen today for diabetes and ankle pain.  Diagnoses and all orders for this visit:  Pain of joint of left ankle and foot -     Uric Acid -     indomethacin (INDOCIN) 50 MG capsule; Take 1 capsule (50 mg total) by mouth 3 (three) times daily as needed for up to 5 days.  Prediabetes -     POCT glucose (manual entry) -     POCT glycosylated hemoglobin (Hb A1C)  Need for prophylactic vaccination and inoculation against influenza -     Flu Vaccine QUAD 36+ mos IM  Essential hypertension -     amLODipine (NORVASC) 5 MG tablet; Take 1 tablet (5 mg total) by mouth daily.  Other  orders -     blood glucose meter kit and supplies KIT; Dispense based on patient and insurance preference. Use up to four times daily as directed. (FOR ICD-9 250.00, 250.01).      Agustina Caroli, MD [facility]

## 2018-06-20 NOTE — Assessment & Plan Note (Signed)
Gout attack a possibility.  Will treat accordingly.  Will repeat uric acid today.  Will prescribe indomethacin.

## 2018-06-21 LAB — URIC ACID: URIC ACID: 5.5 mg/dL (ref 2.5–7.1)

## 2018-06-27 ENCOUNTER — Other Ambulatory Visit: Payer: Self-pay | Admitting: Emergency Medicine

## 2018-08-15 ENCOUNTER — Other Ambulatory Visit: Payer: Self-pay | Admitting: Family Medicine

## 2018-11-14 ENCOUNTER — Other Ambulatory Visit: Payer: Self-pay | Admitting: Family Medicine

## 2018-11-14 NOTE — Telephone Encounter (Signed)
Requested medication (s) are due for refill today: yes  Requested medication (s) are on the active medication list: yes  Last refill:  02/15/18  Future visit scheduled: yes  Notes to clinic:  Dr. Alver Fisher patient  Requested Prescriptions  Pending Prescriptions Disp Refills   traZODone (DESYREL) 50 MG tablet [Pharmacy Med Name: traZODone HCl 50 MG Oral Tablet] 30 tablet 0    Sig: TAKE 1/2 TO 1 (ONE-HALF TO ONE) TABLET BY MOUTH AT BEDTIME AS NEEDED FOR SLEEP     Psychiatry: Antidepressants - Serotonin Modulator Passed - 11/14/2018  1:35 PM      Passed - Valid encounter within last 6 months    Recent Outpatient Visits          4 months ago Pain of joint of left ankle and foot   [facility] at [facility], Eilleen Kempf, MD   8 months ago Essential hypertension   [facility] at [facility], [name], MD   8 months ago Pain of joint of left ankle and foot   [facility], [name], MD   9 months ago Annual physical exam   [facility], [name], MD   11 months ago Cervical disc disorder with radiculopathy of cervical region   [facility] at [facility], [name], MD      Future Appointments            In 2 weeks Sagardia, Eilleen Kempf, MD [facility] at [place], [facility]

## 2018-11-28 ENCOUNTER — Other Ambulatory Visit: Payer: Self-pay

## 2018-11-28 ENCOUNTER — Encounter: Payer: Self-pay | Admitting: Emergency Medicine

## 2018-11-28 ENCOUNTER — Ambulatory Visit: Payer: BC Managed Care – PPO | Admitting: Emergency Medicine

## 2018-11-28 VITALS — BP 150/89 | HR 84 | Temp 98.5°F | Resp 16 | Ht 65.0 in | Wt 232.8 lb

## 2018-11-28 DIAGNOSIS — Z1329 Encounter for screening for other suspected endocrine disorder: Secondary | ICD-10-CM

## 2018-11-28 DIAGNOSIS — R7303 Prediabetes: Secondary | ICD-10-CM

## 2018-11-28 DIAGNOSIS — I10 Essential (primary) hypertension: Secondary | ICD-10-CM | POA: Diagnosis not present

## 2018-11-28 MED ORDER — ROSUVASTATIN CALCIUM 10 MG PO TABS: 10.0000 mg | ORAL_TABLET | Freq: Every day | ORAL | 3 refills | Status: DC

## 2018-11-28 MED ORDER — GLUCOSE BLOOD VI STRP: ORAL_STRIP | 11 refills | Status: DC

## 2018-11-28 MED ORDER — ONETOUCH DELICA PLUS LANCET30G MISC: 1.0000 | Freq: Three times a day (TID) | 0 refills | Status: DC | PRN

## 2018-11-28 NOTE — Progress Notes (Signed)
Lab Results  Component Value Date   HGBA1C 6.0 (A) 06/20/2018   Lab Results  Component Value Date   CHOL 178 02/15/2018   HDL 47 02/15/2018   LDLCALC 102 (H) 02/15/2018   LDLDIRECT 113 (H) 10/11/2012   TRIG 143 02/15/2018   CHOLHDL 3.8 02/15/2018   BP Readings from Last 3 Encounters:  11/28/18 (!) 150/89  06/20/18 (!) 154/92  03/14/18 132/79   Katherine [name] 63 y.o.   Chief Complaint  Patient presents with  . Establish Care    medication refill- test strips and lancets  . Diabetes    follow up     HISTORY OF PRESENT ILLNESS: This is a 63 y.o. female with history of diabetes, hypertension here for follow-up.  Doing well.  Has no complaints or medical concerns.  HPI   Prior to Admission medications   Medication Sig Start Date End Date Taking? Authorizing Provider  amLODipine (NORVASC) 5 MG tablet Take 1 tablet (5 mg total) by mouth daily. 06/20/18  Yes SagardiaInes Bloomer, MD  aspirin EC 81 MG tablet Take 81 mg by mouth daily.   Yes [provider]  Biotin 1000 MCG tablet Take 1,000 mcg by mouth 3 (three) times daily.   Yes [provider]  Calcium Carb-Cholecalciferol (CALCIUM 600-D PO) Take by mouth.   Yes [provider]  Lancets (ONETOUCH DELICA PLUS [id number]) [place] USE   TO CHECK GLUCOSE 4 TIMES DAILY AS DIRECTED 06/27/18  Yes Shawnee Knapp, MD  metFORMIN (GLUCOPHAGE) 500 MG tablet TAKE 1 TABLET BY MOUTH TWICE DAILY WITH MEALS 08/16/18  Yes Shawnee Knapp, MD  Multiple Vitamin (MULTIVITAMIN) tablet Take 1 tablet by mouth daily.   Yes [provider]  ONE TOUCH ULTRA TEST test strip USE  STRIP TO CHECK GLUCOSE 4 TIMES DAILY AS DIRECTED 06/27/18  Yes Shawnee Knapp, MD  traZODone (DESYREL) 50 MG tablet TAKE 1/2 TO 1 (ONE-HALF TO ONE) TABLET BY MOUTH AT BEDTIME AS NEEDED FOR SLEEP 11/15/18  Yes Rutherford Guys, MD  vitamin B-12 (CYANOCOBALAMIN) 1000 MCG tablet Take 1,000 mcg by mouth daily.   Yes [provider]  blood glucose  meter kit and supplies KIT Dispense based on patient and insurance preference. Use up to four times daily as directed. (FOR ICD-9 250.00, 250.01). 06/20/18   [name], MD  diclofenac (VOLTAREN) 75 MG EC tablet Take 1 tablet (75 mg total) by mouth 2 (two) times daily. Patient not taking: Reported on 11/28/2018 03/14/18   Shawnee Knapp, MD  furosemide (LASIX) 20 MG tablet Take 1 tablet (20 mg total) by mouth daily. Patient not taking: Reported on 11/28/2018 03/07/18   Shawnee Knapp, MD  ranitidine (ZANTAC) 150 MG capsule Take 150 mg by mouth as needed for heartburn.    [provider]    Allergies  Allergen Reactions  . Ace Inhibitors Cough  . Adhesive [Tape] Rash    Patient Active Problem List   Diagnosis Date Noted  . Pain of joint of left ankle and foot 06/20/2018  . Osteoarthritis of spine with radiculopathy, cervical region 11/30/2017  . Other cervical disc degeneration, unspecified cervical region 07/17/2017  . Arthritis 06/23/2014  . Prediabetes 10/20/2013  . Obesity (BMI 35.0-39.9 without comorbidity) 10/20/2013  . Hypertension     Past Medical History:  Diagnosis Date  . Diabetes mellitus without complication ([place])   . Hypertension     Past Surgical History:  Procedure Laterality Date  . APPENDECTOMY    .  CHOLECYSTECTOMY    . rotator cuff surgery Right 07/2014    Social History   Socioeconomic History  . Marital status: Married    Spouse name: Not on file  . Number of children: 2  . Years of education: Masters   . Highest education level: Not on file  Occupational History  . Occupation: Pharmacist, hospital  Social Needs  . Financial resource strain: Not on file  . Food insecurity:    Worry: Not on file    Inability: Not on file  . Transportation needs:    Medical: Not on file    Non-medical: Not on file  Tobacco Use  . Smoking status: Never Smoker  . Smokeless tobacco: Never Used  Substance and Sexual Activity  . Alcohol use: Yes    Alcohol/week: 0.0  standard drinks    Comment: social  . Drug use: No  . Sexual activity: Yes  Lifestyle  . Physical activity:    Days per week: Not on file    Minutes per session: Not on file  . Stress: Not on file  Relationships  . Social connections:    Talks on phone: Not on file    Gets together: Not on file    Attends religious service: Not on file    Active member of club or organization: Not on file    Attends meetings of clubs or organizations: Not on file    Relationship status: Not on file  . Intimate partner violence:    Fear of current or ex partner: Not on file    Emotionally abused: Not on file    Physically abused: Not on file    Forced sexual activity: Not on file  Other Topics Concern  . Not on file  Social History Narrative   Patient has had 2 pregnancies and has 2 living children.    Family History  Problem Relation Age of Onset  . Diabetes Father   . Hypertension Father   . Sickle cell anemia Brother   . Cancer Brother 15       Leukemia, passed away at 52  . Cancer Maternal Grandmother        breast cancer  . Breast cancer Neg Hx      Review of Systems  Constitutional: Negative.  Negative for chills and fever.  HENT: Negative.  Negative for hearing loss.   Eyes: Negative.  Negative for blurred vision and double vision.  Respiratory: Negative.  Negative for cough and shortness of breath.   Cardiovascular: Negative.  Negative for chest pain and palpitations.  Gastrointestinal: Negative for abdominal pain, diarrhea, nausea and vomiting.  Genitourinary: Negative.  Negative for dysuria.  Musculoskeletal: Negative.   Skin: Negative.  Negative for rash.  Neurological: Negative.  Negative for dizziness and headaches.  Endo/Heme/Allergies: Negative.   All other systems reviewed and are negative.   Vitals:   11/28/18 1457  BP: (!) 150/89  Pulse: 84  Resp: 16  Temp: 98.5 F (36.9 C)  SpO2: 97%    Physical Exam Vitals signs reviewed.  Constitutional:       Appearance: She is obese.  HENT:     Head: Normocephalic and atraumatic.     Right Ear: Tympanic membrane, ear canal and external ear normal.     Left Ear: Tympanic membrane, ear canal and external ear normal.     Nose: Nose normal.     Mouth/Throat:     Mouth: Mucous membranes are moist.     Pharynx: Oropharynx is  clear.  Eyes:     Extraocular Movements: Extraocular movements intact.     Conjunctiva/sclera: Conjunctivae normal.     Pupils: Pupils are equal, round, and reactive to light.  Neck:     Musculoskeletal: Normal range of motion and neck supple.  Cardiovascular:     Rate and Rhythm: Normal rate and regular rhythm.     Heart sounds: Normal heart sounds.  Pulmonary:     Effort: Pulmonary effort is normal.     Breath sounds: Normal breath sounds.  Musculoskeletal: Normal range of motion.  Skin:    Capillary Refill: Capillary refill takes less than 2 seconds.  Neurological:     General: No focal deficit present.     Mental Status: She is alert and oriented to person, place, and time.  Psychiatric:        Mood and Affect: Mood normal.        Behavior: Behavior normal.      ASSESSMENT & PLAN: Athea was seen today for establish care and diabetes.  Diagnoses and all orders for this visit:  Prediabetes -     Cancel: POCT glucose (manual entry) -     Cancel: POCT glycosylated hemoglobin (Hb A1C) -     Hemoglobin A1c -     rosuvastatin (CRESTOR) 10 MG tablet; Take 1 tablet (10 mg total) by mouth daily. -     glucose blood (ONE TOUCH ULTRA TEST) test strip; USE  STRIP TO CHECK GLUCOSE 4 TIMES DAILY AS DIRECTED -     Lancets (ONETOUCH DELICA PLUS [id number]) MISC; Inject 1 Stick into the skin 3 (three) times daily as needed.  Screening for thyroid disorder -     TSH  Essential hypertension -     CBC with Differential/Platelet -     Comprehensive metabolic panel -     Lipid panel -     rosuvastatin (CRESTOR) 10 MG tablet; Take 1 tablet (10 mg total) by mouth  daily.    Patient Instructions       If you have lab work done today you will be contacted with your lab results within the next 2 weeks.  If you have not heard from [place] then please contact us. The fastest way to get your results is to register for My Chart.   IF you received an x-ray today, you will receive an invoice from [facility]. Please contact [facility] at [phone] with questions or concerns regarding your invoice.   IF you received labwork today, you will receive an invoice from [place]. Please contact [facility] at [phone] with questions or concerns regarding your invoice.   Our billing staff will not be able to assist you with questions regarding bills from these companies.  You will be contacted with the lab results as soon as they are available. The fastest way to get your results is to activate your My Chart account. Instructions are located on the last page of this paperwork. If you have not heard from [place] regarding the results in 2 weeks, please contact this office.     Hypertension Hypertension, commonly called high blood pressure, is when the force of blood pumping through the arteries is too strong. The arteries are the blood vessels that carry blood from the heart throughout the body. Hypertension forces the heart to work harder to pump blood and may cause arteries to become narrow or stiff. Having untreated or uncontrolled hypertension can cause heart attacks, strokes, kidney disease, and other problems. A blood pressure  reading consists of a higher number over a lower number. Ideally, your blood pressure should be below 120/80. The first ("top") number is called the systolic pressure. It is a measure of the pressure in your arteries as your heart beats. The second ("bottom") number is called the diastolic pressure. It is a measure of the pressure in your arteries as the heart relaxes. What are the causes? The cause of this condition is not  known. What increases the risk? Some risk factors for high blood pressure are under your control. Others are not. Factors you can change  Smoking.  Having type 2 diabetes mellitus, high cholesterol, or both.  Not getting enough exercise or physical activity.  Being overweight.  Having too much fat, sugar, calories, or salt (sodium) in your diet.  Drinking too much alcohol. Factors that are difficult or impossible to change  Having chronic kidney disease.  Having a family history of high blood pressure.  Age. Risk increases with age.  Race. You may be at higher risk if you are African-American.  Gender. Men are at higher risk than women before age 43. After age 25, women are at higher risk than men.  Having obstructive sleep apnea.  Stress. What are the signs or symptoms? Extremely high blood pressure (hypertensive crisis) may cause:  Headache.  Anxiety.  Shortness of breath.  Nosebleed.  Nausea and vomiting.  Severe chest pain.  Jerky movements you cannot control (seizures). How is this diagnosed? This condition is diagnosed by measuring your blood pressure while you are seated, with your arm resting on a surface. The cuff of the blood pressure monitor will be placed directly against the skin of your upper arm at the level of your heart. It should be measured at least twice using the same arm. Certain conditions can cause a difference in blood pressure between your right and left arms. Certain factors can cause blood pressure readings to be lower or higher than normal (elevated) for a short period of time:  When your blood pressure is higher when you are in a health care provider's office than when you are at home, this is called white coat hypertension. Most people with this condition do not need medicines.  When your blood pressure is higher at home than when you are in a health care provider's office, this is called masked hypertension. Most people with this  condition may need medicines to control blood pressure. If you have a high blood pressure reading during one visit or you have normal blood pressure with other risk factors:  You may be asked to return on a different day to have your blood pressure checked again.  You may be asked to monitor your blood pressure at home for 1 week or longer. If you are diagnosed with hypertension, you may have other blood or imaging tests to help your health care provider understand your overall risk for other conditions. How is this treated? This condition is treated by making healthy lifestyle changes, such as eating healthy foods, exercising more, and reducing your alcohol intake. Your health care provider may prescribe medicine if lifestyle changes are not enough to get your blood pressure under control, and if:  Your systolic blood pressure is above 130.  Your diastolic blood pressure is above 80. Your personal target blood pressure may vary depending on your medical conditions, your age, and other factors. Follow these instructions at home: Eating and drinking   Eat a diet that is high in fiber and potassium,  and low in sodium, added sugar, and fat. An example eating plan is called the DASH (Dietary Approaches to Stop Hypertension) diet. To eat this way: ? Eat plenty of fresh fruits and vegetables. Try to fill half of your plate at each meal with fruits and vegetables. ? Eat whole grains, such as whole wheat pasta, brown rice, or whole grain bread. Fill about one quarter of your plate with whole grains. ? Eat or drink low-fat dairy products, such as skim milk or low-fat yogurt. ? Avoid fatty cuts of meat, processed or cured meats, and poultry with skin. Fill about one quarter of your plate with lean proteins, such as fish, chicken without skin, beans, eggs, and tofu. ? Avoid premade and processed foods. These tend to be higher in sodium, added sugar, and fat.  Reduce your daily sodium intake. Most  people with hypertension should eat less than 1,500 mg of sodium a day.  Limit alcohol intake to no more than 1 drink a day for nonpregnant women and 2 drinks a day for men. One drink equals 12 oz of beer, 5 oz of wine, or 1 oz of hard liquor. Lifestyle   Work with your health care provider to maintain a healthy body weight or to lose weight. Ask what an ideal weight is for you.  Get at least 30 minutes of exercise that causes your heart to beat faster (aerobic exercise) most days of the week. Activities may include walking, swimming, or biking.  Include exercise to strengthen your muscles (resistance exercise), such as pilates or lifting weights, as part of your weekly exercise routine. Try to do these types of exercises for 30 minutes at least 3 days a week.  Do not use any products that contain nicotine or tobacco, such as cigarettes and e-cigarettes. If you need help quitting, ask your health care provider.  Monitor your blood pressure at home as told by your health care provider.  Keep all follow-up visits as told by your health care provider. This is important. Medicines  Take over-the-counter and prescription medicines only as told by your health care provider. Follow directions carefully. Blood pressure medicines must be taken as prescribed.  Do not skip doses of blood pressure medicine. Doing this puts you at risk for problems and can make the medicine less effective.  Ask your health care provider about side effects or reactions to medicines that you should watch for. Contact a health care provider if:  You think you are having a reaction to a medicine you are taking.  You have headaches that keep coming back (recurring).  You feel dizzy.  You have swelling in your ankles.  You have trouble with your vision. Get help right away if:  You develop a severe headache or confusion.  You have unusual weakness or numbness.  You feel faint.  You have severe pain in your  chest or abdomen.  You vomit repeatedly.  You have trouble breathing. Summary  Hypertension is when the force of blood pumping through your arteries is too strong. If this condition is not controlled, it may put you at risk for serious complications.  Your personal target blood pressure may vary depending on your medical conditions, your age, and other factors. For most people, a normal blood pressure is less than 120/80.  Hypertension is treated with lifestyle changes, medicines, or a combination of both. Lifestyle changes include weight loss, eating a healthy, low-sodium diet, exercising more, and limiting alcohol. This information is not intended to  replace advice given to you by your health care provider. Make sure you discuss any questions you have with your health care provider. Document Released: 09/14/2005 Document Revised: 08/12/2016 Document Reviewed: 08/12/2016 Elsevier Interactive Patient Education  2019 [place].  Diabetes Mellitus and Nutrition, Adult When you have diabetes (diabetes mellitus), it is very important to have healthy eating habits because your blood sugar (glucose) levels are greatly affected by what you eat and drink. Eating healthy foods in the appropriate amounts, at about the same times every day, can help you:  Control your blood glucose.  Lower your risk of heart disease.  Improve your blood pressure.  Reach or maintain a healthy weight. Every person with diabetes is different, and each person has different needs for a meal plan. Your health care provider may recommend that you work with a diet and nutrition specialist (dietitian) to make a meal plan that is best for you. Your meal plan may vary depending on factors such as:  The calories you need.  The medicines you take.  Your weight.  Your blood glucose, blood pressure, and cholesterol levels.  Your activity level.  Other health conditions you have, such as heart or kidney disease. How  do carbohydrates affect me? Carbohydrates, also called carbs, affect your blood glucose level more than any other type of food. Eating carbs naturally raises the amount of glucose in your blood. Carb counting is a method for keeping track of how many carbs you eat. Counting carbs is important to keep your blood glucose at a healthy level, especially if you use insulin or take certain oral diabetes medicines. It is important to know how many carbs you can safely have in each meal. This is different for every person. Your dietitian can help you calculate how many carbs you should have at each meal and for each snack. Foods that contain carbs include:  Bread, cereal, rice, pasta, and crackers.  Potatoes and corn.  Peas, beans, and lentils.  Milk and yogurt.  Fruit and juice.  Desserts, such as cakes, cookies, ice cream, and candy. How does alcohol affect me? Alcohol can cause a sudden decrease in blood glucose (hypoglycemia), especially if you use insulin or take certain oral diabetes medicines. Hypoglycemia can be a life-threatening condition. Symptoms of hypoglycemia (sleepiness, dizziness, and confusion) are similar to symptoms of having too much alcohol. If your health care provider says that alcohol is safe for you, follow these guidelines:  Limit alcohol intake to no more than 1 drink per day for nonpregnant women and 2 drinks per day for men. One drink equals 12 oz of beer, 5 oz of wine, or 1 oz of hard liquor.  Do not drink on an empty stomach.  Keep yourself hydrated with water, diet soda, or unsweetened iced tea.  Keep in mind that regular soda, juice, and other mixers may contain a lot of sugar and must be counted as carbs. What are tips for following this plan?  Reading food labels  Start by checking the serving size on the "Nutrition Facts" label of packaged foods and drinks. The amount of calories, carbs, fats, and other nutrients listed on the label is based on one serving  of the item. Many items contain more than one serving per package.  Check the total grams (g) of carbs in one serving. You can calculate the number of servings of carbs in one serving by dividing the total carbs by 15. For example, if a food has 30 g of  total carbs, it would be equal to 2 servings of carbs.  Check the number of grams (g) of saturated and trans fats in one serving. Choose foods that have low or no amount of these fats.  Check the number of milligrams (mg) of salt (sodium) in one serving. Most people should limit total sodium intake to less than 2,300 mg per day.  Always check the nutrition information of foods labeled as "low-fat" or "nonfat". These foods may be higher in added sugar or refined carbs and should be avoided.  Talk to your dietitian to identify your daily goals for nutrients listed on the label. Shopping  Avoid buying canned, premade, or processed foods. These foods tend to be high in fat, sodium, and added sugar.  Shop around the outside edge of the grocery store. This includes fresh fruits and vegetables, bulk grains, fresh meats, and fresh dairy. Cooking  Use low-heat cooking methods, such as baking, instead of high-heat cooking methods like deep frying.  Cook using healthy oils, such as olive, canola, or sunflower oil.  Avoid cooking with butter, cream, or high-fat meats. Meal planning  Eat meals and snacks regularly, preferably at the same times every day. Avoid going long periods of time without eating.  Eat foods high in fiber, such as fresh fruits, vegetables, beans, and whole grains. Talk to your dietitian about how many servings of carbs you can eat at each meal.  Eat 4-6 ounces (oz) of lean protein each day, such as lean meat, chicken, fish, eggs, or tofu. One oz of lean protein is equal to: ? 1 oz of meat, chicken, or fish. ? 1 egg. ?  cup of tofu.  Eat some foods each day that contain healthy fats, such as avocado, nuts, seeds, and  fish. Lifestyle  Check your blood glucose regularly.  Exercise regularly as told by your health care provider. This may include: ? 150 minutes of moderate-intensity or vigorous-intensity exercise each week. This could be brisk walking, biking, or water aerobics. ? Stretching and doing strength exercises, such as yoga or weightlifting, at least 2 times a week.  Take medicines as told by your health care provider.  Do not use any products that contain nicotine or tobacco, such as cigarettes and e-cigarettes. If you need help quitting, ask your health care provider.  Work with a Social worker or diabetes educator to identify strategies to manage stress and any emotional and social challenges. Questions to ask a health care provider  Do I need to meet with a diabetes educator?  Do I need to meet with a dietitian?  What number can I call if I have questions?  When are the best times to check my blood glucose? Where to find more information:  American Diabetes Association: diabetes.org  Academy of Nutrition and Dietetics: www.eatright.[employer] of Diabetes and Digestive and Kidney Diseases (NIH): [employer] Summary  A healthy meal plan will help you control your blood glucose and maintain a healthy lifestyle.  Working with a diet and nutrition specialist (dietitian) can help you make a meal plan that is best for you.  Keep in mind that carbohydrates (carbs) and alcohol have immediate effects on your blood glucose levels. It is important to count carbs and to use alcohol carefully. This information is not intended to replace advice given to you by your health care provider. Make sure you discuss any questions you have with your health care provider. Document Released: 06/11/2005 Document Revised: 04/14/2017 Document Reviewed: 10/19/2016 Elsevier Interactive  Patient Education  2019 [employer].      Agustina Caroli, MD [facility]

## 2018-11-28 NOTE — Patient Instructions (Addendum)
   If you have lab work done today you will be contacted with your lab results within the next 2 weeks.  If you have not heard from us then please contact us. The fastest way to get your results is to register for My Chart.   IF you received an x-ray today, you will receive an invoice from [facility]. Please contact [facility] at [phone] with questions or concerns regarding your invoice.   IF you received labwork today, you will receive an invoice from [facility]. Please contact [facility] at [phone] with questions or concerns regarding your invoice.   Our billing staff will not be able to assist you with questions regarding bills from these companies.  You will be contacted with the lab results as soon as they are available. The fastest way to get your results is to activate your My Chart account. Instructions are located on the last page of this paperwork. If you have not heard from us regarding the results in 2 weeks, please contact this office.     Hypertension Hypertension, commonly called high blood pressure, is when the force of blood pumping through the arteries is too strong. The arteries are the blood vessels that carry blood from the heart throughout the body. Hypertension forces the heart to work harder to pump blood and may cause arteries to become narrow or stiff. Having untreated or uncontrolled hypertension can cause heart attacks, strokes, kidney disease, and other problems. A blood pressure reading consists of a higher number over a lower number. Ideally, your blood pressure should be below 120/80. The first ("top") number is called the systolic pressure. It is a measure of the pressure in your arteries as your heart beats. The second ("bottom") number is called the diastolic pressure. It is a measure of the pressure in your arteries as the heart relaxes. What are the causes? The cause of this condition is not known. What increases the risk? Some  risk factors for high blood pressure are under your control. Others are not. Factors you can change  Smoking.  Having type 2 diabetes mellitus, high cholesterol, or both.  Not getting enough exercise or physical activity.  Being overweight.  Having too much fat, sugar, calories, or salt (sodium) in your diet.  Drinking too much alcohol. Factors that are difficult or impossible to change  Having chronic kidney disease.  Having a family history of high blood pressure.  Age. Risk increases with age.  Race. You may be at higher risk if you are African-American.  Gender. Men are at higher risk than women before age 45. After age 65, women are at higher risk than men.  Having obstructive sleep apnea.  Stress. What are the signs or symptoms? Extremely high blood pressure (hypertensive crisis) may cause:  Headache.  Anxiety.  Shortness of breath.  Nosebleed.  Nausea and vomiting.  Severe chest pain.  Jerky movements you cannot control (seizures). How is this diagnosed? This condition is diagnosed by measuring your blood pressure while you are seated, with your arm resting on a surface. The cuff of the blood pressure monitor will be placed directly against the skin of your upper arm at the level of your heart. It should be measured at least twice using the same arm. Certain conditions can cause a difference in blood pressure between your right and left arms. Certain factors can cause blood pressure readings to be lower or higher than normal (elevated) for a short period of time:  When your   blood pressure is higher when you are in a health care provider's office than when you are at home, this is called white coat hypertension. Most people with this condition do not need medicines.  When your blood pressure is higher at home than when you are in a health care provider's office, this is called masked hypertension. Most people with this condition may need medicines to control  blood pressure. If you have a high blood pressure reading during one visit or you have normal blood pressure with other risk factors:  You may be asked to return on a different day to have your blood pressure checked again.  You may be asked to monitor your blood pressure at home for 1 week or longer. If you are diagnosed with hypertension, you may have other blood or imaging tests to help your health care provider understand your overall risk for other conditions. How is this treated? This condition is treated by making healthy lifestyle changes, such as eating healthy foods, exercising more, and reducing your alcohol intake. Your health care provider may prescribe medicine if lifestyle changes are not enough to get your blood pressure under control, and if:  Your systolic blood pressure is above 130.  Your diastolic blood pressure is above 80. Your personal target blood pressure may vary depending on your medical conditions, your age, and other factors. Follow these instructions at home: Eating and drinking   Eat a diet that is high in fiber and potassium, and low in sodium, added sugar, and fat. An example eating plan is called the DASH (Dietary Approaches to Stop Hypertension) diet. To eat this way: ? Eat plenty of fresh fruits and vegetables. Try to fill half of your plate at each meal with fruits and vegetables. ? Eat whole grains, such as whole wheat pasta, brown rice, or whole grain bread. Fill about one quarter of your plate with whole grains. ? Eat or drink low-fat dairy products, such as skim milk or low-fat yogurt. ? Avoid fatty cuts of meat, processed or cured meats, and poultry with skin. Fill about one quarter of your plate with lean proteins, such as fish, chicken without skin, beans, eggs, and tofu. ? Avoid premade and processed foods. These tend to be higher in sodium, added sugar, and fat.  Reduce your daily sodium intake. Most people with hypertension should eat less than  1,500 mg of sodium a day.  Limit alcohol intake to no more than 1 drink a day for nonpregnant women and 2 drinks a day for men. One drink equals 12 oz of beer, 5 oz of wine, or 1 oz of hard liquor. Lifestyle   Work with your health care provider to maintain a healthy body weight or to lose weight. Ask what an ideal weight is for you.  Get at least 30 minutes of exercise that causes your heart to beat faster (aerobic exercise) most days of the week. Activities may include walking, swimming, or biking.  Include exercise to strengthen your muscles (resistance exercise), such as pilates or lifting weights, as part of your weekly exercise routine. Try to do these types of exercises for 30 minutes at least 3 days a week.  Do not use any products that contain nicotine or tobacco, such as cigarettes and e-cigarettes. If you need help quitting, ask your health care provider.  Monitor your blood pressure at home as told by your health care provider.  Keep all follow-up visits as told by your health care provider. This is   important. Medicines  Take over-the-counter and prescription medicines only as told by your health care provider. Follow directions carefully. Blood pressure medicines must be taken as prescribed.  Do not skip doses of blood pressure medicine. Doing this puts you at risk for problems and can make the medicine less effective.  Ask your health care provider about side effects or reactions to medicines that you should watch for. Contact a health care provider if:  You think you are having a reaction to a medicine you are taking.  You have headaches that keep coming back (recurring).  You feel dizzy.  You have swelling in your ankles.  You have trouble with your vision. Get help right away if:  You develop a severe headache or confusion.  You have unusual weakness or numbness.  You feel faint.  You have severe pain in your chest or abdomen.  You vomit  repeatedly.  You have trouble breathing. Summary  Hypertension is when the force of blood pumping through your arteries is too strong. If this condition is not controlled, it may put you at risk for serious complications.  Your personal target blood pressure may vary depending on your medical conditions, your age, and other factors. For most people, a normal blood pressure is less than 120/80.  Hypertension is treated with lifestyle changes, medicines, or a combination of both. Lifestyle changes include weight loss, eating a healthy, low-sodium diet, exercising more, and limiting alcohol. This information is not intended to replace advice given to you by your health care provider. Make sure you discuss any questions you have with your health care provider. Document Released: 09/14/2005 Document Revised: 08/12/2016 Document Reviewed: 08/12/2016 Elsevier Interactive Patient Education  2019 Elsevier Inc.  Diabetes Mellitus and Nutrition, Adult When you have diabetes (diabetes mellitus), it is very important to have healthy eating habits because your blood sugar (glucose) levels are greatly affected by what you eat and drink. Eating healthy foods in the appropriate amounts, at about the same times every day, can help you:  Control your blood glucose.  Lower your risk of heart disease.  Improve your blood pressure.  Reach or maintain a healthy weight. Every person with diabetes is different, and each person has different needs for a meal plan. Your health care provider may recommend that you work with a diet and nutrition specialist (dietitian) to make a meal plan that is best for you. Your meal plan may vary depending on factors such as:  The calories you need.  The medicines you take.  Your weight.  Your blood glucose, blood pressure, and cholesterol levels.  Your activity level.  Other health conditions you have, such as heart or kidney disease. How do carbohydrates affect  me? Carbohydrates, also called carbs, affect your blood glucose level more than any other type of food. Eating carbs naturally raises the amount of glucose in your blood. Carb counting is a method for keeping track of how many carbs you eat. Counting carbs is important to keep your blood glucose at a healthy level, especially if you use insulin or take certain oral diabetes medicines. It is important to know how many carbs you can safely have in each meal. This is different for every person. Your dietitian can help you calculate how many carbs you should have at each meal and for each snack. Foods that contain carbs include:  Bread, cereal, rice, pasta, and crackers.  Potatoes and corn.  Peas, beans, and lentils.  Milk and yogurt.  Fruit and   juice.  Desserts, such as cakes, cookies, ice cream, and candy. How does alcohol affect me? Alcohol can cause a sudden decrease in blood glucose (hypoglycemia), especially if you use insulin or take certain oral diabetes medicines. Hypoglycemia can be a life-threatening condition. Symptoms of hypoglycemia (sleepiness, dizziness, and confusion) are similar to symptoms of having too much alcohol. If your health care provider says that alcohol is safe for you, follow these guidelines:  Limit alcohol intake to no more than 1 drink per day for nonpregnant women and 2 drinks per day for men. One drink equals 12 oz of beer, 5 oz of wine, or 1 oz of hard liquor.  Do not drink on an empty stomach.  Keep yourself hydrated with water, diet soda, or unsweetened iced tea.  Keep in mind that regular soda, juice, and other mixers may contain a lot of sugar and must be counted as carbs. What are tips for following this plan?  Reading food labels  Start by checking the serving size on the "Nutrition Facts" label of packaged foods and drinks. The amount of calories, carbs, fats, and other nutrients listed on the label is based on one serving of the item. Many items  contain more than one serving per package.  Check the total grams (g) of carbs in one serving. You can calculate the number of servings of carbs in one serving by dividing the total carbs by 15. For example, if a food has 30 g of total carbs, it would be equal to 2 servings of carbs.  Check the number of grams (g) of saturated and trans fats in one serving. Choose foods that have low or no amount of these fats.  Check the number of milligrams (mg) of salt (sodium) in one serving. Most people should limit total sodium intake to less than 2,300 mg per day.  Always check the nutrition information of foods labeled as "low-fat" or "nonfat". These foods may be higher in added sugar or refined carbs and should be avoided.  Talk to your dietitian to identify your daily goals for nutrients listed on the label. Shopping  Avoid buying canned, premade, or processed foods. These foods tend to be high in fat, sodium, and added sugar.  Shop around the outside edge of the grocery store. This includes fresh fruits and vegetables, bulk grains, fresh meats, and fresh dairy. Cooking  Use low-heat cooking methods, such as baking, instead of high-heat cooking methods like deep frying.  Cook using healthy oils, such as olive, canola, or sunflower oil.  Avoid cooking with butter, cream, or high-fat meats. Meal planning  Eat meals and snacks regularly, preferably at the same times every day. Avoid going long periods of time without eating.  Eat foods high in fiber, such as fresh fruits, vegetables, beans, and whole grains. Talk to your dietitian about how many servings of carbs you can eat at each meal.  Eat 4-6 ounces (oz) of lean protein each day, such as lean meat, chicken, fish, eggs, or tofu. One oz of lean protein is equal to: ? 1 oz of meat, chicken, or fish. ? 1 egg. ?  cup of tofu.  Eat some foods each day that contain healthy fats, such as avocado, nuts, seeds, and fish. Lifestyle  Check your  blood glucose regularly.  Exercise regularly as told by your health care provider. This may include: ? 150 minutes of moderate-intensity or vigorous-intensity exercise each week. This could be brisk walking, biking, or water aerobics. ? Stretching   and doing strength exercises, such as yoga or weightlifting, at least 2 times a week.  Take medicines as told by your health care provider.  Do not use any products that contain nicotine or tobacco, such as cigarettes and e-cigarettes. If you need help quitting, ask your health care provider.  Work with a counselor or diabetes educator to identify strategies to manage stress and any emotional and social challenges. Questions to ask a health care provider  Do I need to meet with a diabetes educator?  Do I need to meet with a dietitian?  What number can I call if I have questions?  When are the best times to check my blood glucose? Where to find more information:  American Diabetes Association: diabetes.org  Academy of Nutrition and Dietetics: www.eatright.org  National Institute of Diabetes and Digestive and Kidney Diseases (NIH): www.niddk.nih.gov Summary  A healthy meal plan will help you control your blood glucose and maintain a healthy lifestyle.  Working with a diet and nutrition specialist (dietitian) can help you make a meal plan that is best for you.  Keep in mind that carbohydrates (carbs) and alcohol have immediate effects on your blood glucose levels. It is important to count carbs and to use alcohol carefully. This information is not intended to replace advice given to you by your health care provider. Make sure you discuss any questions you have with your health care provider. Document Released: 06/11/2005 Document Revised: 04/14/2017 Document Reviewed: 10/19/2016 Elsevier Interactive Patient Education  2019 Elsevier Inc.  

## 2018-11-28 NOTE — Progress Notes (Signed)
The 10-year ASCVD risk score Denman George DC Montez Hageman., et al., 2013) is: 26.3%   Values used to calculate the score:     Age: 63 years     Sex: Female     Is Non-Hispanic African American: Yes     Diabetic: Yes     Tobacco smoker: No     Systolic Blood Pressure: 150 mmHg     Is BP treated: Yes     HDL Cholesterol: 47 mg/dL     Total Cholesterol: 178 mg/dL

## 2018-11-29 ENCOUNTER — Encounter: Payer: Self-pay | Admitting: Emergency Medicine

## 2018-11-29 LAB — CBC WITH DIFFERENTIAL/PLATELET
BASOS ABS: 0.1 10*3/uL (ref 0.0–0.2)
Basos: 1 %
EOS (ABSOLUTE): 0.1 10*3/uL (ref 0.0–0.4)
Eos: 1 %
Hematocrit: 39.9 % (ref 34.0–46.6)
Hemoglobin: 14.1 g/dL (ref 11.1–15.9)
Immature Grans (Abs): 0 10*3/uL (ref 0.0–0.1)
Immature Granulocytes: 0 %
LYMPHS: 53 %
Lymphocytes Absolute: 2.7 10*3/uL (ref 0.7–3.1)
MCH: 28.3 pg (ref 26.6–33.0)
MCHC: 35.3 g/dL (ref 31.5–35.7)
MCV: 80 fL (ref 79–97)
Monocytes Absolute: 0.3 10*3/uL (ref 0.1–0.9)
Monocytes: 6 %
Neutrophils Absolute: 2 10*3/uL (ref 1.4–7.0)
Neutrophils: 39 %
PLATELETS: 259 10*3/uL (ref 150–450)
RBC: 4.99 x10E6/uL (ref 3.77–5.28)
RDW: 14.7 % (ref 11.7–15.4)
WBC: 5 10*3/uL (ref 3.4–10.8)

## 2018-11-29 LAB — COMPREHENSIVE METABOLIC PANEL
ALT: 17 IU/L (ref 0–32)
AST: 18 IU/L (ref 0–40)
Albumin/Globulin Ratio: 1.4 (ref 1.2–2.2)
Albumin: 4.5 g/dL (ref 3.8–4.8)
Alkaline Phosphatase: 106 IU/L (ref 39–117)
BUN/Creatinine Ratio: 15 (ref 12–28)
BUN: 13 mg/dL (ref 8–27)
Bilirubin Total: 0.4 mg/dL (ref 0.0–1.2)
CO2: 21 mmol/L (ref 20–29)
Calcium: 9.6 mg/dL (ref 8.7–10.3)
Chloride: 108 mmol/L — ABNORMAL HIGH (ref 96–106)
Creatinine, Ser: 0.89 mg/dL (ref 0.57–1.00)
GFR calc Af Amer: 79 mL/min/{1.73_m2} (ref >59)
GFR calc non Af Amer: 69 mL/min/{1.73_m2} (ref >59)
GLUCOSE: 92 mg/dL (ref 65–99)
Globulin, Total: 3.2 g/dL (ref 1.5–4.5)
Potassium: 4.2 mmol/L (ref 3.5–5.2)
Sodium: 143 mmol/L (ref 134–144)
Total Protein: 7.7 g/dL (ref 6.0–8.5)

## 2018-11-29 LAB — LIPID PANEL
Chol/HDL Ratio: 3.7 ratio (ref 0.0–4.4)
Cholesterol, Total: 188 mg/dL (ref 100–199)
HDL: 51 mg/dL (ref >39)
LDL Calculated: 116 mg/dL — ABNORMAL HIGH (ref 0–99)
TRIGLYCERIDES: 104 mg/dL (ref 0–149)
VLDL Cholesterol Cal: 21 mg/dL (ref 5–40)

## 2018-11-29 LAB — HEMOGLOBIN A1C
Est. average glucose Bld gHb Est-mCnc: 117 mg/dL
Hgb A1c MFr Bld: 5.7 % — ABNORMAL HIGH (ref 4.8–5.6)

## 2018-11-29 LAB — TSH: TSH: 1.26 u[IU]/mL (ref 0.450–4.500)

## 2018-12-02 ENCOUNTER — Ambulatory Visit: Payer: Self-pay | Admitting: *Deleted

## 2018-12-02 NOTE — Telephone Encounter (Signed)
See triage encounter from 12/02/18.

## 2018-12-02 NOTE — Telephone Encounter (Signed)
Pt. Returned call.  Questioned if there is an alternative to the Crestor, that Dr. Alvy Bimler would recommend, that has less side effects.  Reported after reading the side effects, she is not eager to take it.  Reported she already picked it up, and will start taking it, but still wants to know about any other options.  Advised will send message to Dr. Alvy Bimler.

## 2018-12-02 NOTE — Telephone Encounter (Signed)
Message from Katherine [name] sent at 12/02/2018 12:26 PM EST   Summary: Medication questions   Patient wants to understand why she was prescribed rosuvastatin (CRESTOR) 10 MG tablet [[id number]]. Patient did not discuss this with Dr.         [name] pt's call and read MyChart message with results of lab work that was sent on 11/29/18 to the pt. Pt states she has not accessed MyChart at this time. Pt states she has read some of the side effects of Crestor and does not want to take the medication.  Pt states she has been exercising and  walks 3-5 miles, 4 days a week. Pt wants to know if Dr. Ivory Broad would have other recommendations to decrease bad cholesterol because she does not want to be on Crestor due to the side effects.

## 2018-12-02 NOTE — Telephone Encounter (Signed)
Dr Alvy Bimler patient wants to know is there is an alternative to the Crestor, that Dr. Alvy Bimler would recommend, that has less side effects

## 2018-12-02 NOTE — Telephone Encounter (Signed)
Crestor is one of the safest statins with the least side effects.  Thanks.

## 2019-01-16 ENCOUNTER — Other Ambulatory Visit: Payer: Self-pay | Admitting: Family Medicine

## 2019-01-16 NOTE — Telephone Encounter (Signed)
Requested medication (s) are due for refill today -yes  Requested medication (s) are on the active medication list -yes  Future visit scheduled -yes  Last refill: 08/16/18  Notes to clinic: Patient is requesting a refill that was previously refilled by Provider no longer at practice- patient has seen another provider since.  Requested Prescriptions  Pending Prescriptions Disp Refills   metFORMIN (GLUCOPHAGE) 500 MG tablet [Pharmacy Med Name: metFORMIN HCl 500 MG Oral Tablet] 180 tablet 0    Sig: TAKE 1 TABLET BY MOUTH TWICE DAILY WITH MEALS     Endocrinology:  Diabetes - Biguanides Passed - 01/16/2019  1:24 PM      Passed - Cr in normal range and within 360 days    Creat  Date Value Ref Range Status  07/23/2016 0.97 0.50 - 0.99 mg/dL Final    Comment:      For patients > or = 65 years of age: The upper reference limit for Creatinine is approximately 13% higher for people identified as African-American.      Creatinine, Ser  Date Value Ref Range Status  11/28/2018 0.89 0.57 - 1.00 mg/dL Final         Passed - HBA1C is between 0 and 7.9 and within 180 days    Hgb A1c MFr Bld  Date Value Ref Range Status  11/28/2018 5.7 (H) 4.8 - 5.6 % Final    Comment:             Prediabetes: 5.7 - 6.4          Diabetes: >6.4          Glycemic control for adults with diabetes: <7.0          Passed - eGFR in normal range and within 360 days    GFR, Est African American  Date Value Ref Range Status  10/11/2012 79 mL/min Final   GFR calc Af Amer  Date Value Ref Range Status  11/28/2018 79 >59 mL/min/1.73 Final   GFR, Est Non African American  Date Value Ref Range Status  10/11/2012 69 mL/min Final    Comment:      The estimated GFR is a calculation valid for adults (17 to 65 years old) that uses the CKD-EPI algorithm to adjust for age and sex. It is not to be used for children, pregnant women, hospitalized patients, patients on dialysis, or with rapidly changing kidney  function. According to the NKDEP, eGFR >89 is normal, 60-89 shows mild impairment, 30-59 shows moderate impairment, 15-29 shows severe impairment and <15 is ESRD.   GFR calc non Af Amer  Date Value Ref Range Status  11/28/2018 69 >59 mL/min/1.73 Final         Passed - Valid encounter within last 6 months    Recent Outpatient Visits          1 month ago Prediabetes   Primary Care at [facility], [place], [place]   7 months ago Pain of joint of left ankle and foot   Primary Care at [facility], [name], [place]   10 months ago Essential hypertension   Primary Care at [place], [place]   10 months ago Pain of joint of left ankle and foot   Primary Care at [facility], [place]urey Arrow, [place]   11 months ago Annual physical exam   Primary Care at [facility], [place] Arrow, [place]      Future Appointments  In 4 months Sagardia, [name], MD Primary Care at [place], [facility]            Requested Prescriptions  Pending Prescriptions Disp Refills   metFORMIN (GLUCOPHAGE) 500 MG tablet [Pharmacy Med Name: metFORMIN HCl 500 MG Oral Tablet] 180 tablet 0    Sig: TAKE 1 TABLET BY MOUTH TWICE DAILY WITH MEALS     Endocrinology:  Diabetes - Biguanides Passed - 01/16/2019  1:24 PM      Passed - Cr in normal range and within 360 days    Creat  Date Value Ref Range Status  07/23/2016 0.97 0.50 - 0.99 mg/dL Final    Comment:      For patients > or = 65 years of age: The upper reference limit for Creatinine is approximately 13% higher for people identified as African-American.      Creatinine, Ser  Date Value Ref Range Status  11/28/2018 0.89 0.57 - 1.00 mg/dL Final         Passed - HBA1C is between 0 and 7.9 and within 180 days    Hgb A1c MFr Bld  Date Value Ref Range Status  11/28/2018 5.7 (H) 4.8 - 5.6 % Final    Comment:             Prediabetes: 5.7 - 6.4          Diabetes: >6.4          Glycemic control for adults with diabetes: <7.0          Passed - eGFR in normal  range and within 360 days    GFR, Est African American  Date Value Ref Range Status  10/11/2012 79 mL/min Final   GFR calc Af Amer  Date Value Ref Range Status  11/28/2018 79 >59 mL/min/1.73 Final   GFR, Est Non African American  Date Value Ref Range Status  10/11/2012 69 mL/min Final    Comment:      The estimated GFR is a calculation valid for adults (44 to 65 years old) that uses the CKD-EPI algorithm to adjust for age and sex. It is not to be used for children, pregnant women, hospitalized patients, patients on dialysis, or with rapidly changing kidney function. According to the NKDEP, eGFR >89 is normal, 60-89 shows mild impairment, 30-59 shows moderate impairment, 15-29 shows severe impairment and <15 is ESRD.   GFR calc non Af Amer  Date Value Ref Range Status  11/28/2018 69 >59 mL/min/1.73 Final         Passed - Valid encounter within last 6 months    Recent Outpatient Visits          1 month ago Prediabetes   Primary Care at [facility], [place], [place]   7 months ago Pain of joint of left ankle and foot   Primary Care at [facility], [name], [place]   10 months ago Essential hypertension   Primary Care at [place], [place]   10 months ago Pain of joint of left ankle and foot   [facility] Care at [facility], Laurey Arrow, [place]   11 months ago Annual physical exam   Primary Care at [facility], [place]urey Arrow, [place]      Future Appointments            In 4 months Sagardia, [name], MD Primary Care at [place], [facility]

## 2019-02-03 ENCOUNTER — Other Ambulatory Visit: Payer: Self-pay | Admitting: Family Medicine

## 2019-02-03 DIAGNOSIS — Z1231 Encounter for screening mammogram for malignant neoplasm of breast: Secondary | ICD-10-CM

## 2019-03-28 ENCOUNTER — Ambulatory Visit
Admission: RE | Admit: 2019-03-28 | Discharge: 2019-03-28 | Disposition: A | Discharge status: Home / Self Care | Payer: BC Managed Care – PPO | Source: Ambulatory Visit | Attending: Internal Medicine | Admitting: Internal Medicine

## 2019-03-28 ENCOUNTER — Other Ambulatory Visit: Payer: Self-pay

## 2019-03-28 DIAGNOSIS — Z1231 Encounter for screening mammogram for malignant neoplasm of breast: Secondary | ICD-10-CM

## 2019-03-29 ENCOUNTER — Ambulatory Visit: Payer: BC Managed Care – PPO

## 2019-04-25 LAB — HM DIABETES EYE EXAM

## 2019-04-29 ENCOUNTER — Encounter: Payer: Self-pay | Admitting: Emergency Medicine

## 2019-05-03 ENCOUNTER — Encounter: Payer: Self-pay | Admitting: *Deleted

## 2019-06-06 ENCOUNTER — Other Ambulatory Visit: Payer: Self-pay

## 2019-06-06 ENCOUNTER — Encounter: Payer: Self-pay | Admitting: Emergency Medicine

## 2019-06-06 ENCOUNTER — Ambulatory Visit (INDEPENDENT_AMBULATORY_CARE_PROVIDER_SITE_OTHER): Payer: Medicare Other | Admitting: Emergency Medicine

## 2019-06-06 VITALS — BP 127/79 | HR 76 | Temp 98.3°F | Resp 16 | Ht 65.5 in | Wt 232.2 lb

## 2019-06-06 DIAGNOSIS — E1159 Type 2 diabetes mellitus with other circulatory complications: Secondary | ICD-10-CM

## 2019-06-06 DIAGNOSIS — M1990 Unspecified osteoarthritis, unspecified site: Secondary | ICD-10-CM | POA: Diagnosis not present

## 2019-06-06 DIAGNOSIS — I10 Essential (primary) hypertension: Secondary | ICD-10-CM | POA: Diagnosis not present

## 2019-06-06 DIAGNOSIS — Z23 Encounter for immunization: Secondary | ICD-10-CM | POA: Diagnosis not present

## 2019-06-06 DIAGNOSIS — I152 Hypertension secondary to endocrine disorders: Secondary | ICD-10-CM

## 2019-06-06 MED ORDER — METFORMIN HCL 500 MG PO TABS: 500.0000 mg | ORAL_TABLET | Freq: Two times a day (BID) | ORAL | 3 refills | Status: DC

## 2019-06-06 MED ORDER — ONETOUCH DELICA PLUS LANCET30G MISC: 1.0000 | Freq: Three times a day (TID) | 3 refills | Status: DC | PRN

## 2019-06-06 NOTE — Progress Notes (Signed)
Lab Results  Component Value Date   HGBA1C 5.7 (H) 11/28/2018   BP Readings from Last 3 Encounters:  06/06/19 127/79  11/28/18 (!) 150/89  06/20/18 (!) 154/92   Lab Results  Component Value Date   CHOL 188 11/28/2018   HDL 51 11/28/2018   LDLCALC 116 (H) 11/28/2018   LDLDIRECT 113 (H) 10/11/2012   TRIG 104 11/28/2018   CHOLHDL 3.7 11/28/2018   Lab Results  Component Value Date   CREATININE 0.89 11/28/2018   BUN 13 11/28/2018   NA 143 11/28/2018   K 4.2 11/28/2018   CL 108 (H) 11/28/2018   CO2 21 11/28/2018   Katherine [name] 63 y.o.   Chief Complaint  Patient presents with  . Diabetes    follow up 6 month and lab work to check cholesterol thyroid  . Hypertension  . Medication Refill    Metformin, lancets    HISTORY OF PRESENT ILLNESS: This is a 63 y.o. female with history of hypertension, diabetes, and dyslipidemia here for follow-up and medication refill. 1.  Diabetes type 2: On metformin twice a day. 2.  Hypertension: On amlodipine 5 mg daily. 3.  Dyslipidemia: On Crestor 10 mg daily. Has no complaints or medical concerns today.  HPI   Prior to Admission medications   Medication Sig Start Date End Date Taking? Authorizing Provider  amLODipine (NORVASC) 5 MG tablet Take 1 tablet (5 mg total) by mouth daily. 06/20/18  Yes SagardiaInes Bloomer, MD  aspirin EC 81 MG tablet Take 81 mg by mouth daily.   Yes [provider]  Biotin 1000 MCG tablet Take 1,000 mcg by mouth 3 (three) times daily.   Yes [provider]  Calcium Carb-Cholecalciferol (CALCIUM 600-D PO) Take by mouth.   Yes [provider]  Cholecalciferol (VITAMIN D3 PO) Take by mouth.   Yes [provider]  metFORMIN (GLUCOPHAGE) 500 MG tablet Take 1 tablet (500 mg total) by mouth 2 (two) times daily with a meal. 06/06/19  Yes Lebron Nauert, [name], MD  Multiple Vitamin (MULTIVITAMIN) tablet Take 1 tablet by mouth daily.   Yes [provider]  rosuvastatin  (CRESTOR) 10 MG tablet Take 1 tablet (10 mg total) by mouth daily. 11/28/18  Yes Micayla Brathwaite, [name], MD  vitamin B-12 (CYANOCOBALAMIN) 1000 MCG tablet Take 1,000 mcg by mouth daily.   Yes [provider]  blood glucose meter kit and supplies KIT Dispense based on patient and insurance preference. Use up to four times daily as directed. (FOR ICD-9 250.00, 250.01). 06/20/18   [name], MD  diclofenac (VOLTAREN) 75 MG EC tablet Take 1 tablet (75 mg total) by mouth 2 (two) times daily. Patient not taking: Reported on 06/06/2019 03/14/18   Shawnee Knapp, MD  furosemide (LASIX) 20 MG tablet Take 1 tablet (20 mg total) by mouth daily. 03/07/18   Shawnee Knapp, MD  glucose blood (ONE TOUCH ULTRA TEST) test strip USE  STRIP TO CHECK GLUCOSE 4 TIMES DAILY AS DIRECTED 11/28/18   Zaim Nitta, [name], MD  Lancets [facility] DELICA PLUS [id number]) MISC Inject 1 Stick into the skin 3 (three) times daily as needed. 06/06/19 09/04/19  [name], MD  ranitidine (ZANTAC) 150 MG capsule Take 150 mg by mouth as needed for heartburn.    [provider]  traZODone (DESYREL) 50 MG tablet TAKE 1/2 TO 1 (ONE-HALF TO ONE) TABLET BY MOUTH AT BEDTIME AS NEEDED FOR SLEEP Patient not taking: Reported on 06/06/2019 11/15/18  Rutherford Guys, MD    Allergies  Allergen Reactions  . Ace Inhibitors Cough  . Egg Yolk Other (See Comments)    vomiting  . Adhesive [Tape] Rash    Patient Active Problem List   Diagnosis Date Noted  . Osteoarthritis of spine with radiculopathy, cervical region 11/30/2017  . Other cervical disc degeneration, unspecified cervical region 07/17/2017  . Arthritis 06/23/2014  . Prediabetes 10/20/2013  . Obesity (BMI 35.0-39.9 without comorbidity) 10/20/2013  . Hypertension     Past Medical History:  Diagnosis Date  . Diabetes mellitus without complication ([place])   . Hypertension     Past Surgical History:  Procedure Laterality Date  . APPENDECTOMY    .  CHOLECYSTECTOMY    . rotator cuff surgery Right 07/2014    Social History   Socioeconomic History  . Marital status: Married    Spouse name: Not on file  . Number of children: 2  . Years of education: Masters   . Highest education level: Not on file  Occupational History  . Occupation: Pharmacist, hospital  Social Needs  . Financial resource strain: Not on file  . Food insecurity    Worry: Not on file    Inability: Not on file  . Transportation needs    Medical: Not on file    Non-medical: Not on file  Tobacco Use  . Smoking status: Never Smoker  . Smokeless tobacco: Never Used  Substance and Sexual Activity  . Alcohol use: Yes    Alcohol/week: 0.0 standard drinks    Comment: social  . Drug use: No  . Sexual activity: Yes  Lifestyle  . Physical activity    Days per week: Not on file    Minutes per session: Not on file  . Stress: Not on file  Relationships  . Social Herbalist on phone: Not on file    Gets together: Not on file    Attends religious service: Not on file    Active member of club or organization: Not on file    Attends meetings of clubs or organizations: Not on file    Relationship status: Not on file  . Intimate partner violence    Fear of current or ex partner: Not on file    Emotionally abused: Not on file    Physically abused: Not on file    Forced sexual activity: Not on file  Other Topics Concern  . Not on file  Social History Narrative   Patient has had 2 pregnancies and has 2 living children.    Family History  Problem Relation Age of Onset  . Diabetes Father   . Hypertension Father   . Sickle cell anemia Brother   . Cancer Brother 15       Leukemia, passed away at 40  . Cancer Maternal Grandmother        breast cancer  . Breast cancer Maternal Grandmother      Review of Systems  Constitutional: Negative.  Negative for chills and fever.  HENT: Negative.  Negative for congestion and sore throat.   Eyes: Negative.    Cardiovascular: Negative.  Negative for chest pain and palpitations.  Gastrointestinal: Negative.  Negative for abdominal pain, blood in stool, diarrhea, melena, nausea and vomiting.  Genitourinary: Negative.   Skin: Negative.  Negative for rash.  Neurological: Negative.  Negative for dizziness and headaches.  Endo/Heme/Allergies: Negative.   All other systems reviewed and are negative.   Today's Vitals   06/06/19  1410  BP: 127/79  Pulse: 76  Resp: 16  Temp: 98.3 F (36.8 C)  TempSrc: Oral  SpO2: 99%  Weight: 232 lb 3.2 oz (105.3 kg)  Height: 5' 5.5" (1.664 m)   Body mass index is 38.05 kg/m.  Physical Exam Vitals signs reviewed.  Constitutional:      Appearance: Normal appearance.  HENT:     Head: Normocephalic and atraumatic.  Eyes:     Extraocular Movements: Extraocular movements intact.     Conjunctiva/sclera: Conjunctivae normal.     Pupils: Pupils are equal, round, and reactive to light.  Neck:     Musculoskeletal: Normal range of motion and neck supple.  Cardiovascular:     Rate and Rhythm: Normal rate and regular rhythm.     Pulses: Normal pulses.     Heart sounds: Normal heart sounds.  Pulmonary:     Effort: Pulmonary effort is normal.     Breath sounds: Normal breath sounds.  Musculoskeletal: Normal range of motion.  Skin:    General: Skin is warm and dry.     Capillary Refill: Capillary refill takes less than 2 seconds.  Neurological:     General: No focal deficit present.     Mental Status: She is alert and oriented to person, place, and time.  Psychiatric:        Mood and Affect: Mood normal.        Behavior: Behavior normal.      ASSESSMENT & PLAN: Hypertension associated with diabetes ([place]) Clinically stable.  No issues identified.  Continue present medications.  [name] in 6 months.  Katherine [name] was seen today for diabetes, hypertension and medication refill.  Diagnoses and all orders for this visit:  Hypertension associated with  diabetes ([place]) -     CBC with Differential/Platelet -     Comprehensive metabolic panel -     Hemoglobin A1c -     Lipid panel -     Thyroid Panel With TSH -     HM Diabetes Foot Exam -     Pneumococcal conjugate vaccine 13-valent IM -     Flu Vaccine QUAD 36+ mos IM -     DG Bone Density; Future -     metFORMIN (GLUCOPHAGE) 500 MG tablet; Take 1 tablet (500 mg total) by mouth 2 (two) times daily with a meal. -     Lancets (ONETOUCH DELICA PLUS [id number]) MISC; Inject 1 Stick into the skin 3 (three) times daily as needed.  Arthritis    Patient Instructions  Diabetes Mellitus and Nutrition, Adult When you have diabetes (diabetes mellitus), it is very important to have healthy eating habits because your blood sugar (glucose) levels are greatly affected by what you eat and drink. Eating healthy foods in the appropriate amounts, at about the same times every day, can help you:  Control your blood glucose.  Lower your risk of heart disease.  Improve your blood pressure.  Reach or maintain a healthy weight. Every person with diabetes is different, and each person has different needs for a meal plan. Your health care provider may recommend that you work with a diet and nutrition specialist (dietitian) to make a meal plan that is best for you. Your meal plan may vary depending on factors such as:  The calories you need.  The medicines you take.  Your weight.  Your blood glucose, blood pressure, and cholesterol levels.  Your activity level.  Other health conditions you have, such as heart or kidney disease.  How do carbohydrates affect me? Carbohydrates, also called carbs, affect your blood glucose level more than any other type of food. Eating carbs naturally raises the amount of glucose in your blood. Carb counting is a method for keeping track of how many carbs you eat. Counting carbs is important to keep your blood glucose at a healthy level, especially if you use insulin or take  certain oral diabetes medicines. It is important to know how many carbs you can safely have in each meal. This is different for every person. Your dietitian can help you calculate how many carbs you should have at each meal and for each snack. Foods that contain carbs include:  Bread, cereal, rice, pasta, and crackers.  Potatoes and corn.  Peas, beans, and lentils.  Milk and yogurt.  Fruit and juice.  Desserts, such as cakes, cookies, ice cream, and candy. How does alcohol affect me? Alcohol can cause a sudden decrease in blood glucose (hypoglycemia), especially if you use insulin or take certain oral diabetes medicines. Hypoglycemia can be a life-threatening condition. Symptoms of hypoglycemia (sleepiness, dizziness, and confusion) are similar to symptoms of having too much alcohol. If your health care provider says that alcohol is safe for you, follow these guidelines:  Limit alcohol intake to no more than 1 drink per day for nonpregnant women and 2 drinks per day for men. One drink equals 12 oz of beer, 5 oz of wine, or 1 oz of hard liquor.  Do not drink on an empty stomach.  Keep yourself hydrated with water, diet soda, or unsweetened iced tea.  Keep in mind that regular soda, juice, and other mixers may contain a lot of sugar and must be counted as carbs. What are tips for following this plan?  Reading food labels  Start by checking the serving size on the "Nutrition Facts" label of packaged foods and drinks. The amount of calories, carbs, fats, and other nutrients listed on the label is based on one serving of the item. Many items contain more than one serving per package.  Check the total grams (g) of carbs in one serving. You can calculate the number of servings of carbs in one serving by dividing the total carbs by 15. For example, if a food has 30 g of total carbs, it would be equal to 2 servings of carbs.  Check the number of grams (g) of saturated and trans fats in one  serving. Choose foods that have low or no amount of these fats.  Check the number of milligrams (mg) of salt (sodium) in one serving. Most people should limit total sodium intake to less than 2,300 mg per day.  Always check the nutrition information of foods labeled as "low-fat" or "nonfat". These foods may be higher in added sugar or refined carbs and should be avoided.  Talk to your dietitian to identify your daily goals for nutrients listed on the label. Shopping  Avoid buying canned, premade, or processed foods. These foods tend to be high in fat, sodium, and added sugar.  Shop around the outside edge of the grocery store. This includes fresh fruits and vegetables, bulk grains, fresh meats, and fresh dairy. Cooking  Use low-heat cooking methods, such as baking, instead of high-heat cooking methods like deep frying.  Cook using healthy oils, such as olive, canola, or sunflower oil.  Avoid cooking with butter, cream, or high-fat meats. Meal planning  Eat meals and snacks regularly, preferably at the same times every day. Avoid going  long periods of time without eating.  Eat foods high in fiber, such as fresh fruits, vegetables, beans, and whole grains. Talk to your dietitian about how many servings of carbs you can eat at each meal.  Eat 4-6 ounces (oz) of lean protein each day, such as lean meat, chicken, fish, eggs, or tofu. One oz of lean protein is equal to: ? 1 oz of meat, chicken, or fish. ? 1 egg. ?  cup of tofu.  Eat some foods each day that contain healthy fats, such as avocado, nuts, seeds, and fish. Lifestyle  Check your blood glucose regularly.  Exercise regularly as told by your health care provider. This may include: ? 150 minutes of moderate-intensity or vigorous-intensity exercise each week. This could be brisk walking, biking, or water aerobics. ? Stretching and doing strength exercises, such as yoga or weightlifting, at least 2 times a week.  Take medicines  as told by your health care provider.  Do not use any products that contain nicotine or tobacco, such as cigarettes and e-cigarettes. If you need help quitting, ask your health care provider.  Work with a Social worker or diabetes educator to identify strategies to manage stress and any emotional and social challenges. Questions to ask a health care provider  Do I need to meet with a diabetes educator?  Do I need to meet with a dietitian?  What number can I call if I have questions?  When are the best times to check my blood glucose? Where to find more information:  American Diabetes Association: diabetes.org  Academy of Nutrition and Dietetics: www.eatright.[employer] of Diabetes and Digestive and Kidney Diseases (NIH): [employer] Summary  A healthy meal plan will help you control your blood glucose and maintain a healthy lifestyle.  Working with a diet and nutrition specialist (dietitian) can help you make a meal plan that is best for you.  Keep in mind that carbohydrates (carbs) and alcohol have immediate effects on your blood glucose levels. It is important to count carbs and to use alcohol carefully. This information is not intended to replace advice given to you by your health care provider. Make sure you discuss any questions you have with your health care provider. Document Released: 06/11/2005 Document Revised: 08/27/2017 Document Reviewed: 10/19/2016 Elsevier Patient Education  2020 Elsevier Inc.      Agustina Caroli, MD [facility]

## 2019-06-06 NOTE — Patient Instructions (Signed)

## 2019-06-06 NOTE — Assessment & Plan Note (Signed)
Clinically stable.  No issues identified.  Continue present medications.  [name] in 6 months.

## 2019-06-07 ENCOUNTER — Encounter: Payer: Self-pay | Admitting: Emergency Medicine

## 2019-06-07 LAB — CBC WITH DIFFERENTIAL/PLATELET
Basophils Absolute: 0.1 10*3/uL (ref 0.0–0.2)
Basos: 1 %
EOS (ABSOLUTE): 0.2 10*3/uL (ref 0.0–0.4)
Eos: 3 %
Hematocrit: 40.5 % (ref 34.0–46.6)
Hemoglobin: 13.6 g/dL (ref 11.1–15.9)
Immature Grans (Abs): 0 10*3/uL (ref 0.0–0.1)
Immature Granulocytes: 1 %
Lymphocytes Absolute: 1.3 10*3/uL (ref 0.7–3.1)
Lymphs: 22 %
MCH: 32.2 pg (ref 26.6–33.0)
MCHC: 33.6 g/dL (ref 31.5–35.7)
MCV: 96 fL (ref 79–97)
Monocytes Absolute: 0.5 10*3/uL (ref 0.1–0.9)
Monocytes: 9 %
Neutrophils Absolute: 3.8 10*3/uL (ref 1.4–7.0)
Neutrophils: 64 %
Platelets: 183 10*3/uL (ref 150–450)
RBC: 4.22 x10E6/uL (ref 3.77–5.28)
RDW: 12.8 % (ref 11.7–15.4)
WBC: 5.9 10*3/uL (ref 3.4–10.8)

## 2019-06-07 LAB — THYROID PANEL WITH TSH
Free Thyroxine Index: 1.9 (ref 1.2–4.9)
T3 Uptake Ratio: 31 % (ref 24–39)
T4, Total: 6.1 ug/dL (ref 4.5–12.0)
TSH: 1.58 u[IU]/mL (ref 0.450–4.500)

## 2019-06-07 LAB — LIPID PANEL
Chol/HDL Ratio: 2.2 ratio (ref 0.0–4.4)
Cholesterol, Total: 111 mg/dL (ref 100–199)
HDL: 50 mg/dL (ref >39)
LDL Chol Calc (NIH): 42 mg/dL (ref 0–99)
Triglycerides: 104 mg/dL (ref 0–149)
VLDL Cholesterol Cal: 19 mg/dL (ref 5–40)

## 2019-06-07 LAB — HEMOGLOBIN A1C
Est. average glucose Bld gHb Est-mCnc: 111 mg/dL
Hgb A1c MFr Bld: 5.5 % (ref 4.8–5.6)

## 2019-06-07 LAB — COMPREHENSIVE METABOLIC PANEL
ALT: 20 IU/L (ref 0–32)
AST: 21 IU/L (ref 0–40)
Albumin/Globulin Ratio: 1.5 (ref 1.2–2.2)
Albumin: 4.5 g/dL (ref 3.8–4.8)
Alkaline Phosphatase: 98 IU/L (ref 39–117)
BUN/Creatinine Ratio: 17 (ref 12–28)
BUN: 14 mg/dL (ref 8–27)
Bilirubin Total: 0.4 mg/dL (ref 0.0–1.2)
CO2: 24 mmol/L (ref 20–29)
Calcium: 9.6 mg/dL (ref 8.7–10.3)
Chloride: 102 mmol/L (ref 96–106)
Creatinine, Ser: 0.82 mg/dL (ref 0.57–1.00)
GFR calc Af Amer: 87 mL/min/{1.73_m2} (ref >59)
GFR calc non Af Amer: 75 mL/min/{1.73_m2} (ref >59)
Globulin, Total: 3 g/dL (ref 1.5–4.5)
Glucose: 89 mg/dL (ref 65–99)
Potassium: 4.5 mmol/L (ref 3.5–5.2)
Sodium: 141 mmol/L (ref 134–144)
Total Protein: 7.5 g/dL (ref 6.0–8.5)

## 2019-06-19 ENCOUNTER — Telehealth: Payer: Self-pay | Admitting: Emergency Medicine

## 2019-06-19 NOTE — Telephone Encounter (Signed)
Pt has a questions concerning an order placed for a bone density test that was to be scheduled. She needs this because it correlates with her up coming cpe. Please advise at  [phone]

## 2019-06-21 NOTE — Telephone Encounter (Signed)
Reason for CRM: pt following up on order for her bone density, the order was not sent anywhere. Pt needs her bone density order sent to [facility] , the breast center. Also the breast center advised the dx code the dr put on the order is not covered by medicare, it will need to be one of the following dx: 1.  Screening for osteopetrosis 2. Osteopenia 3. Fractures or breaks 4. Estrogen deficiency 5. Hypoparathyroidism thyroidism   Once this is sent and correct dx code applied, [facility] will get the pt scheduled.  Pt was hoping to get this done prior to her cpe on 9/28

## 2019-06-22 ENCOUNTER — Encounter: Payer: Self-pay | Admitting: Emergency Medicine

## 2019-06-26 ENCOUNTER — Encounter: Payer: Self-pay | Admitting: Emergency Medicine

## 2019-06-26 ENCOUNTER — Other Ambulatory Visit: Payer: Self-pay

## 2019-06-26 ENCOUNTER — Ambulatory Visit (INDEPENDENT_AMBULATORY_CARE_PROVIDER_SITE_OTHER): Payer: Medicare Other | Admitting: Emergency Medicine

## 2019-06-26 VITALS — BP 118/78 | HR 86 | Temp 98.6°F | Resp 16 | Ht 65.0 in | Wt 232.0 lb

## 2019-06-26 DIAGNOSIS — Z0001 Encounter for general adult medical examination with abnormal findings: Secondary | ICD-10-CM

## 2019-06-26 DIAGNOSIS — E1159 Type 2 diabetes mellitus with other circulatory complications: Secondary | ICD-10-CM | POA: Diagnosis not present

## 2019-06-26 DIAGNOSIS — I10 Essential (primary) hypertension: Secondary | ICD-10-CM

## 2019-06-26 DIAGNOSIS — I152 Hypertension secondary to endocrine disorders: Secondary | ICD-10-CM

## 2019-06-26 DIAGNOSIS — Z0000 Encounter for general adult medical examination without abnormal findings: Secondary | ICD-10-CM

## 2019-06-26 MED ORDER — AMLODIPINE BESYLATE 5 MG PO TABS: 5.0000 mg | ORAL_TABLET | Freq: Every day | ORAL | 3 refills | Status: DC

## 2019-06-26 NOTE — Progress Notes (Signed)
BP Readings from Last 3 Encounters:  06/26/19 118/78  06/06/19 127/79  11/28/18 (!) 150/89   Oma D Yung 63 y.o.   Chief Complaint  Patient presents with  . Annual Exam    HISTORY OF PRESENT ILLNESS: This is a 63 y.o. female here for her annual exam. Has no complaints or medical concerns. Seen by me on 06/06/2019. Has history of hypertension, diabetes, and osteoarthritis.  Doing well and under control. Health maintenance reviewed with patient.  Up-to-date. Recent blood work reviewed with patient.  CBC, CMP, thyroid panel, lipid profile, A1c, all within normal limits.  HPI   Prior to Admission medications   Medication Sig Start Date End Date Taking? Authorizing Provider  amLODipine (NORVASC) 5 MG tablet Take 1 tablet (5 mg total) by mouth daily. 06/20/18  Yes SagardiaInes Bloomer, MD  aspirin EC 81 MG tablet Take 81 mg by mouth daily.   Yes [provider]  Biotin 1000 MCG tablet Take 1,000 mcg by mouth 3 (three) times daily.   Yes [provider]  Calcium Carb-Cholecalciferol (CALCIUM 600-D PO) Take by mouth.   Yes [provider]  Cholecalciferol (VITAMIN D3 PO) Take by mouth.   Yes [provider]  glucose blood (ONE TOUCH ULTRA TEST) test strip USE  STRIP TO CHECK GLUCOSE 4 TIMES DAILY AS DIRECTED 11/28/18  Yes Zenaya Ulatowski, [name], MD  metFORMIN (GLUCOPHAGE) 500 MG tablet Take 1 tablet (500 mg total) by mouth 2 (two) times daily with a meal. 06/06/19  Yes Malinda Mayden, [name], MD  Multiple Vitamin (MULTIVITAMIN) tablet Take 1 tablet by mouth daily.   Yes [provider]  rosuvastatin (CRESTOR) 10 MG tablet Take 1 tablet (10 mg total) by mouth daily. 11/28/18  Yes Andrea Ferrer, [name], MD  vitamin B-12 (CYANOCOBALAMIN) 1000 MCG tablet Take 1,000 mcg by mouth daily.   Yes [provider]  blood glucose meter kit and supplies KIT Dispense based on patient and insurance preference. Use up to four times daily as directed.  (FOR ICD-9 250.00, 250.01). 06/20/18   [name], MD  diclofenac (VOLTAREN) 75 MG EC tablet Take 1 tablet (75 mg total) by mouth 2 (two) times daily. Patient not taking: Reported on 06/06/2019 03/14/18   Shawnee Knapp, MD  furosemide (LASIX) 20 MG tablet Take 1 tablet (20 mg total) by mouth daily. 03/07/18   Shawnee Knapp, MD  Lancets [facility] DELICA PLUS ATFTDD22G) MISC Inject 1 Stick into the skin 3 (three) times daily as needed. 06/06/19 09/04/19  [name], MD  ranitidine (ZANTAC) 150 MG capsule Take 150 mg by mouth as needed for heartburn.    [provider]  traZODone (DESYREL) 50 MG tablet TAKE 1/2 TO 1 (ONE-HALF TO ONE) TABLET BY MOUTH AT BEDTIME AS NEEDED FOR SLEEP Patient not taking: Reported on 06/06/2019 11/15/18   Rutherford Guys, MD    Allergies  Allergen Reactions  . Ace Inhibitors Cough  . Egg Yolk Other (See Comments)    vomiting  . Adhesive [Tape] Rash    Patient Active Problem List   Diagnosis Date Noted  . Osteoarthritis of spine with radiculopathy, cervical region 11/30/2017  . Other cervical disc degeneration, unspecified cervical region 07/17/2017  . Arthritis 06/23/2014  . Prediabetes 10/20/2013  . Obesity (BMI 35.0-39.9 without comorbidity) 10/20/2013  . Hypertension associated with diabetes [facility])     Past Medical History:  Diagnosis Date  . Diabetes mellitus without complication ([place])   . Hypertension  Past Surgical History:  Procedure Laterality Date  . APPENDECTOMY    . CHOLECYSTECTOMY    . rotator cuff surgery Right 07/2014    Social History   Socioeconomic History  . Marital status: Married    Spouse name: Not on file  . Number of children: 2  . Years of education: Masters   . Highest education level: Not on file  Occupational History  . Occupation: Pharmacist, hospital  Social Needs  . Financial resource strain: Not on file  . Food insecurity    Worry: Not on file    Inability: Not on file  . Transportation needs     Medical: Not on file    Non-medical: Not on file  Tobacco Use  . Smoking status: Never Smoker  . Smokeless tobacco: Never Used  Substance and Sexual Activity  . Alcohol use: Yes    Alcohol/week: 0.0 standard drinks    Comment: social  . Drug use: No  . Sexual activity: Yes  Lifestyle  . Physical activity    Days per week: Not on file    Minutes per session: Not on file  . Stress: Not on file  Relationships  . Social Herbalist on phone: Not on file    Gets together: Not on file    Attends religious service: Not on file    Active member of club or organization: Not on file    Attends meetings of clubs or organizations: Not on file    Relationship status: Not on file  . Intimate partner violence    Fear of current or ex partner: Not on file    Emotionally abused: Not on file    Physically abused: Not on file    Forced sexual activity: Not on file  Other Topics Concern  . Not on file  Social History Narrative   Patient has had 2 pregnancies and has 2 living children.    Family History  Problem Relation Age of Onset  . Diabetes Father   . Hypertension Father   . Sickle cell anemia Brother   . Cancer Brother 15       Leukemia, passed away at 54  . Cancer Maternal Grandmother        breast cancer  . Breast cancer Maternal Grandmother      Review of Systems  Constitutional: Negative.  Negative for chills and fever.  HENT: Negative.  Negative for congestion and sore throat.   Eyes: Negative.   Respiratory: Negative.  Negative for cough and shortness of breath.   Cardiovascular: Negative.  Negative for chest pain, palpitations and leg swelling.  Gastrointestinal: Negative.  Negative for abdominal pain, diarrhea, nausea and vomiting.  Genitourinary: Negative.   Musculoskeletal: Negative.   Skin: Negative.  Negative for rash.  Neurological: Negative.  Negative for dizziness.  Endo/Heme/Allergies: Negative.   All other systems reviewed and are  negative.   Vitals:   06/26/19 0928  BP: 118/78  Pulse: 86  Resp: 16  Temp: 98.6 F (37 C)  SpO2: 97%    Physical Exam Vitals signs reviewed.  Constitutional:      Appearance: Normal appearance.  HENT:     Head: Normocephalic.  Eyes:     Extraocular Movements: Extraocular movements intact.     Pupils: Pupils are equal, round, and reactive to light.  Neck:     Musculoskeletal: Normal range of motion and neck supple.  Cardiovascular:     Rate and Rhythm: Normal rate and regular rhythm.  Pulses: Normal pulses.     Heart sounds: Normal heart sounds.  Pulmonary:     Effort: Pulmonary effort is normal.     Breath sounds: Normal breath sounds.  Abdominal:     Palpations: Abdomen is soft.     Tenderness: There is no abdominal tenderness.  Musculoskeletal: Normal range of motion.  Skin:    General: Skin is warm and dry.     Capillary Refill: Capillary refill takes less than 2 seconds.  Neurological:     General: No focal deficit present.     Mental Status: She is alert and oriented to person, place, and time.  Psychiatric:        Mood and Affect: Mood normal.        Behavior: Behavior normal.      ASSESSMENT & PLAN: Madilynne was seen today for annual exam.  Diagnoses and all orders for this visit:  Routine general medical examination at a health care facility  Essential hypertension -     amLODipine (NORVASC) 5 MG tablet; Take 1 tablet (5 mg total) by mouth daily.  Hypertension associated with diabetes [facility])    Patient Instructions       If you have lab work done today you will be contacted with your lab results within the next 2 weeks.  If you have not heard from [place] then please contact us. The fastest way to get your results is to register for My Chart.   IF you received an x-ray today, you will receive an invoice from [facility]. Please contact [facility] at [phone] with questions or concerns regarding your invoice.   IF you  received labwork today, you will receive an invoice from [place]. Please contact [facility] at [phone] with questions or concerns regarding your invoice.   Our billing staff will not be able to assist you with questions regarding bills from these companies.  You will be contacted with the lab results as soon as they are available. The fastest way to get your results is to activate your My Chart account. Instructions are located on the last page of this paperwork. If you have not heard from [place] regarding the results in 2 weeks, please contact this office.      Health Maintenance, Female Adopting a healthy lifestyle and getting preventive care are important in promoting health and wellness. Ask your health care provider about:  The right schedule for you to have regular tests and exams.  Things you can do on your own to prevent diseases and keep yourself healthy. What should I know about diet, weight, and exercise? Eat a healthy diet   Eat a diet that includes plenty of vegetables, fruits, low-fat dairy products, and lean protein.  Do not eat a lot of foods that are high in solid fats, added sugars, or sodium. Maintain a healthy weight Body mass index (BMI) is used to identify weight problems. It estimates body fat based on height and weight. Your health care provider can help determine your BMI and help you achieve or maintain a healthy weight. Get regular exercise Get regular exercise. This is one of the most important things you can do for your health. Most adults should:  Exercise for at least 150 minutes each week. The exercise should increase your heart rate and make you sweat (moderate-intensity exercise).  Do strengthening exercises at least twice a week. This is in addition to the moderate-intensity exercise.  Spend less time sitting. Even light physical activity can be beneficial.  Watch cholesterol and blood lipids Have your blood tested for lipids and cholesterol at 65  years of age, then have this test every 5 years. Have your cholesterol levels checked more often if:  Your lipid or cholesterol levels are high.  You are older than 65 years of age.  You are at high risk for heart disease. What should I know about cancer screening? Depending on your health history and family history, you may need to have cancer screening at various ages. This may include screening for:  Breast cancer.  Cervical cancer.  Colorectal cancer.  Skin cancer.  Lung cancer. What should I know about heart disease, diabetes, and high blood pressure? Blood pressure and heart disease  High blood pressure causes heart disease and increases the risk of stroke. This is more likely to develop in people who have high blood pressure readings, are of African descent, or are overweight.  Have your blood pressure checked: ? Every 3-5 years if you are 76-38 years of age. ? Every year if you are 73 years old or older. Diabetes Have regular diabetes screenings. This checks your fasting blood sugar level. Have the screening done:  Once every three years after age 63 if you are at a normal weight and have a low risk for diabetes.  More often and at a younger age if you are overweight or have a high risk for diabetes. What should I know about preventing infection? Hepatitis B If you have a higher risk for hepatitis B, you should be screened for this virus. Talk with your health care provider to find out if you are at risk for hepatitis B infection. Hepatitis C Testing is recommended for:  Everyone born from [birth year] through 1965.  Anyone with known risk factors for hepatitis C. Sexually transmitted infections (STIs)  Get screened for STIs, including gonorrhea and chlamydia, if: ? You are sexually active and are younger than 65 years of age. ? You are older than 65 years of age and your health care provider tells you that you are at risk for this type of infection. ? Your sexual  activity has changed since you were last screened, and you are at increased risk for chlamydia or gonorrhea. Ask your health care provider if you are at risk.  Ask your health care provider about whether you are at high risk for HIV. Your health care provider may recommend a prescription medicine to help prevent HIV infection. If you choose to take medicine to prevent HIV, you should first get tested for HIV. You should then be tested every 3 months for as long as you are taking the medicine. Pregnancy  If you are about to stop having your period (premenopausal) and you may become pregnant, seek counseling before you get pregnant.  Take 400 to 800 micrograms (mcg) of folic acid every day if you become pregnant.  Ask for birth control (contraception) if you want to prevent pregnancy. Osteoporosis and menopause Osteoporosis is a disease in which the bones lose minerals and strength with aging. This can result in bone fractures. If you are 72 years old or older, or if you are at risk for osteoporosis and fractures, ask your health care provider if you should:  Be screened for bone loss.  Take a calcium or vitamin D supplement to lower your risk of fractures.  Be given hormone replacement therapy (HRT) to treat symptoms of menopause. Follow these instructions at home: Lifestyle  Do not use any products that contain nicotine  or tobacco, such as cigarettes, e-cigarettes, and chewing tobacco. If you need help quitting, ask your health care provider.  Do not use street drugs.  Do not share needles.  Ask your health care provider for help if you need support or information about quitting drugs. Alcohol use  Do not drink alcohol if: ? Your health care provider tells you not to drink. ? You are pregnant, may be pregnant, or are planning to become pregnant.  If you drink alcohol: ? Limit how much you use to 0-1 drink a day. ? Limit intake if you are breastfeeding.  Be aware of how much  alcohol is in your drink. In the [place] one drink equals one 12 oz bottle of beer (355 mL), one 5 oz glass of wine (148 mL), or one 1 oz glass of hard liquor (44 mL). General instructions  Schedule regular health, dental, and eye exams.  Stay current with your vaccines.  Tell your health care provider if: ? You often feel depressed. ? You have ever been abused or do not feel safe at home. Summary  Adopting a healthy lifestyle and getting preventive care are important in promoting health and wellness.  Follow your health care provider's instructions about healthy diet, exercising, and getting tested or screened for diseases.  Follow your health care provider's instructions on monitoring your cholesterol and blood pressure. This information is not intended to replace advice given to you by your health care provider. Make sure you discuss any questions you have with your health care provider. Document Released: 03/30/2011 Document Revised: 09/07/2018 Document Reviewed: 09/07/2018 Elsevier Patient Education  2020 Elsevier Inc.      Agustina Caroli, MD [facility]

## 2019-06-26 NOTE — Patient Instructions (Addendum)
   If you have lab work done today you will be contacted with your lab results within the next 2 weeks.  If you have not heard from us then please contact us. The fastest way to get your results is to register for My Chart.   IF you received an x-ray today, you will receive an invoice from [facility]. Please contact  Radiology at [phone] with questions or concerns regarding your invoice.   IF you received labwork today, you will receive an invoice from [facility]. Please contact [facility] at [phone] with questions or concerns regarding your invoice.   Our billing staff will not be able to assist you with questions regarding bills from these companies.  You will be contacted with the lab results as soon as they are available. The fastest way to get your results is to activate your My Chart account. Instructions are located on the last page of this paperwork. If you have not heard from us regarding the results in 2 weeks, please contact this office.      Health Maintenance, Female Adopting a healthy lifestyle and getting preventive care are important in promoting health and wellness. Ask your health care provider about:  The right schedule for you to have regular tests and exams.  Things you can do on your own to prevent diseases and keep yourself healthy. What should I know about diet, weight, and exercise? Eat a healthy diet   Eat a diet that includes plenty of vegetables, fruits, low-fat dairy products, and lean protein.  Do not eat a lot of foods that are high in solid fats, added sugars, or sodium. Maintain a healthy weight Body mass index (BMI) is used to identify weight problems. It estimates body fat based on height and weight. Your health care provider can help determine your BMI and help you achieve or maintain a healthy weight. Get regular exercise Get regular exercise. This is one of the most important things you can do for your health. Most  adults should:  Exercise for at least 150 minutes each week. The exercise should increase your heart rate and make you sweat (moderate-intensity exercise).  Do strengthening exercises at least twice a week. This is in addition to the moderate-intensity exercise.  Spend less time sitting. Even light physical activity can be beneficial. Watch cholesterol and blood lipids Have your blood tested for lipids and cholesterol at 65 years of age, then have this test every 5 years. Have your cholesterol levels checked more often if:  Your lipid or cholesterol levels are high.  You are older than 65 years of age.  You are at high risk for heart disease. What should I know about cancer screening? Depending on your health history and family history, you may need to have cancer screening at various ages. This may include screening for:  Breast cancer.  Cervical cancer.  Colorectal cancer.  Skin cancer.  Lung cancer. What should I know about heart disease, diabetes, and high blood pressure? Blood pressure and heart disease  High blood pressure causes heart disease and increases the risk of stroke. This is more likely to develop in people who have high blood pressure readings, are of African descent, or are overweight.  Have your blood pressure checked: ? Every 3-5 years if you are 18-39 years of age. ? Every year if you are 40 years old or older. Diabetes Have regular diabetes screenings. This checks your fasting blood sugar level. Have the screening done:  Once every   three years after age 40 if you are at a normal weight and have a low risk for diabetes.  More often and at a younger age if you are overweight or have a high risk for diabetes. What should I know about preventing infection? Hepatitis B If you have a higher risk for hepatitis B, you should be screened for this virus. Talk with your health care provider to find out if you are at risk for hepatitis B infection. Hepatitis  C Testing is recommended for:  Everyone born from 1945 through 1965.  Anyone with known risk factors for hepatitis C. Sexually transmitted infections (STIs)  Get screened for STIs, including gonorrhea and chlamydia, if: ? You are sexually active and are younger than 65 years of age. ? You are older than 65 years of age and your health care provider tells you that you are at risk for this type of infection. ? Your sexual activity has changed since you were last screened, and you are at increased risk for chlamydia or gonorrhea. Ask your health care provider if you are at risk.  Ask your health care provider about whether you are at high risk for HIV. Your health care provider may recommend a prescription medicine to help prevent HIV infection. If you choose to take medicine to prevent HIV, you should first get tested for HIV. You should then be tested every 3 months for as long as you are taking the medicine. Pregnancy  If you are about to stop having your period (premenopausal) and you may become pregnant, seek counseling before you get pregnant.  Take 400 to 800 micrograms (mcg) of folic acid every day if you become pregnant.  Ask for birth control (contraception) if you want to prevent pregnancy. Osteoporosis and menopause Osteoporosis is a disease in which the bones lose minerals and strength with aging. This can result in bone fractures. If you are 65 years old or older, or if you are at risk for osteoporosis and fractures, ask your health care provider if you should:  Be screened for bone loss.  Take a calcium or vitamin D supplement to lower your risk of fractures.  Be given hormone replacement therapy (HRT) to treat symptoms of menopause. Follow these instructions at home: Lifestyle  Do not use any products that contain nicotine or tobacco, such as cigarettes, e-cigarettes, and chewing tobacco. If you need help quitting, ask your health care provider.  Do not use street  drugs.  Do not share needles.  Ask your health care provider for help if you need support or information about quitting drugs. Alcohol use  Do not drink alcohol if: ? Your health care provider tells you not to drink. ? You are pregnant, may be pregnant, or are planning to become pregnant.  If you drink alcohol: ? Limit how much you use to 0-1 drink a day. ? Limit intake if you are breastfeeding.  Be aware of how much alcohol is in your drink. In the [place] one drink equals one 12 oz bottle of beer (355 mL), one 5 oz glass of wine (148 mL), or one 1 oz glass of hard liquor (44 mL). General instructions  Schedule regular health, dental, and eye exams.  Stay current with your vaccines.  Tell your health care provider if: ? You often feel depressed. ? You have ever been abused or do not feel safe at home. Summary  Adopting a healthy lifestyle and getting preventive care are important in promoting health and wellness.    Follow your health care provider's instructions about healthy diet, exercising, and getting tested or screened for diseases.  Follow your health care provider's instructions on monitoring your cholesterol and blood pressure. This information is not intended to replace advice given to you by your health care provider. Make sure you discuss any questions you have with your health care provider. Document Released: 03/30/2011 Document Revised: 09/07/2018 Document Reviewed: 09/07/2018 Elsevier Patient Education  2020 Elsevier Inc.  

## 2019-06-28 ENCOUNTER — Encounter: Payer: Self-pay | Admitting: Emergency Medicine

## 2019-06-28 ENCOUNTER — Other Ambulatory Visit: Payer: Self-pay

## 2019-06-28 DIAGNOSIS — Z13820 Encounter for screening for osteoporosis: Secondary | ICD-10-CM

## 2019-06-28 NOTE — Telephone Encounter (Signed)
Referral has been sent.

## 2019-07-12 ENCOUNTER — Encounter: Payer: Self-pay | Admitting: Emergency Medicine

## 2019-07-12 ENCOUNTER — Ambulatory Visit
Admission: RE | Admit: 2019-07-12 | Discharge: 2019-07-12 | Disposition: A | Discharge status: Home / Self Care | Payer: Medicare Other | Source: Ambulatory Visit | Attending: Emergency Medicine | Admitting: Emergency Medicine

## 2019-07-12 ENCOUNTER — Other Ambulatory Visit: Payer: Self-pay

## 2019-07-12 DIAGNOSIS — Z13820 Encounter for screening for osteoporosis: Secondary | ICD-10-CM

## 2019-08-11 ENCOUNTER — Encounter: Payer: Self-pay | Admitting: Emergency Medicine

## 2019-10-14 ENCOUNTER — Encounter: Payer: Self-pay | Admitting: Emergency Medicine

## 2019-10-15 ENCOUNTER — Encounter: Payer: Self-pay | Admitting: Emergency Medicine

## 2019-10-19 ENCOUNTER — Ambulatory Visit: Payer: Medicare PPO | Admitting: Emergency Medicine

## 2019-10-19 ENCOUNTER — Other Ambulatory Visit: Payer: Self-pay

## 2019-10-19 ENCOUNTER — Encounter: Payer: Self-pay | Admitting: Emergency Medicine

## 2019-10-19 VITALS — BP 126/80 | HR 88 | Temp 97.7°F | Resp 16 | Ht 65.0 in | Wt 239.0 lb

## 2019-10-19 DIAGNOSIS — M1990 Unspecified osteoarthritis, unspecified site: Secondary | ICD-10-CM

## 2019-10-19 DIAGNOSIS — E1159 Type 2 diabetes mellitus with other circulatory complications: Secondary | ICD-10-CM | POA: Diagnosis not present

## 2019-10-19 DIAGNOSIS — I10 Essential (primary) hypertension: Secondary | ICD-10-CM | POA: Diagnosis not present

## 2019-10-19 DIAGNOSIS — Z6835 Body mass index (BMI) 35.0-35.9, adult: Secondary | ICD-10-CM

## 2019-10-19 DIAGNOSIS — E66812 Obesity, class 2: Secondary | ICD-10-CM | POA: Insufficient documentation

## 2019-10-19 DIAGNOSIS — M5440 Lumbago with sciatica, unspecified side: Secondary | ICD-10-CM | POA: Diagnosis not present

## 2019-10-19 MED ORDER — MELOXICAM 15 MG PO TABS: 15.0000 mg | ORAL_TABLET | Freq: Every day | ORAL | 1 refills | Status: DC

## 2019-10-19 MED ORDER — CYCLOBENZAPRINE HCL 10 MG PO TABS: 10.0000 mg | ORAL_TABLET | Freq: Every day | ORAL | 1 refills | Status: DC

## 2019-10-19 NOTE — Patient Instructions (Addendum)
   If you have lab work done today you will be contacted with your lab results within the next 2 weeks.  If you have not heard from us then please contact us. The fastest way to get your results is to register for My Chart.   IF you received an x-ray today, you will receive an invoice from [facility]. Please contact [facility] at [phone] with questions or concerns regarding your invoice.   IF you received labwork today, you will receive an invoice from [facility]. Please contact [facility] at [phone] with questions or concerns regarding your invoice.   Our billing staff will not be able to assist you with questions regarding bills from these companies.  You will be contacted with the lab results as soon as they are available. The fastest way to get your results is to activate your My Chart account. Instructions are located on the last page of this paperwork. If you have not heard from us regarding the results in 2 weeks, please contact this office.     Sciatica  Sciatica is pain, weakness, tingling, or loss of feeling (numbness) along the sciatic nerve. The sciatic nerve starts in the lower back and goes down the back of each leg. Sciatica usually goes away on its own or with treatment. Sometimes, sciatica may come back (recur). What are the causes? This condition happens when the sciatic nerve is pinched or has pressure put on it. This may be the result of:  A disk in between the bones of the spine bulging out too far (herniated disk).  Changes in the spinal disks that occur with aging.  A condition that affects a muscle in the butt.  Extra bone growth near the sciatic nerve.  A break (fracture) of the area between your hip bones (pelvis).  Pregnancy.  Tumor. This is rare. What increases the risk? You are more likely to develop this condition if you:  Play sports that put pressure or stress on the spine.  Have poor strength and ease of movement  (flexibility).  Have had a back injury in the past.  Have had back surgery.  Sit for long periods of time.  Do activities that involve bending or lifting over and over again.  Are very overweight (obese). What are the signs or symptoms? Symptoms can vary from mild to very bad. They may include:  Any of these problems in the lower back, leg, hip, or butt: ? Mild tingling, loss of feeling, or dull aches. ? Burning sensations. ? Sharp pains.  Loss of feeling in the back of the calf or the sole of the foot.  Leg weakness.  Very bad back pain that makes it hard to move. These symptoms may get worse when you cough, sneeze, or laugh. They may also get worse when you sit or stand for long periods of time. How is this treated? This condition often gets better without any treatment. However, treatment may include:  Changing or cutting back on physical activity when you have pain.  Doing exercises and stretching.  Putting ice or heat on the affected area.  Medicines that help: ? To relieve pain and swelling. ? To relax your muscles.  Shots (injections) of medicines that help to relieve pain, irritation, and swelling.  Surgery. Follow these instructions at home: Medicines  Take over-the-counter and prescription medicines only as told by your doctor.  Ask your doctor if the medicine prescribed to you: ? Requires you to avoid driving or using   heavy machinery. ? Can cause trouble pooping (constipation). You may need to take these steps to prevent or treat trouble pooping:  Drink enough fluids to keep your pee (urine) pale yellow.  Take over-the-counter or prescription medicines.  Eat foods that are high in fiber. These include beans, whole grains, and fresh fruits and vegetables.  Limit foods that are high in fat and sugar. These include fried or sweet foods. Managing pain      If told, put ice on the affected area. ? Put ice in a plastic bag. ? Place a towel between  your skin and the bag. ? Leave the ice on for 20 minutes, 2-3 times a day.  If told, put heat on the affected area. Use the heat source that your doctor tells you to use, such as a moist heat pack or a heating pad. ? Place a towel between your skin and the heat source. ? Leave the heat on for 20-30 minutes. ? Remove the heat if your skin turns bright red. This is very important if you are unable to feel pain, heat, or cold. You may have a greater risk of getting burned. Activity   Return to your normal activities as told by your doctor. Ask your doctor what activities are safe for you.  Avoid activities that make your symptoms worse.  Take short rests during the day. ? When you rest for a long time, do some physical activity or stretching between periods of rest. ? Avoid sitting for a long time without moving. Get up and move around at least one time each hour.  Exercise and stretch regularly, as told by your doctor.  Do not lift anything that is heavier than 10 lb (4.5 kg) while you have symptoms of sciatica. ? Avoid lifting heavy things even when you do not have symptoms. ? Avoid lifting heavy things over and over.  When you lift objects, always lift in a way that is safe for your body. To do this, you should: ? Bend your knees. ? Keep the object close to your body. ? Avoid twisting. General instructions  Stay at a healthy weight.  Wear comfortable shoes that support your feet. Avoid wearing high heels.  Avoid sleeping on a mattress that is too soft or too hard. You might have less pain if you sleep on a mattress that is firm enough to support your back.  Keep all follow-up visits as told by your doctor. This is important. Contact a doctor if:  You have pain that: ? Wakes you up when you are sleeping. ? Gets worse when you lie down. ? Is worse than the pain you have had in the past. ? Lasts longer than 4 weeks.  You lose weight without trying. Get help right away  if:  You cannot control when you pee (urinate) or poop (have a bowel movement).  You have weakness in any of these areas and it gets worse: ? Lower back. ? The area between your hip bones. ? Butt. ? Legs.  You have redness or swelling of your back.  You have a burning feeling when you pee. Summary  Sciatica is pain, weakness, tingling, or loss of feeling (numbness) along the sciatic nerve.  This condition happens when the sciatic nerve is pinched or has pressure put on it.  Sciatica can cause pain, tingling, or loss of feeling (numbness) in the lower back, legs, hips, and butt.  Treatment often includes rest, exercise, medicines, and putting ice or heat   on the affected area. This information is not intended to replace advice given to you by your health care provider. Make sure you discuss any questions you have with your health care provider. Document Revised: 10/03/2018 Document Reviewed: 10/03/2018 Elsevier Patient Education  2020 Elsevier Inc.  

## 2019-10-19 NOTE — Progress Notes (Signed)
Katherine [name] 64 y.o.   Chief Complaint  Patient presents with  . Back Pain    lower area radiates to RIGHT THIGH area x 2 months    HISTORY OF PRESENT ILLNESS: This is a 64 y.o. female complaining of right-sided lumbar sharp almost constant pain radiating to right buttock and back of right leg for the past 2 months.  Denies injury.  No associated symptoms.  HPI   Prior to Admission medications   Medication Sig Start Date End Date Taking? Authorizing Provider  amLODipine (NORVASC) 5 MG tablet Take 1 tablet (5 mg total) by mouth daily. 06/26/19  Yes SagardiaInes Bloomer, MD  aspirin EC 81 MG tablet Take 81 mg by mouth daily.   Yes [provider]  Biotin 1000 MCG tablet Take 1,000 mcg by mouth 3 (three) times daily.   Yes [provider]  Calcium Carb-Cholecalciferol (CALCIUM 600-D PO) Take by mouth.   Yes [provider]  Cholecalciferol (VITAMIN D3 PO) Take by mouth.   Yes [provider]  metFORMIN (GLUCOPHAGE) 500 MG tablet Take 1 tablet (500 mg total) by mouth 2 (two) times daily with a meal. 06/06/19  Yes Dareld Mcauliffe, [name], MD  Multiple Vitamin (MULTIVITAMIN) tablet Take 1 tablet by mouth daily.   Yes [provider]  Omega-3 Fatty Acids (OMEGA-3 2100 PO) Take by mouth daily.   Yes [provider]  rosuvastatin (CRESTOR) 10 MG tablet Take 1 tablet (10 mg total) by mouth daily. 11/28/18  Yes Kaeleb Emond, [name], MD  vitamin B-12 (CYANOCOBALAMIN) 1000 MCG tablet Take 1,000 mcg by mouth daily.   Yes [provider]  blood glucose meter kit and supplies KIT Dispense based on patient and insurance preference. Use up to four times daily as directed. (FOR ICD-9 250.00, 250.01). 06/20/18   [name], MD  diclofenac (VOLTAREN) 75 MG EC tablet Take 1 tablet (75 mg total) by mouth 2 (two) times daily. Patient not taking: Reported on 06/06/2019 03/14/18   Shawnee Knapp, MD  furosemide (LASIX) 20 MG tablet Take 1  tablet (20 mg total) by mouth daily. 03/07/18   Shawnee Knapp, MD  glucose blood (ONE TOUCH ULTRA TEST) test strip USE  STRIP TO CHECK GLUCOSE 4 TIMES DAILY AS DIRECTED 11/28/18   Elleanna Melling, [name], MD  Lancets [facility] DELICA PLUS [id number]) MISC Inject 1 Stick into the skin 3 (three) times daily as needed. 06/06/19 09/04/19  [name], MD  ranitidine (ZANTAC) 150 MG capsule Take 150 mg by mouth as needed for heartburn.    [provider]  traZODone (DESYREL) 50 MG tablet TAKE 1/2 TO 1 (ONE-HALF TO ONE) TABLET BY MOUTH AT BEDTIME AS NEEDED FOR SLEEP Patient not taking: Reported on 06/06/2019 11/15/18   Rutherford Guys, MD    Allergies  Allergen Reactions  . Ace Inhibitors Cough  . Egg Yolk Other (See Comments)    vomiting  . Adhesive [Tape] Rash    Patient Active Problem List   Diagnosis Date Noted  . Class 2 severe obesity due to excess calories with serious comorbidity and body mass index (BMI) of 35.0 to 35.9 in adult [facility]) 10/19/2019  . Osteoarthritis of spine with radiculopathy, cervical region 11/30/2017  . Other cervical disc degeneration, unspecified cervical region 07/17/2017  . Arthritis 06/23/2014  . Prediabetes 10/20/2013  . Obesity (BMI 35.0-39.9 without comorbidity) 10/20/2013  . Hypertension associated with diabetes [facility])     Past Medical History:  Diagnosis Date  .  Diabetes mellitus without complication ([place])   . Hypertension     Past Surgical History:  Procedure Laterality Date  . APPENDECTOMY    . CHOLECYSTECTOMY    . rotator cuff surgery Right 07/2014    Social History   Socioeconomic History  . Marital status: Married    Spouse name: Not on file  . Number of children: 2  . Years of education: Masters   . Highest education level: Not on file  Occupational History  . Occupation: Pharmacist, hospital  Tobacco Use  . Smoking status: Never Smoker  . Smokeless tobacco: Never Used  Substance and Sexual Activity  . Alcohol use: Yes     Alcohol/week: 0.0 standard drinks    Comment: social  . Drug use: No  . Sexual activity: Yes  Other Topics Concern  . Not on file  Social History Narrative   Patient has had 2 pregnancies and has 2 living children.   Social Determinants of Health   Financial Resource Strain:   . Difficulty of Paying Living Expenses: Not on file  Food Insecurity:   . Worried About Charity fundraiser in the Last Year: Not on file  . Ran Out of Food in the Last Year: Not on file  Transportation Needs:   . Lack of Transportation (Medical): Not on file  . Lack of Transportation (Non-Medical): Not on file  Physical Activity:   . Days of Exercise per Week: Not on file  . Minutes of Exercise per Session: Not on file  Stress:   . Feeling of Stress : Not on file  Social Connections:   . Frequency of Communication with Friends and Family: Not on file  . Frequency of Social Gatherings with Friends and Family: Not on file  . Attends Religious Services: Not on file  . Active Member of Clubs or Organizations: Not on file  . Attends Archivist Meetings: Not on file  . Marital Status: Not on file  Intimate Partner Violence:   . Fear of Current or Ex-Partner: Not on file  . Emotionally Abused: Not on file  . Physically Abused: Not on file  . Sexually Abused: Not on file    Family History  Problem Relation Age of Onset  . Diabetes Father   . Hypertension Father   . Sickle cell anemia Brother   . Cancer Brother 15       Leukemia, passed away at 85  . Cancer Maternal Grandmother        breast cancer  . Breast cancer Maternal Grandmother      Review of Systems  Constitutional: Negative.  Negative for chills and fever.  HENT: Negative.  Negative for congestion and sore throat.   Respiratory: Negative.  Negative for cough and shortness of breath.   Cardiovascular: Negative.  Negative for chest pain and palpitations.  Gastrointestinal: Negative.  Negative for abdominal pain, blood in stool,  melena, nausea and vomiting.  Genitourinary: Negative.  Negative for dysuria and hematuria.  Musculoskeletal: Positive for back pain.  Skin: Negative.  Negative for rash.  Neurological: Negative for dizziness and headaches.  All other systems reviewed and are negative.   Today's Vitals   10/19/19 1134  BP: 126/80  Pulse: 88  Resp: 16  Temp: 97.7 F (36.5 C)  TempSrc: Temporal  SpO2: 98%  Weight: 239 lb (108.4 kg)  Height: _0  (1.651 m)   Body mass index is 39.77 kg/m.  Physical Exam Vitals reviewed.  Constitutional:  Appearance: Normal appearance. She is obese.  HENT:     [name]: Normocephalic.  Eyes:     Extraocular Movements: Extraocular movements intact.     Pupils: Pupils are equal, round, and reactive to light.  Cardiovascular:     Rate and Rhythm: Normal rate.  Pulmonary:     Effort: Pulmonary effort is normal.  Abdominal:     Palpations: Abdomen is soft.     Tenderness: There is no abdominal tenderness.  Musculoskeletal:     Cervical back: Normal range of motion.     Lumbar back: Spasms and tenderness present. No bony tenderness. Decreased range of motion.  Skin:    General: Skin is warm and dry.     Capillary Refill: Capillary refill takes less than 2 seconds.  Neurological:     General: No focal deficit present.     Mental Status: She is alert and oriented to person, place, and time.     Sensory: No sensory deficit.     Motor: No weakness.     Gait: Gait normal.  Psychiatric:        Mood and Affect: Mood normal.        Behavior: Behavior normal.      ASSESSMENT & PLAN: Monnie was seen today for back pain.  Diagnoses and all orders for this visit:  Back pain of lumbar region with sciatica -     meloxicam (MOBIC) 15 MG tablet; Take 1 tablet (15 mg total) by mouth daily. -     cyclobenzaprine (FLEXERIL) 10 MG tablet; Take 1 tablet (10 mg total) by mouth at bedtime.  Hypertension associated with diabetes ([place])  Class 2 severe obesity due  to excess calories with serious comorbidity and body mass index (BMI) of 35.0 to 35.9 in adult [facility])  Arthritis    Patient Instructions       If you have lab work done today you will be contacted with your lab results within the next 2 weeks.  If you have not heard from [place] then please contact us. The fastest way to get your results is to register for My Chart.   IF you received an x-ray today, you will receive an invoice from [facility]. Please contact [facility] at [phone] with questions or concerns regarding your invoice.   IF you received labwork today, you will receive an invoice from [facility]. Please contact [facility] at [phone] with questions or concerns regarding your invoice.   Our billing staff will not be able to assist you with questions regarding bills from these companies.  You will be contacted with the lab results as soon as they are available. The fastest way to get your results is to activate your My Chart account. Instructions are located on the last page of this paperwork. If you have not heard from [place] regarding the results in 2 weeks, please contact this office.     Sciatica  Sciatica is pain, weakness, tingling, or loss of feeling (numbness) along the sciatic nerve. The sciatic nerve starts in the lower back and goes down the back of each leg. Sciatica usually goes away on its own or with treatment. Sometimes, sciatica may come back (recur). What are the causes? This condition happens when the sciatic nerve is pinched or has pressure put on it. This may be the result of:  A disk in between the bones of the spine bulging out too far (herniated disk).  Changes in the spinal disks that occur with aging.  A  condition that affects a muscle in the butt.  Extra bone growth near the sciatic nerve.  A break (fracture) of the area between your hip bones (pelvis).  Pregnancy.  Tumor. This is rare. What increases the risk? You are  more likely to develop this condition if you:  Play sports that put pressure or stress on the spine.  Have poor strength and ease of movement (flexibility).  Have had a back injury in the past.  Have had back surgery.  Sit for long periods of time.  Do activities that involve bending or lifting over and over again.  Are very overweight (obese). What are the signs or symptoms? Symptoms can vary from mild to very bad. They may include:  Any of these problems in the lower back, leg, hip, or butt: ? Mild tingling, loss of feeling, or dull aches. ? Burning sensations. ? Sharp pains.  Loss of feeling in the back of the calf or the sole of the foot.  Leg weakness.  Very bad back pain that makes it hard to move. These symptoms may get worse when you cough, sneeze, or laugh. They may also get worse when you sit or stand for long periods of time. How is this treated? This condition often gets better without any treatment. However, treatment may include:  Changing or cutting back on physical activity when you have pain.  Doing exercises and stretching.  Putting ice or heat on the affected area.  Medicines that help: ? To relieve pain and swelling. ? To relax your muscles.  Shots (injections) of medicines that help to relieve pain, irritation, and swelling.  Surgery. Follow these instructions at home: Medicines  Take over-the-counter and prescription medicines only as told by your doctor.  Ask your doctor if the medicine prescribed to you: ? Requires you to avoid driving or using heavy machinery. ? Can cause trouble pooping (constipation). You may need to take these steps to prevent or treat trouble pooping:  Drink enough fluids to keep your pee (urine) pale yellow.  Take over-the-counter or prescription medicines.  Eat foods that are high in fiber. These include beans, whole grains, and fresh fruits and vegetables.  Limit foods that are high in fat and sugar. These  include fried or sweet foods. Managing pain      If told, put ice on the affected area. ? Put ice in a plastic bag. ? Place a towel between your skin and the bag. ? Leave the ice on for 20 minutes, 2-3 times a day.  If told, put heat on the affected area. Use the heat source that your doctor tells you to use, such as a moist heat pack or a heating pad. ? Place a towel between your skin and the heat source. ? Leave the heat on for 20-30 minutes. ? Remove the heat if your skin turns bright red. This is very important if you are unable to feel pain, heat, or cold. You may have a greater risk of getting burned. Activity   Return to your normal activities as told by your doctor. Ask your doctor what activities are safe for you.  Avoid activities that make your symptoms worse.  Take short rests during the day. ? When you rest for a long time, do some physical activity or stretching between periods of rest. ? Avoid sitting for a long time without moving. Get up and move around at least one time each hour.  Exercise and stretch regularly, as told by your  doctor.  Do not lift anything that is heavier than 10 lb (4.5 kg) while you have symptoms of sciatica. ? Avoid lifting heavy things even when you do not have symptoms. ? Avoid lifting heavy things over and over.  When you lift objects, always lift in a way that is safe for your body. To do this, you should: ? Bend your knees. ? Keep the object close to your body. ? Avoid twisting. General instructions  Stay at a healthy weight.  Wear comfortable shoes that support your feet. Avoid wearing high heels.  Avoid sleeping on a mattress that is too soft or too hard. You might have less pain if you sleep on a mattress that is firm enough to support your back.  Keep all follow-up visits as told by your doctor. This is important. Contact a doctor if:  You have pain that: ? Wakes you up when you are sleeping. ? Gets worse when you lie  down. ? Is worse than the pain you have had in the past. ? Lasts longer than 4 weeks.  You lose weight without trying. Get help right away if:  You cannot control when you pee (urinate) or poop (have a bowel movement).  You have weakness in any of these areas and it gets worse: ? Lower back. ? The area between your hip bones. ? Butt. ? Legs.  You have redness or swelling of your back.  You have a burning feeling when you pee. Summary  Sciatica is pain, weakness, tingling, or loss of feeling (numbness) along the sciatic nerve.  This condition happens when the sciatic nerve is pinched or has pressure put on it.  Sciatica can cause pain, tingling, or loss of feeling (numbness) in the lower back, legs, hips, and butt.  Treatment often includes rest, exercise, medicines, and putting ice or heat on the affected area. This information is not intended to replace advice given to you by your health care provider. Make sure you discuss any questions you have with your health care provider. Document Revised: 10/03/2018 Document Reviewed: 10/03/2018 Elsevier Patient Education  2020 Elsevier Inc.      Katherine Caroli, MD [facility]

## 2019-12-04 ENCOUNTER — Encounter: Payer: Self-pay | Admitting: Emergency Medicine

## 2019-12-04 ENCOUNTER — Other Ambulatory Visit: Payer: Self-pay

## 2019-12-04 ENCOUNTER — Ambulatory Visit: Payer: Medicare PPO | Admitting: Emergency Medicine

## 2019-12-04 VITALS — BP 126/76 | HR 78 | Temp 97.6°F | Resp 16 | Ht 65.0 in | Wt 243.0 lb

## 2019-12-04 DIAGNOSIS — Z6841 Body Mass Index (BMI) 40.0 and over, adult: Secondary | ICD-10-CM

## 2019-12-04 DIAGNOSIS — E1159 Type 2 diabetes mellitus with other circulatory complications: Secondary | ICD-10-CM

## 2019-12-04 DIAGNOSIS — I10 Essential (primary) hypertension: Secondary | ICD-10-CM | POA: Diagnosis not present

## 2019-12-04 DIAGNOSIS — M1990 Unspecified osteoarthritis, unspecified site: Secondary | ICD-10-CM

## 2019-12-04 LAB — POCT GLYCOSYLATED HEMOGLOBIN (HGB A1C): Hemoglobin A1C: 6.5 % — AB (ref 4.0–5.6)

## 2019-12-04 LAB — GLUCOSE, POCT (MANUAL RESULT ENTRY): POC Glucose: 117 mg/dl — AB (ref 70–99)

## 2019-12-04 MED ORDER — ROSUVASTATIN CALCIUM 10 MG PO TABS: 10.0000 mg | ORAL_TABLET | Freq: Every day | ORAL | 3 refills | Status: DC

## 2019-12-04 NOTE — Assessment & Plan Note (Signed)
Well-controlled hypertension.  Continue present medication.  Amlodipine 5 mg daily. Hemoglobin A1c today at 6.5, worse than before which was 5.5.  Continue Metformin 500 mg twice a day.  Diet and nutrition as well as physical exercise discussed with patient.  Referred to nutritional services. [name] in 6 months.

## 2019-12-04 NOTE — Patient Instructions (Addendum)
   If you have lab work done today you will be contacted with your lab results within the next 2 weeks.  If you have not heard from us then please contact us. The fastest way to get your results is to register for My Chart.   IF you received an x-ray today, you will receive an invoice from [facility]. Please contact [facility] at [phone] with questions or concerns regarding your invoice.   IF you received labwork today, you will receive an invoice from [facility]. Please contact [facility] at [phone] with questions or concerns regarding your invoice.   Our billing staff will not be able to assist you with questions regarding bills from these companies.  You will be contacted with the lab results as soon as they are available. The fastest way to get your results is to activate your My Chart account. Instructions are located on the last page of this paperwork. If you have not heard from us regarding the results in 2 weeks, please contact this office.      Calorie Counting for Weight Loss Calories are units of energy. Your body needs a certain amount of calories from food to keep you going throughout the day. When you eat more calories than your body needs, your body stores the extra calories as fat. When you eat fewer calories than your body needs, your body burns fat to get the energy it needs. Calorie counting means keeping track of how many calories you eat and drink each day. Calorie counting can be helpful if you need to lose weight. If you make sure to eat fewer calories than your body needs, you should lose weight. Ask your health care provider what a healthy weight is for you. For calorie counting to work, you will need to eat the right number of calories in a day in order to lose a healthy amount of weight per week. A dietitian can help you determine how many calories you need in a day and will give you suggestions on how to reach your calorie goal.  A healthy  amount of weight to lose per week is usually 1-2 lb (0.5-0.9 kg). This usually means that your daily calorie intake should be reduced by 500-750 calories.  Eating 1,200 - 1,500 calories per day can help most women lose weight.  Eating 1,500 - 1,800 calories per day can help most men lose weight. What is my plan? My goal is to have __________ calories per day. If I have this many calories per day, I should lose around __________ pounds per week. What do I need to know about calorie counting? In order to meet your daily calorie goal, you will need to:  Find out how many calories are in each food you would like to eat. Try to do this before you eat.  Decide how much of the food you plan to eat.  Write down what you ate and how many calories it had. Doing this is called keeping a food log. To successfully lose weight, it is important to balance calorie counting with a healthy lifestyle that includes regular activity. Aim for 150 minutes of moderate exercise (such as walking) or 75 minutes of vigorous exercise (such as running) each week. Where do I find calorie information?  The number of calories in a food can be found on a Nutrition Facts label. If a food does not have a Nutrition Facts label, try to look up the calories online or ask your dietitian   for help. Remember that calories are listed per serving. If you choose to have more than one serving of a food, you will have to multiply the calories per serving by the amount of servings you plan to eat. For example, the label on a package of bread might say that a serving size is 1 slice and that there are 90 calories in a serving. If you eat 1 slice, you will have eaten 90 calories. If you eat 2 slices, you will have eaten 180 calories. How do I keep a food log? Immediately after each meal, record the following information in your food log:  What you ate. Don't forget to include toppings, sauces, and other extras on the food.  How much you  ate. This can be measured in cups, ounces, or number of items.  How many calories each food and drink had.  The total number of calories in the meal. Keep your food log near you, such as in a small notebook in your pocket, or use a mobile app or website. Some programs will calculate calories for you and show you how many calories you have left for the day to meet your goal. What are some calorie counting tips?   Use your calories on foods and drinks that will fill you up and not leave you hungry: ? Some examples of foods that fill you up are nuts and nut butters, vegetables, lean proteins, and high-fiber foods like whole grains. High-fiber foods are foods with more than 5 g fiber per serving. ? Drinks such as sodas, specialty coffee drinks, alcohol, and juices have a lot of calories, yet do not fill you up.  Eat nutritious foods and avoid empty calories. Empty calories are calories you get from foods or beverages that do not have many vitamins or protein, such as candy, sweets, and soda. It is better to have a nutritious high-calorie food (such as an avocado) than a food with few nutrients (such as a bag of chips).  Know how many calories are in the foods you eat most often. This will help you calculate calorie counts faster.  Pay attention to calories in drinks. Low-calorie drinks include water and unsweetened drinks.  Pay attention to nutrition labels for "low fat" or "fat free" foods. These foods sometimes have the same amount of calories or more calories than the full fat versions. They also often have added sugar, starch, or salt, to make up for flavor that was removed with the fat.  Find a way of tracking calories that works for you. Get creative. Try different apps or programs if writing down calories does not work for you. What are some portion control tips?  Know how many calories are in a serving. This will help you know how many servings of a certain food you can have.  Use a  measuring cup to measure serving sizes. You could also try weighing out portions on a kitchen scale. With time, you will be able to estimate serving sizes for some foods.  Take some time to put servings of different foods on your favorite plates, bowls, and cups so you know what a serving looks like.  Try not to eat straight from a bag or box. Doing this can lead to overeating. Put the amount you would like to eat in a cup or on a plate to make sure you are eating the right portion.  Use smaller plates, glasses, and bowls to prevent overeating.  Try not to multitask (for   example, watch TV or use your computer) while eating. If it is time to eat, sit down at a table and enjoy your food. This will help you to know when you are full. It will also help you to be aware of what you are eating and how much you are eating. What are tips for following this plan? Reading food labels  Check the calorie count compared to the serving size. The serving size may be smaller than what you are used to eating.  Check the source of the calories. Make sure the food you are eating is high in vitamins and protein and low in saturated and trans fats. Shopping  Read nutrition labels while you shop. This will help you make healthy decisions before you decide to purchase your food.  Make a grocery list and stick to it. Cooking  Try to cook your favorite foods in a healthier way. For example, try baking instead of frying.  Use low-fat dairy products. Meal planning  Use more fruits and vegetables. Half of your plate should be fruits and vegetables.  Include lean proteins like poultry and fish. How do I count calories when eating out?  Ask for smaller portion sizes.  Consider sharing an entree and sides instead of getting your own entree.  If you get your own entree, eat only half. Ask for a box at the beginning of your meal and put the rest of your entree in it so you are not tempted to eat it.  If calories  are listed on the menu, choose the lower calorie options.  Choose dishes that include vegetables, fruits, whole grains, low-fat dairy products, and lean protein.  Choose items that are boiled, broiled, grilled, or steamed. Stay away from items that are buttered, battered, fried, or served with cream sauce. Items labeled "crispy" are usually fried, unless stated otherwise.  Choose water, low-fat milk, unsweetened iced tea, or other drinks without added sugar. If you want an alcoholic beverage, choose a lower calorie option such as a glass of wine or light beer.  Ask for dressings, sauces, and syrups on the side. These are usually high in calories, so you should limit the amount you eat.  If you want a salad, choose a garden salad and ask for grilled meats. Avoid extra toppings like bacon, cheese, or fried items. Ask for the dressing on the side, or ask for olive oil and vinegar or lemon to use as dressing.  Estimate how many servings of a food you are given. For example, a serving of cooked rice is  cup or about the size of half a baseball. Knowing serving sizes will help you be aware of how much food you are eating at restaurants. The list below tells you how big or small some common portion sizes are based on everyday objects: ? 1 oz--4 stacked dice. ? 3 oz--1 deck of cards. ? 1 tsp--1 die. ? 1 Tbsp-- a ping-pong ball. ? 2 Tbsp--1 ping-pong ball. ?  cup-- baseball. ? 1 cup--1 baseball. Summary  Calorie counting means keeping track of how many calories you eat and drink each day. If you eat fewer calories than your body needs, you should lose weight.  A healthy amount of weight to lose per week is usually 1-2 lb (0.5-0.9 kg). This usually means reducing your daily calorie intake by 500-750 calories.  The number of calories in a food can be found on a Nutrition Facts label. If a food does not have a Nutrition   Facts label, try to look up the calories online or ask your dietitian for  help.  Use your calories on foods and drinks that will fill you up, and not on foods and drinks that will leave you hungry.  Use smaller plates, glasses, and bowls to prevent overeating. This information is not intended to replace advice given to you by your health care provider. Make sure you discuss any questions you have with your health care provider. Document Revised: 06/03/2018 Document Reviewed: 08/14/2016 Elsevier Patient Education  2020 [employer]  Diabetes Mellitus and Nutrition, Adult When you have diabetes (diabetes mellitus), it is very important to have healthy eating habits because your blood sugar (glucose) levels are greatly affected by what you eat and drink. Eating healthy foods in the appropriate amounts, at about the same times every day, can help you:  Control your blood glucose.  Lower your risk of heart disease.  Improve your blood pressure.  Reach or maintain a healthy weight. Every person with diabetes is different, and each person has different needs for a meal plan. Your health care provider may recommend that you work with a diet and nutrition specialist (dietitian) to make a meal plan that is best for you. Your meal plan may vary depending on factors such as:  The calories you need.  The medicines you take.  Your weight.  Your blood glucose, blood pressure, and cholesterol levels.  Your activity level.  Other health conditions you have, such as heart or kidney disease. How do carbohydrates affect me? Carbohydrates, also called carbs, affect your blood glucose level more than any other type of food. Eating carbs naturally raises the amount of glucose in your blood. Carb counting is a method for keeping track of how many carbs you eat. Counting carbs is important to keep your blood glucose at a healthy level, especially if you use insulin or take certain oral diabetes medicines. It is important to know how many carbs you can safely have in each meal.  This is different for every person. Your dietitian can help you calculate how many carbs you should have at each meal and for each snack. Foods that contain carbs include:  Bread, cereal, rice, pasta, and crackers.  Potatoes and corn.  Peas, beans, and lentils.  Milk and yogurt.  Fruit and juice.  Desserts, such as cakes, cookies, ice cream, and candy. How does alcohol affect me? Alcohol can cause a sudden decrease in blood glucose (hypoglycemia), especially if you use insulin or take certain oral diabetes medicines. Hypoglycemia can be a life-threatening condition. Symptoms of hypoglycemia (sleepiness, dizziness, and confusion) are similar to symptoms of having too much alcohol. If your health care provider says that alcohol is safe for you, follow these guidelines:  Limit alcohol intake to no more than 1 drink per day for nonpregnant women and 2 drinks per day for men. One drink equals 12 oz of beer, 5 oz of wine, or 1 oz of hard liquor.  Do not drink on an empty stomach.  Keep yourself hydrated with water, diet soda, or unsweetened iced tea.  Keep in mind that regular soda, juice, and other mixers may contain a lot of sugar and must be counted as carbs. What are tips for following this plan?  Reading food labels  Start by checking the serving size on the "Nutrition Facts" label of packaged foods and drinks. The amount of calories, carbs, fats, and other nutrients listed on the label is based on one serving   of the item. Many items contain more than one serving per package.  Check the total grams (g) of carbs in one serving. You can calculate the number of servings of carbs in one serving by dividing the total carbs by 15. For example, if a food has 30 g of total carbs, it would be equal to 2 servings of carbs.  Check the number of grams (g) of saturated and trans fats in one serving. Choose foods that have low or no amount of these fats.  Check the number of milligrams (mg) of  salt (sodium) in one serving. Most people should limit total sodium intake to less than 2,300 mg per day.  Always check the nutrition information of foods labeled as "low-fat" or "nonfat". These foods may be higher in added sugar or refined carbs and should be avoided.  Talk to your dietitian to identify your daily goals for nutrients listed on the label. Shopping  Avoid buying canned, premade, or processed foods. These foods tend to be high in fat, sodium, and added sugar.  Shop around the outside edge of the grocery store. This includes fresh fruits and vegetables, bulk grains, fresh meats, and fresh dairy. Cooking  Use low-heat cooking methods, such as baking, instead of high-heat cooking methods like deep frying.  Cook using healthy oils, such as olive, canola, or sunflower oil.  Avoid cooking with butter, cream, or high-fat meats. Meal planning  Eat meals and snacks regularly, preferably at the same times every day. Avoid going long periods of time without eating.  Eat foods high in fiber, such as fresh fruits, vegetables, beans, and whole grains. Talk to your dietitian about how many servings of carbs you can eat at each meal.  Eat 4-6 ounces (oz) of lean protein each day, such as lean meat, chicken, fish, eggs, or tofu. One oz of lean protein is equal to: ? 1 oz of meat, chicken, or fish. ? 1 egg. ?  cup of tofu.  Eat some foods each day that contain healthy fats, such as avocado, nuts, seeds, and fish. Lifestyle  Check your blood glucose regularly.  Exercise regularly as told by your health care provider. This may include: ? 150 minutes of moderate-intensity or vigorous-intensity exercise each week. This could be brisk walking, biking, or water aerobics. ? Stretching and doing strength exercises, such as yoga or weightlifting, at least 2 times a week.  Take medicines as told by your health care provider.  Do not use any products that contain nicotine or tobacco, such  as cigarettes and e-cigarettes. If you need help quitting, ask your health care provider.  Work with a counselor or diabetes educator to identify strategies to manage stress and any emotional and social challenges. Questions to ask a health care provider  Do I need to meet with a diabetes educator?  Do I need to meet with a dietitian?  What number can I call if I have questions?  When are the best times to check my blood glucose? Where to find more information:  American Diabetes Association: diabetes.org  Academy of Nutrition and Dietetics: www.eatright.org  National Institute of Diabetes and Digestive and Kidney Diseases (NIH): www.niddk.nih.gov Summary  A healthy meal plan will help you control your blood glucose and maintain a healthy lifestyle.  Working with a diet and nutrition specialist (dietitian) can help you make a meal plan that is best for you.  Keep in mind that carbohydrates (carbs) and alcohol have immediate effects on your blood glucose   levels. It is important to count carbs and to use alcohol carefully. This information is not intended to replace advice given to you by your health care provider. Make sure you discuss any questions you have with your health care provider. Document Revised: 08/27/2017 Document Reviewed: 10/19/2016 Elsevier Patient Education  2020 [employer]  

## 2019-12-04 NOTE — Progress Notes (Signed)
Katherine [name] 64 y.o.   Chief Complaint  Patient presents with  . Diabetes    6 month follow up  . Medication Refill    Crestor    HISTORY OF PRESENT ILLNESS: This is a 64 y.o. female with history of diabetes and hypertension here for follow-up and medication refill. 1.  Hypertension: On amlodipine 5 mg daily 2.  Diabetes: On Metformin 500 mg twice a day.  Also taking Crestor 10 mg daily.  Needs medication refill. Concerned about weight gain. Wt Readings from Last 3 Encounters:  12/04/19 243 lb (110.2 kg)  10/19/19 239 lb (108.4 kg)  06/26/19 232 lb (105.2 kg)     HPI   Prior to Admission medications   Medication Sig Start Date End Date Taking? Authorizing Provider  amLODipine (NORVASC) 5 MG tablet Take 1 tablet (5 mg total) by mouth daily. 06/26/19  Yes SagardiaInes Bloomer, MD  aspirin EC 81 MG tablet Take 81 mg by mouth daily.   Yes [provider]  Biotin 1000 MCG tablet Take 1,000 mcg by mouth 3 (three) times daily.   Yes [provider]  blood glucose meter kit and supplies KIT Dispense based on patient and insurance preference. Use up to four times daily as directed. (FOR ICD-9 250.00, 250.01). 06/20/18  Yes [name], [name], MD  Calcium Carb-Cholecalciferol (CALCIUM 600-D PO) Take by mouth.   Yes [provider]  Cholecalciferol (VITAMIN D3 PO) Take by mouth.   Yes [provider]  glucose blood (ONE TOUCH ULTRA TEST) test strip USE  STRIP TO CHECK GLUCOSE 4 TIMES DAILY AS DIRECTED 11/28/18  Yes [name], [name], MD  metFORMIN (GLUCOPHAGE) 500 MG tablet Take 1 tablet (500 mg total) by mouth 2 (two) times daily with a meal. 06/06/19  Yes [name], [name], MD  Multiple Vitamin (MULTIVITAMIN) tablet Take 1 tablet by mouth daily.   Yes [provider]  Omega-3 Fatty Acids (OMEGA-3 2100 PO) Take by mouth daily.   Yes [provider]  rosuvastatin (CRESTOR) 10 MG tablet Take 1 tablet (10 mg total) by mouth  daily. 12/04/19  Yes [name], [name], MD  vitamin B-12 (CYANOCOBALAMIN) 1000 MCG tablet Take 1,000 mcg by mouth daily.   Yes [provider]  cyclobenzaprine (FLEXERIL) 10 MG tablet Take 1 tablet (10 mg total) by mouth at bedtime. Patient not taking: Reported on 12/04/2019 10/19/19   [name], MD  Lancets [facility] DELICA PLUS [id number]) MISC Inject 1 Stick into the skin 3 (three) times daily as needed. 06/06/19 09/04/19  [name], MD  meloxicam (MOBIC) 15 MG tablet Take 1 tablet (15 mg total) by mouth daily. 10/19/19   [name], MD    Allergies  Allergen Reactions  . Ace Inhibitors Cough  . Egg Yolk Other (See Comments)    vomiting  . Adhesive [Tape] Rash    Patient Active Problem List   Diagnosis Date Noted  . Class 2 severe obesity due to excess calories with serious comorbidity and body mass index (BMI) of 35.0 to 35.9 in adult [facility]) 10/19/2019  . Osteoarthritis of spine with radiculopathy, cervical region 11/30/2017  . Other cervical disc degeneration, unspecified cervical region 07/17/2017  . Arthritis 06/23/2014  . Prediabetes 10/20/2013  . Obesity (BMI 35.0-39.9 without comorbidity) 10/20/2013  . Hypertension associated with diabetes [facility])     Past Medical History:  Diagnosis Date  . Diabetes mellitus without complication ([place])   . Hypertension  Past Surgical History:  Procedure Laterality Date  . APPENDECTOMY    . CHOLECYSTECTOMY    . rotator cuff surgery Right 07/2014    Social History   Socioeconomic History  . Marital status: Married    Spouse name: Not on file  . Number of children: 2  . Years of education: Masters   . Highest education level: Not on file  Occupational History  . Occupation: Pharmacist, hospital  Tobacco Use  . Smoking status: Never Smoker  . Smokeless tobacco: Never Used  Substance and Sexual Activity  . Alcohol use: Yes    Alcohol/week: 0.0 standard drinks    Comment: social  . Drug use: No  .  Sexual activity: Yes  Other Topics Concern  . Not on file  Social History Narrative   Patient has had 2 pregnancies and has 2 living children.   Social Determinants of Health   Financial Resource Strain:   . Difficulty of Paying Living Expenses: Not on file  Food Insecurity:   . Worried About Charity fundraiser in the Last Year: Not on file  . Ran Out of Food in the Last Year: Not on file  Transportation Needs:   . Lack of Transportation (Medical): Not on file  . Lack of Transportation (Non-Medical): Not on file  Physical Activity:   . Days of Exercise per Week: Not on file  . Minutes of Exercise per Session: Not on file  Stress:   . Feeling of Stress : Not on file  Social Connections:   . Frequency of Communication with Friends and Family: Not on file  . Frequency of Social Gatherings with Friends and Family: Not on file  . Attends Religious Services: Not on file  . Active Member of Clubs or Organizations: Not on file  . Attends Archivist Meetings: Not on file  . Marital Status: Not on file  Intimate Partner Violence:   . Fear of Current or Ex-Partner: Not on file  . Emotionally Abused: Not on file  . Physically Abused: Not on file  . Sexually Abused: Not on file    Family History  Problem Relation Age of Onset  . Diabetes Father   . Hypertension Father   . Sickle cell anemia Brother   . Cancer Brother 15       Leukemia, passed away at 78  . Cancer Maternal Grandmother        breast cancer  . Breast cancer Maternal Grandmother      Review of Systems  Constitutional: Negative.  Negative for chills and fever.  HENT: Negative.  Negative for congestion and sore throat.   Respiratory: Negative.  Negative for cough and shortness of breath.   Cardiovascular: Negative.  Negative for chest pain and palpitations.  Gastrointestinal: Negative.  Negative for abdominal pain, nausea and vomiting.  Genitourinary: Negative.  Negative for dysuria and hematuria.   Musculoskeletal: Negative.  Negative for myalgias and neck pain.  Skin: Negative.   Neurological: Negative.  Negative for dizziness and headaches.  All other systems reviewed and are negative.  Today's Vitals   12/04/19 1115  BP: 126/76  Pulse: 78  Resp: 16  Temp: 97.6 F (36.4 C)  TempSrc: Temporal  SpO2: 97%  Weight: 243 lb (110.2 kg)  Height: 5' 5"  (1.651 m)   Body mass index is 40.44 kg/m.   Physical Exam Vitals reviewed.  Constitutional:      Appearance: Normal appearance.  HENT:     [name]: Normocephalic.  Eyes:  Extraocular Movements: Extraocular movements intact.     Pupils: Pupils are equal, round, and reactive to light.  Cardiovascular:     Rate and Rhythm: Normal rate and regular rhythm.     Pulses: Normal pulses.     Heart sounds: Normal heart sounds.  Pulmonary:     Effort: Pulmonary effort is normal.     Breath sounds: Normal breath sounds.  Musculoskeletal:        General: Normal range of motion.     Cervical back: Normal range of motion and neck supple.  Skin:    General: Skin is warm and dry.     Capillary Refill: Capillary refill takes less than 2 seconds.  Neurological:     General: No focal deficit present.     Mental Status: She is alert and oriented to person, place, and time.  Psychiatric:        Mood and Affect: Mood normal.        Behavior: Behavior normal.     Results for orders placed or performed in visit on 12/04/19 (from the past 24 hour(s))  POCT glucose (manual entry)     Status: Abnormal   Collection Time: 12/04/19 12:14 PM  Result Value Ref Range   POC Glucose 117 (A) 70 - 99 mg/dl  POCT glycosylated hemoglobin (Hb A1C)     Status: Abnormal   Collection Time: 12/04/19 12:20 PM  Result Value Ref Range   Hemoglobin A1C 6.5 (A) 4.0 - 5.6 %   HbA1c POC (<> result, manual entry)     HbA1c, POC (prediabetic range)     HbA1c, POC (controlled diabetic range)     A total of 30 minutes was spent with the patient, greater than  50% of which was in counseling/coordination of care regarding diabetes and hypertension, cardiovascular risks associated with these conditions, review of most recent office visit notes, review of most recent blood work results including today's hemoglobin A1c, importance of diet nutrition and physical exercise, prognosis and need for follow-up in 6 months.  ASSESSMENT & PLAN: Katherine [name] was seen today for diabetes and medication refill.  Diagnoses and all orders for this visit:  Hypertension associated with diabetes ([place]) -     CBC with Differential/Platelet -     Comprehensive metabolic panel -     Lipid panel -     POCT glucose (manual entry) -     POCT glycosylated hemoglobin (Hb A1C) -     rosuvastatin (CRESTOR) 10 MG tablet; Take 1 tablet (10 mg total) by mouth daily.  Arthritis  Essential hypertension  Body mass index (BMI) of 40.1-44.9 in adult [facility]) -     Amb ref to Medical Nutrition Therapy-MNT    Patient Instructions       If you have lab work done today you will be contacted with your lab results within the next 2 weeks.  If you have not heard from [place] then please contact us. The fastest way to get your results is to register for My Chart.   IF you received an x-ray today, you will receive an invoice from [facility]. Please contact [facility] at [phone] with questions or concerns regarding your invoice.   IF you received labwork today, you will receive an invoice from [facility]. Please contact [facility] at [phone] with questions or concerns regarding your invoice.   Our billing staff will not be able to assist you with questions regarding bills from these companies.  You will be contacted with the  lab results as soon as they are available. The fastest way to get your results is to activate your My Chart account. Instructions are located on the last page of this paperwork. If you have not heard from [place] regarding the results in 2 weeks, please  contact this office.      Calorie Counting for Weight Loss Calories are units of energy. Your body needs a certain amount of calories from food to keep you going throughout the day. When you eat more calories than your body needs, your body stores the extra calories as fat. When you eat fewer calories than your body needs, your body burns fat to get the energy it needs. Calorie counting means keeping track of how many calories you eat and drink each day. Calorie counting can be helpful if you need to lose weight. If you make sure to eat fewer calories than your body needs, you should lose weight. Ask your health care provider what a healthy weight is for you. For calorie counting to work, you will need to eat the right number of calories in a day in order to lose a healthy amount of weight per week. A dietitian can help you determine how many calories you need in a day and will give you suggestions on how to reach your calorie goal.  A healthy amount of weight to lose per week is usually 1-2 lb (0.5-0.9 kg). This usually means that your daily calorie intake should be reduced by 500-750 calories.  Eating 1,200 - 1,500 calories per day can help most women lose weight.  Eating 1,500 - 1,800 calories per day can help most men lose weight. What is my plan? My goal is to have __________ calories per day. If I have this many calories per day, I should lose around __________ pounds per week. What do I need to know about calorie counting? In order to meet your daily calorie goal, you will need to:  Find out how many calories are in each food you would like to eat. Try to do this before you eat.  Decide how much of the food you plan to eat.  Write down what you ate and how many calories it had. Doing this is called keeping a food log. To successfully lose weight, it is important to balance calorie counting with a healthy lifestyle that includes regular activity. Aim for 150 minutes of moderate  exercise (such as walking) or 75 minutes of vigorous exercise (such as running) each week. Where do I find calorie information?  The number of calories in a food can be found on a Nutrition Facts label. If a food does not have a Nutrition Facts label, try to look up the calories online or ask your dietitian for help. Remember that calories are listed per serving. If you choose to have more than one serving of a food, you will have to multiply the calories per serving by the amount of servings you plan to eat. For example, the label on a package of bread might say that a serving size is 1 slice and that there are 90 calories in a serving. If you eat 1 slice, you will have eaten 90 calories. If you eat 2 slices, you will have eaten 180 calories. How do I keep a food log? Immediately after each meal, record the following information in your food log:  What you ate. Don't forget to include toppings, sauces, and other extras on the food.  How much you ate.  This can be measured in cups, ounces, or number of items.  How many calories each food and drink had.  The total number of calories in the meal. Keep your food log near you, such as in a small notebook in your pocket, or use a mobile app or website. Some programs will calculate calories for you and show you how many calories you have left for the day to meet your goal. What are some calorie counting tips?   Use your calories on foods and drinks that will fill you up and not leave you hungry: ? Some examples of foods that fill you up are nuts and nut butters, vegetables, lean proteins, and high-fiber foods like whole grains. High-fiber foods are foods with more than 5 g fiber per serving. ? Drinks such as sodas, specialty coffee drinks, alcohol, and juices have a lot of calories, yet do not fill you up.  Eat nutritious foods and avoid empty calories. Empty calories are calories you get from foods or beverages that do not have many vitamins or  protein, such as candy, sweets, and soda. It is better to have a nutritious high-calorie food (such as an avocado) than a food with few nutrients (such as a bag of chips).  Know how many calories are in the foods you eat most often. This will help you calculate calorie counts faster.  Pay attention to calories in drinks. Low-calorie drinks include water and unsweetened drinks.  Pay attention to nutrition labels for "low fat" or "fat free" foods. These foods sometimes have the same amount of calories or more calories than the full fat versions. They also often have added sugar, starch, or salt, to make up for flavor that was removed with the fat.  Find a way of tracking calories that works for you. Get creative. Try different apps or programs if writing down calories does not work for you. What are some portion control tips?  Know how many calories are in a serving. This will help you know how many servings of a certain food you can have.  Use a measuring cup to measure serving sizes. You could also try weighing out portions on a kitchen scale. With time, you will be able to estimate serving sizes for some foods.  Take some time to put servings of different foods on your favorite plates, bowls, and cups so you know what a serving looks like.  Try not to eat straight from a bag or box. Doing this can lead to overeating. Put the amount you would like to eat in a cup or on a plate to make sure you are eating the right portion.  Use smaller plates, glasses, and bowls to prevent overeating.  Try not to multitask (for example, watch TV or use your computer) while eating. If it is time to eat, sit down at a table and enjoy your food. This will help you to know when you are full. It will also help you to be aware of what you are eating and how much you are eating. What are tips for following this plan? Reading food labels  Check the calorie count compared to the serving size. The serving size may be  smaller than what you are used to eating.  Check the source of the calories. Make sure the food you are eating is high in vitamins and protein and low in saturated and trans fats. Shopping  Read nutrition labels while you shop. This will help you make healthy decisions  before you decide to purchase your food.  Make a grocery list and stick to it. Cooking  Try to cook your favorite foods in a healthier way. For example, try baking instead of frying.  Use low-fat dairy products. Meal planning  Use more fruits and vegetables. Half of your plate should be fruits and vegetables.  Include lean proteins like poultry and fish. How do I count calories when eating out?  Ask for smaller portion sizes.  Consider sharing an entree and sides instead of getting your own entree.  If you get your own entree, eat only half. Ask for a box at the beginning of your meal and put the rest of your entree in it so you are not tempted to eat it.  If calories are listed on the menu, choose the lower calorie options.  Choose dishes that include vegetables, fruits, whole grains, low-fat dairy products, and lean protein.  Choose items that are boiled, broiled, grilled, or steamed. Stay away from items that are buttered, battered, fried, or served with cream sauce. Items labeled "crispy" are usually fried, unless stated otherwise.  Choose water, low-fat milk, unsweetened iced tea, or other drinks without added sugar. If you want an alcoholic beverage, choose a lower calorie option such as a glass of wine or light beer.  Ask for dressings, sauces, and syrups on the side. These are usually high in calories, so you should limit the amount you eat.  If you want a salad, choose a garden salad and ask for grilled meats. Avoid extra toppings like bacon, cheese, or fried items. Ask for the dressing on the side, or ask for olive oil and vinegar or lemon to use as dressing.  Estimate how many servings of a food you are  given. For example, a serving of cooked rice is  cup or about the size of half a baseball. Knowing serving sizes will help you be aware of how much food you are eating at restaurants. The list below tells you how big or small some common portion sizes are based on everyday objects: ? 1 oz--4 stacked dice. ? 3 oz--1 deck of cards. ? 1 tsp--1 die. ? 1 Tbsp-- a ping-pong ball. ? 2 Tbsp--1 ping-pong ball. ?  cup-- baseball. ? 1 cup--1 baseball. Summary  Calorie counting means keeping track of how many calories you eat and drink each day. If you eat fewer calories than your body needs, you should lose weight.  A healthy amount of weight to lose per week is usually 1-2 lb (0.5-0.9 kg). This usually means reducing your daily calorie intake by 500-750 calories.  The number of calories in a food can be found on a Nutrition Facts label. If a food does not have a Nutrition Facts label, try to look up the calories online or ask your dietitian for help.  Use your calories on foods and drinks that will fill you up, and not on foods and drinks that will leave you hungry.  Use smaller plates, glasses, and bowls to prevent overeating. This information is not intended to replace advice given to you by your health care provider. Make sure you discuss any questions you have with your health care provider. Document Revised: 06/03/2018 Document Reviewed: 08/14/2016 Elsevier Patient Education  [place].  Diabetes Mellitus and Nutrition, Adult When you have diabetes (diabetes mellitus), it is very important to have healthy eating habits because your blood sugar (glucose) levels are greatly affected by what you eat and drink. Eating  healthy foods in the appropriate amounts, at about the same times every day, can help you:  Control your blood glucose.  Lower your risk of heart disease.  Improve your blood pressure.  Reach or maintain a healthy weight. Every person with diabetes is different, and  each person has different needs for a meal plan. Your health care provider may recommend that you work with a diet and nutrition specialist (dietitian) to make a meal plan that is best for you. Your meal plan may vary depending on factors such as:  The calories you need.  The medicines you take.  Your weight.  Your blood glucose, blood pressure, and cholesterol levels.  Your activity level.  Other health conditions you have, such as heart or kidney disease. How do carbohydrates affect me? Carbohydrates, also called carbs, affect your blood glucose level more than any other type of food. Eating carbs naturally raises the amount of glucose in your blood. Carb counting is a method for keeping track of how many carbs you eat. Counting carbs is important to keep your blood glucose at a healthy level, especially if you use insulin or take certain oral diabetes medicines. It is important to know how many carbs you can safely have in each meal. This is different for every person. Your dietitian can help you calculate how many carbs you should have at each meal and for each snack. Foods that contain carbs include:  Bread, cereal, rice, pasta, and crackers.  Potatoes and corn.  Peas, beans, and lentils.  Milk and yogurt.  Fruit and juice.  Desserts, such as cakes, cookies, ice cream, and candy. How does alcohol affect me? Alcohol can cause a sudden decrease in blood glucose (hypoglycemia), especially if you use insulin or take certain oral diabetes medicines. Hypoglycemia can be a life-threatening condition. Symptoms of hypoglycemia (sleepiness, dizziness, and confusion) are similar to symptoms of having too much alcohol. If your health care provider says that alcohol is safe for you, follow these guidelines:  Limit alcohol intake to no more than 1 drink per day for nonpregnant women and 2 drinks per day for men. One drink equals 12 oz of beer, 5 oz of wine, or 1 oz of hard liquor.  Do not  drink on an empty stomach.  Keep yourself hydrated with water, diet soda, or unsweetened iced tea.  Keep in mind that regular soda, juice, and other mixers may contain a lot of sugar and must be counted as carbs. What are tips for following this plan?  Reading food labels  Start by checking the serving size on the "Nutrition Facts" label of packaged foods and drinks. The amount of calories, carbs, fats, and other nutrients listed on the label is based on one serving of the item. Many items contain more than one serving per package.  Check the total grams (g) of carbs in one serving. You can calculate the number of servings of carbs in one serving by dividing the total carbs by 15. For example, if a food has 30 g of total carbs, it would be equal to 2 servings of carbs.  Check the number of grams (g) of saturated and trans fats in one serving. Choose foods that have low or no amount of these fats.  Check the number of milligrams (mg) of salt (sodium) in one serving. Most people should limit total sodium intake to less than 2,300 mg per day.  Always check the nutrition information of foods labeled as "low-fat" or "nonfat".  These foods may be higher in added sugar or refined carbs and should be avoided.  Talk to your dietitian to identify your daily goals for nutrients listed on the label. Shopping  Avoid buying canned, premade, or processed foods. These foods tend to be high in fat, sodium, and added sugar.  Shop around the outside edge of the grocery store. This includes fresh fruits and vegetables, bulk grains, fresh meats, and fresh dairy. Cooking  Use low-heat cooking methods, such as baking, instead of high-heat cooking methods like deep frying.  Cook using healthy oils, such as olive, canola, or sunflower oil.  Avoid cooking with butter, cream, or high-fat meats. Meal planning  Eat meals and snacks regularly, preferably at the same times every day. Avoid going long periods of  time without eating.  Eat foods high in fiber, such as fresh fruits, vegetables, beans, and whole grains. Talk to your dietitian about how many servings of carbs you can eat at each meal.  Eat 4-6 ounces (oz) of lean protein each day, such as lean meat, chicken, fish, eggs, or tofu. One oz of lean protein is equal to: ? 1 oz of meat, chicken, or fish. ? 1 egg. ?  cup of tofu.  Eat some foods each day that contain healthy fats, such as avocado, nuts, seeds, and fish. Lifestyle  Check your blood glucose regularly.  Exercise regularly as told by your health care provider. This may include: ? 150 minutes of moderate-intensity or vigorous-intensity exercise each week. This could be brisk walking, biking, or water aerobics. ? Stretching and doing strength exercises, such as yoga or weightlifting, at least 2 times a week.  Take medicines as told by your health care provider.  Do not use any products that contain nicotine or tobacco, such as cigarettes and e-cigarettes. If you need help quitting, ask your health care provider.  Work with a Social worker or diabetes educator to identify strategies to manage stress and any emotional and social challenges. Questions to ask a health care provider  Do I need to meet with a diabetes educator?  Do I need to meet with a dietitian?  What number can I call if I have questions?  When are the best times to check my blood glucose? Where to find more information:  American Diabetes Association: diabetes.org  Academy of Nutrition and Dietetics: www.eatright.[employer] of Diabetes and Digestive and Kidney Diseases (NIH): [employer] Summary  A healthy meal plan will help you control your blood glucose and maintain a healthy lifestyle.  Working with a diet and nutrition specialist (dietitian) can help you make a meal plan that is best for you.  Keep in mind that carbohydrates (carbs) and alcohol have immediate effects on your  blood glucose levels. It is important to count carbs and to use alcohol carefully. This information is not intended to replace advice given to you by your health care provider. Make sure you discuss any questions you have with your health care provider. Document Revised: 08/27/2017 Document Reviewed: 10/19/2016 Elsevier Patient Education  2020 Elsevier Inc.      Agustina Caroli, MD [facility]

## 2019-12-05 ENCOUNTER — Encounter: Payer: Self-pay | Admitting: Emergency Medicine

## 2019-12-05 LAB — COMPREHENSIVE METABOLIC PANEL
ALT: 19 IU/L (ref 0–32)
AST: 22 IU/L (ref 0–40)
Albumin/Globulin Ratio: 1.5 (ref 1.2–2.2)
Albumin: 4.5 g/dL (ref 3.8–4.8)
Alkaline Phosphatase: 98 IU/L (ref 39–117)
BUN/Creatinine Ratio: 14 (ref 12–28)
BUN: 12 mg/dL (ref 8–27)
Bilirubin Total: 0.4 mg/dL (ref 0.0–1.2)
CO2: 24 mmol/L (ref 20–29)
Calcium: 9.7 mg/dL (ref 8.7–10.3)
Chloride: 105 mmol/L (ref 96–106)
Creatinine, Ser: 0.83 mg/dL (ref 0.57–1.00)
GFR calc Af Amer: 86 mL/min/{1.73_m2} (ref >59)
GFR calc non Af Amer: 74 mL/min/{1.73_m2} (ref >59)
Globulin, Total: 3 g/dL (ref 1.5–4.5)
Glucose: 113 mg/dL — ABNORMAL HIGH (ref 65–99)
Potassium: 4.3 mmol/L (ref 3.5–5.2)
Sodium: 144 mmol/L (ref 134–144)
Total Protein: 7.5 g/dL (ref 6.0–8.5)

## 2019-12-05 LAB — CBC WITH DIFFERENTIAL/PLATELET
Basophils Absolute: 0.1 10*3/uL (ref 0.0–0.2)
Basos: 1 %
EOS (ABSOLUTE): 0.1 10*3/uL (ref 0.0–0.4)
Eos: 1 %
Hematocrit: 42.6 % (ref 34.0–46.6)
Hemoglobin: 14.4 g/dL (ref 11.1–15.9)
Immature Grans (Abs): 0 10*3/uL (ref 0.0–0.1)
Immature Granulocytes: 0 %
Lymphocytes Absolute: 2.7 10*3/uL (ref 0.7–3.1)
Lymphs: 56 %
MCH: 27.9 pg (ref 26.6–33.0)
MCHC: 33.8 g/dL (ref 31.5–35.7)
MCV: 82 fL (ref 79–97)
Monocytes Absolute: 0.4 10*3/uL (ref 0.1–0.9)
Monocytes: 8 %
Neutrophils Absolute: 1.6 10*3/uL (ref 1.4–7.0)
Neutrophils: 34 %
Platelets: 265 10*3/uL (ref 150–450)
RBC: 5.17 x10E6/uL (ref 3.77–5.28)
RDW: 15.3 % (ref 11.7–15.4)
WBC: 4.8 10*3/uL (ref 3.4–10.8)

## 2019-12-05 LAB — LIPID PANEL
Chol/HDL Ratio: 2.2 ratio (ref 0.0–4.4)
Cholesterol, Total: 115 mg/dL (ref 100–199)
HDL: 52 mg/dL (ref >39)
LDL Chol Calc (NIH): 46 mg/dL (ref 0–99)
Triglycerides: 91 mg/dL (ref 0–149)
VLDL Cholesterol Cal: 17 mg/dL (ref 5–40)

## 2019-12-19 ENCOUNTER — Encounter: Payer: Medicare PPO | Attending: Emergency Medicine | Admitting: Dietician

## 2019-12-19 ENCOUNTER — Encounter: Payer: Self-pay | Admitting: Emergency Medicine

## 2019-12-19 ENCOUNTER — Encounter: Payer: Self-pay | Admitting: Dietician

## 2019-12-19 ENCOUNTER — Other Ambulatory Visit: Payer: Self-pay

## 2019-12-19 DIAGNOSIS — E119 Type 2 diabetes mellitus without complications: Secondary | ICD-10-CM

## 2019-12-19 NOTE — Progress Notes (Signed)
Patient was seen on 12/19/19 for the first of a series of three diabetes self-management courses at [facility].  Patient Education Plan per assessed needs and concerns is to attend three course education program for Diabetes Self Management Education.  The following learning objectives were met by the patient during this class:  Describe diabetes, types of diabetes and pathophysiology  State some common risk factors for diabetes  Defines the role of glucose and insulin  Describe the relationship between diabetes and cardiovascular and other risks  State the members of the Healthcare Team  States the rationale for glucose monitoring and when to test  State their individual [place] the importance of logging glucose readings and how to interpret the readings  Identifies A1C target  Explain the correlation between A1c and eAG values  State symptoms and treatment of high blood glucose and low blood glucose  Explain proper technique for glucose testing and identify proper sharps disposal  Handouts given during class include:  How to Thrive:  A Guide for Your Journey with Diabetes by the ADA  Meal Plan Card and carbohydrate content list  Dietary intake form  Low Sodium Flavoring Tips  Types of Fats  Dining Out  Label reading  Snack list  Planning a balanced meal  The diabetes portion plate  Diabetes Resources  A1c to eAG Conversion Chart  Blood Glucose Log  Diabetes Recommended Care Schedule  Support Group  Diabetes Success Plan  Core Class Satisfaction Survey   [name] Plan:  Attend core 2

## 2019-12-26 ENCOUNTER — Other Ambulatory Visit: Payer: Self-pay

## 2019-12-26 ENCOUNTER — Encounter: Payer: Medicare PPO | Admitting: Dietician

## 2019-12-26 ENCOUNTER — Encounter: Payer: Self-pay | Admitting: Dietician

## 2019-12-26 DIAGNOSIS — E119 Type 2 diabetes mellitus without complications: Secondary | ICD-10-CM | POA: Diagnosis not present

## 2019-12-26 NOTE — Progress Notes (Signed)
Patient was seen on 12/26/2019 for the second of a series of three diabetes self-management courses at the [facility]. The following learning objectives were met by the patient during this class:   Describe the role of different macronutrients on glucose  Explain how carbohydrates affect blood glucose  State what foods contain the most carbohydrates  Demonstrate carbohydrate counting  Demonstrate how to read Nutrition Facts food label  Describe effects of various fats on heart health  Describe the importance of good nutrition for health and healthy eating strategies  Describe techniques for managing your shopping, cooking and meal planning  List strategies to follow meal plan when dining out  Describe the effects of alcohol on glucose and how to use it safely  Goals:  Follow Diabetes Meal Plan as instructed  Aim to spread carbs evenly throughout the day  Aim for 3 meals per day and snacks as needed Include lean protein foods to meals/snacks  Monitor glucose levels as instructed by your doctor   [name] Plan:  Attend Core 3  Work towards following your personal food plan.

## 2019-12-27 ENCOUNTER — Encounter: Payer: Self-pay | Admitting: Emergency Medicine

## 2020-01-23 ENCOUNTER — Other Ambulatory Visit: Payer: Self-pay

## 2020-01-23 ENCOUNTER — Encounter: Payer: Self-pay | Admitting: Dietician

## 2020-01-23 ENCOUNTER — Encounter: Payer: Medicare PPO | Attending: Emergency Medicine | Admitting: Dietician

## 2020-01-23 DIAGNOSIS — E119 Type 2 diabetes mellitus without complications: Secondary | ICD-10-CM | POA: Diagnosis not present

## 2020-01-23 NOTE — Progress Notes (Signed)
Patient was seen on 01/23/20 for the third of a series of three diabetes self-management courses at the [facility].   Katherine [name] the amount of activity recommended for healthy living . Describe activities suitable for individual needs . Identify ways to regularly incorporate activity into daily life . Identify barriers to activity and ways to over come these barriers  Identify diabetes medications being personally used and their primary action for lowering glucose and possible side effects . Describe role of stress on blood glucose and develop strategies to address psychosocial issues . Identify diabetes complications and ways to prevent them  Explain how to manage diabetes during illness . Evaluate success in meeting personal goal . Establish 2-3 goals that they will plan to diligently work on  Goals:   I will count my carb choices at most meals and snacks  I will be active 40 minutes or more 5 times a week and consistently continue to be physically active  I will eat less unhealthy fats  I will test my glucose at least 2 times a day  Your patient has identified these potential barriers to change:  none Your patient has identified their diabetes self-care support plan as   [facility] Support Group     Plan:  Attend Support Group as desired

## 2020-02-22 ENCOUNTER — Other Ambulatory Visit: Payer: Self-pay | Admitting: Emergency Medicine

## 2020-02-22 DIAGNOSIS — Z1231 Encounter for screening mammogram for malignant neoplasm of breast: Secondary | ICD-10-CM

## 2020-03-02 IMAGING — MG DIGITAL SCREENING BILATERAL MAMMOGRAM WITH TOMO AND CAD
6 of 11 series · 6 of 35 positions shown · non-contrast
Comparison: Previous exam(s).

CLINICAL DATA: Screening.

EXAM:
DIGITAL SCREENING BILATERAL MAMMOGRAM WITH TOMO AND CAD

[R MLO synth-2D (1 of 2)]
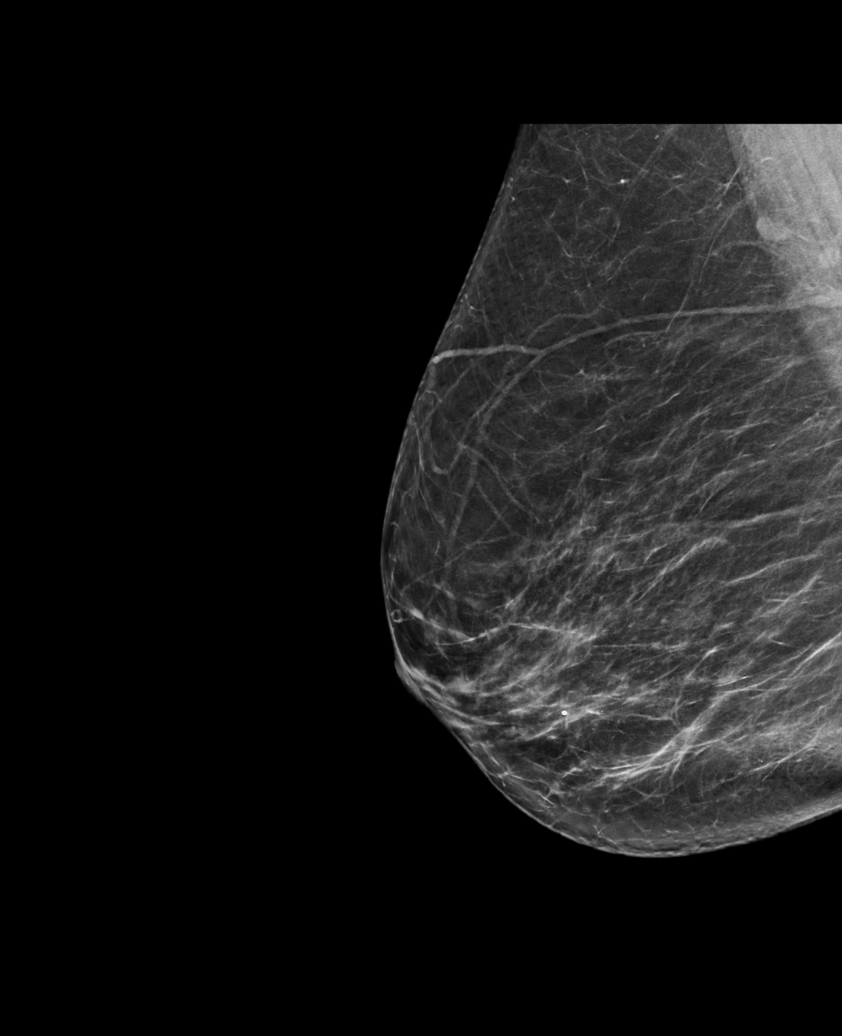

[L MLO synth-2D (1 of 2)]
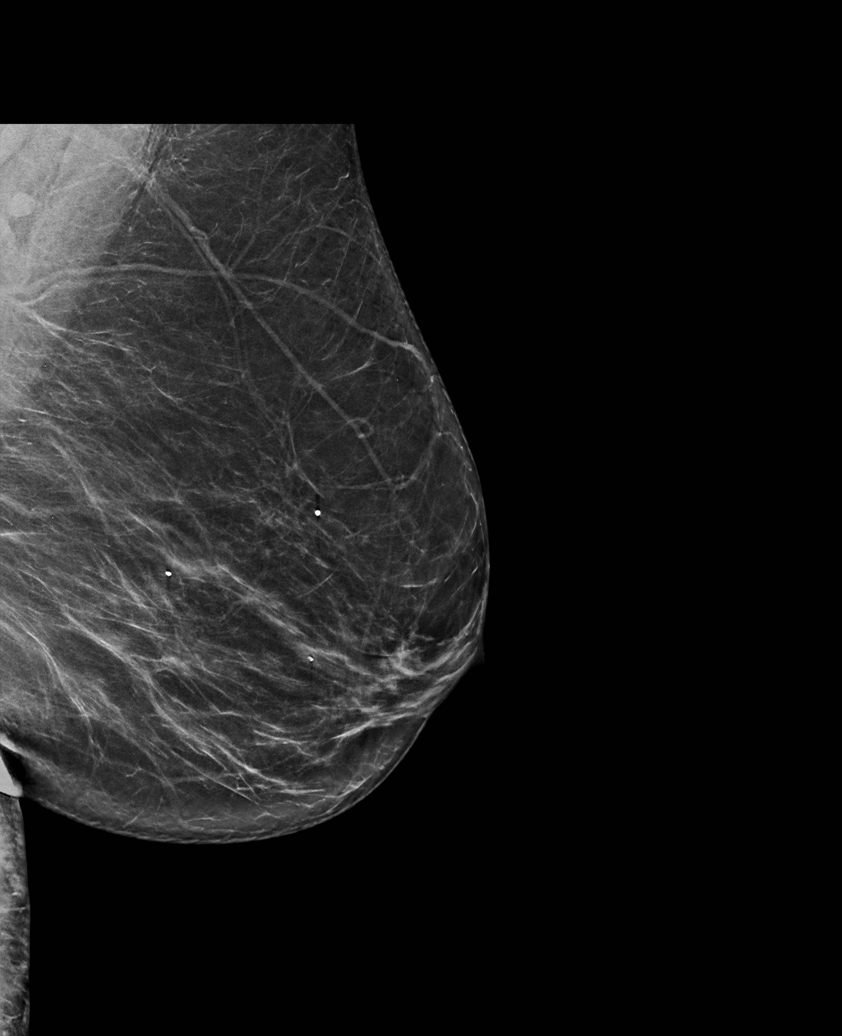

[R CC synth-2D]
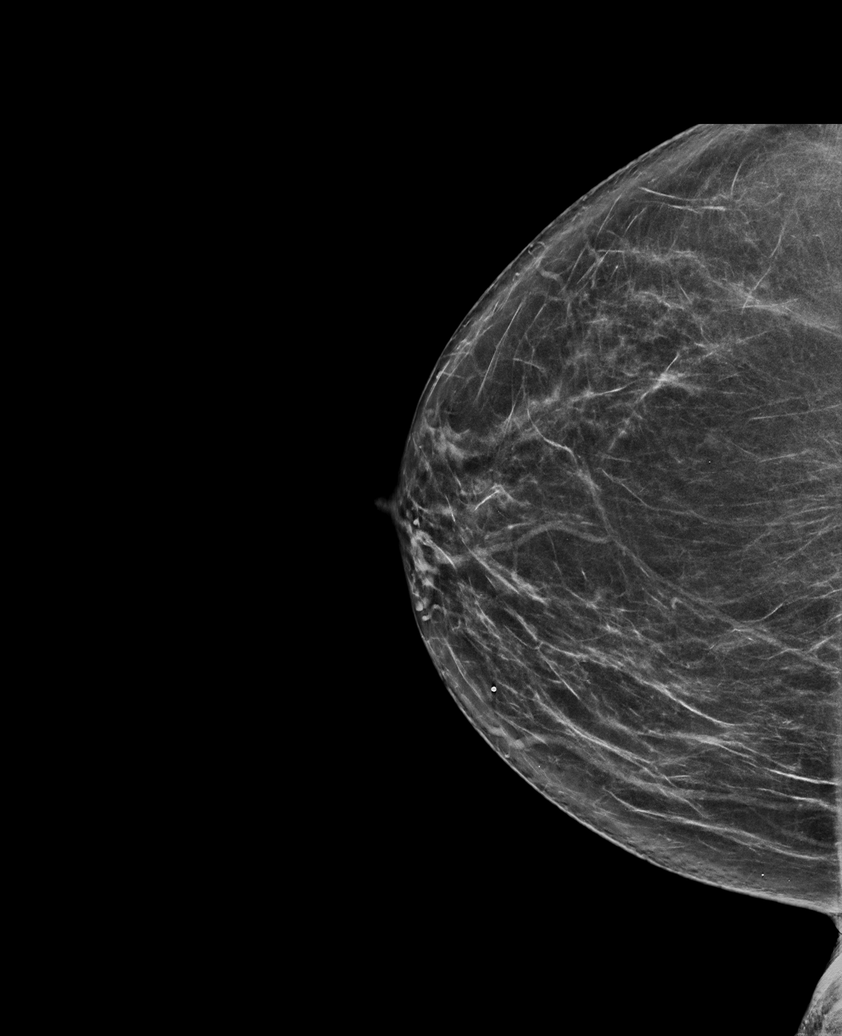

[L MLO synth-2D (2 of 2)]
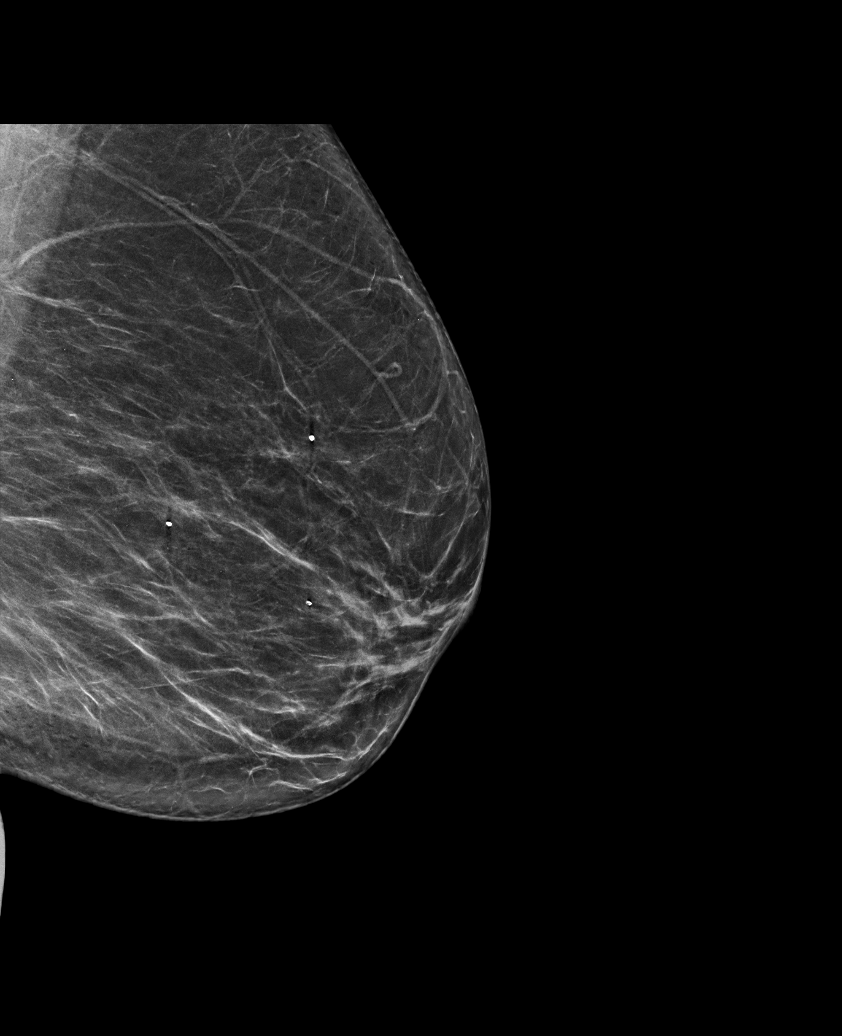

[R MLO synth-2D (2 of 2)]
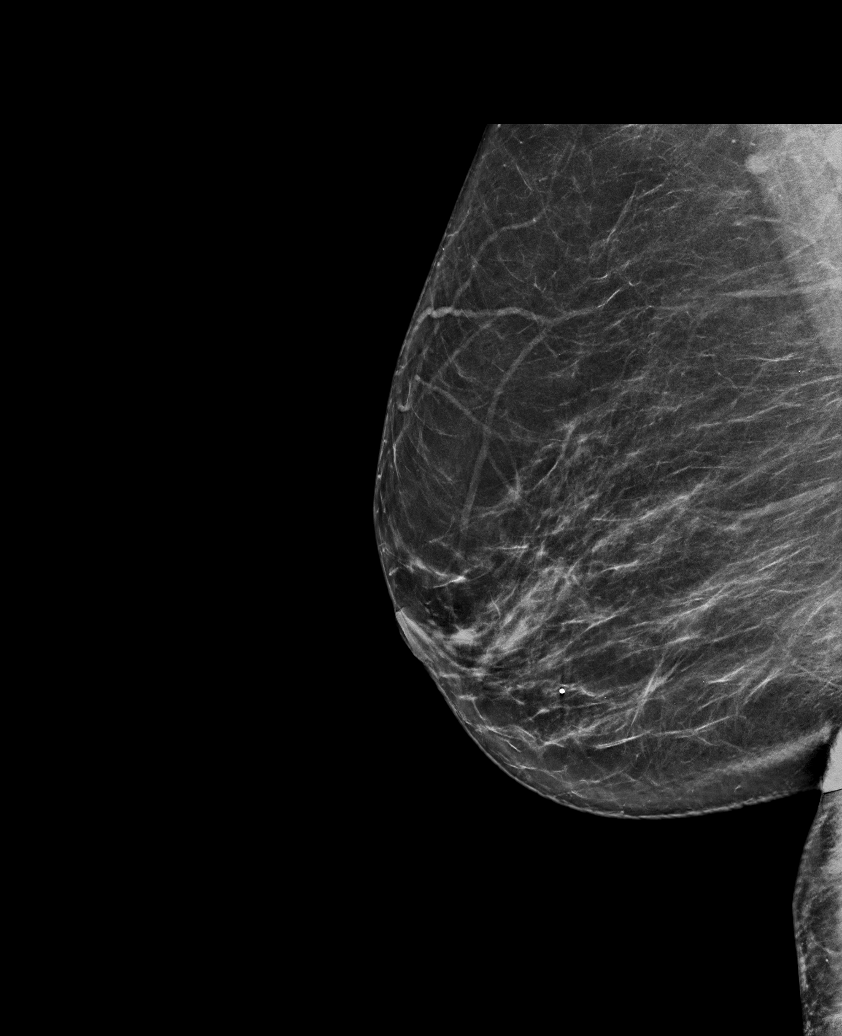

[R MLO tomo · tomo slice 40/79.0]
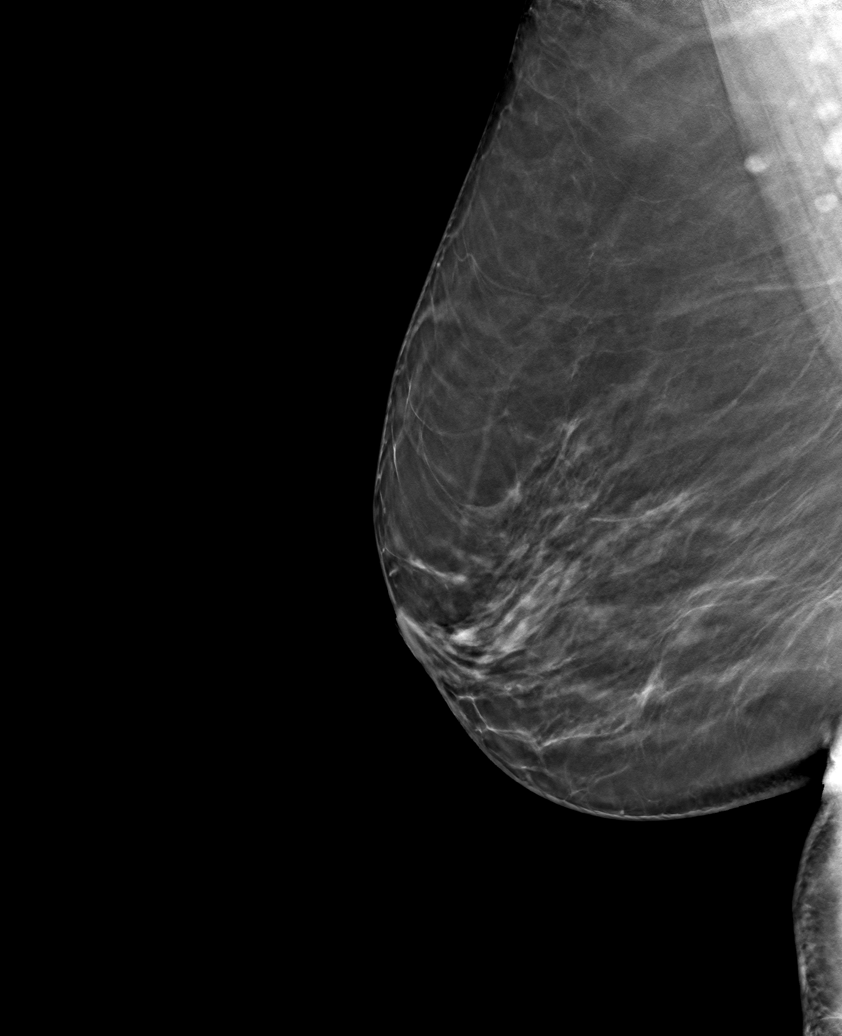

[6 of 35 positions shown; findings below may reference images not displayed]

ACR Breast Density Category b: There are scattered areas of
fibroglandular density.
FINDINGS: There are no findings suspicious for malignancy. Images were
processed with CAD.
IMPRESSION: No mammographic evidence of malignancy. A result letter of this
screening mammogram will be mailed directly to the patient.

RECOMMENDATION:
Screening mammogram in one year. (Code:[id number])

BI-RADS CATEGORY  1: Negative.

## 2020-03-12 DIAGNOSIS — H524 Presbyopia: Secondary | ICD-10-CM | POA: Diagnosis not present

## 2020-03-12 DIAGNOSIS — E119 Type 2 diabetes mellitus without complications: Secondary | ICD-10-CM | POA: Diagnosis not present

## 2020-03-28 ENCOUNTER — Other Ambulatory Visit: Payer: Self-pay

## 2020-03-28 ENCOUNTER — Ambulatory Visit: Payer: Medicare PPO

## 2020-03-28 ENCOUNTER — Ambulatory Visit
Admission: RE | Admit: 2020-03-28 | Discharge: 2020-03-28 | Disposition: A | Discharge status: Home / Self Care | Payer: Medicare PPO | Source: Ambulatory Visit | Attending: Emergency Medicine | Admitting: Emergency Medicine

## 2020-03-28 DIAGNOSIS — Z1231 Encounter for screening mammogram for malignant neoplasm of breast: Secondary | ICD-10-CM

## 2020-05-20 ENCOUNTER — Encounter: Payer: Self-pay | Admitting: Emergency Medicine

## 2020-05-20 ENCOUNTER — Other Ambulatory Visit: Payer: Self-pay

## 2020-05-20 ENCOUNTER — Ambulatory Visit: Payer: Medicare PPO | Admitting: Emergency Medicine

## 2020-05-20 VITALS — BP 127/76 | HR 79 | Temp 97.5°F | Resp 16 | Ht 65.0 in | Wt 236.0 lb

## 2020-05-20 DIAGNOSIS — R1032 Left lower quadrant pain: Secondary | ICD-10-CM | POA: Diagnosis not present

## 2020-05-20 DIAGNOSIS — Z8719 Personal history of other diseases of the digestive system: Secondary | ICD-10-CM | POA: Diagnosis not present

## 2020-05-20 DIAGNOSIS — K5792 Diverticulitis of intestine, part unspecified, without perforation or abscess without bleeding: Secondary | ICD-10-CM

## 2020-05-20 LAB — POCT URINALYSIS DIP (MANUAL ENTRY)
Glucose, UA: NEGATIVE mg/dL
Leukocytes, UA: NEGATIVE
Nitrite, UA: NEGATIVE
Spec Grav, UA: 1.025 (ref 1.010–1.025)
Urobilinogen, UA: 1 E.U./dL
pH, UA: 5.5 (ref 5.0–8.0)

## 2020-05-20 MED ORDER — CIPROFLOXACIN HCL 500 MG PO TABS: 500.0000 mg | ORAL_TABLET | Freq: Two times a day (BID) | ORAL | 0 refills | Status: DC

## 2020-05-20 MED ORDER — METRONIDAZOLE 500 MG PO TABS: 500.0000 mg | ORAL_TABLET | Freq: Two times a day (BID) | ORAL | 0 refills | Status: AC

## 2020-05-20 NOTE — Progress Notes (Signed)
[name] 64 y.o.   Chief Complaint  Patient presents with  . Flank Pain    x 3 weeks per patient on the LEFT side and hurts to the touch    HISTORY OF PRESENT ILLNESS: This is a 64 y.o. female complaining of left-sided abdominal pain that started 3 weeks ago and has been progressively getting worse.  Still able to eat and drink.  Slightly nauseous but no vomiting.  Denies fever or chills.  Able to move her bowels.  No blood in the stools.  Denies urinary symptoms. Colonoscopy done in 2019 showed several polyps and also diverticulosis of sigmoid colon. No other complaints or medical concerns today. Has history of hypertension and diabetes.  HPI   Prior to Admission medications   Medication Sig Start Date End Date Taking? Authorizing Provider  amLODipine (NORVASC) 5 MG tablet Take 1 tablet (5 mg total) by mouth daily. 06/26/19  Yes SagardiaInes Bloomer, MD  aspirin EC 81 MG tablet Take 81 mg by mouth daily.   Yes [provider]  Biotin 1000 MCG tablet Take 1,000 mcg by mouth 3 (three) times daily.   Yes [provider]  Calcium Carb-Cholecalciferol (CALCIUM 600-D PO) Take by mouth.   Yes [provider]  Cholecalciferol (VITAMIN D3 PO) Take by mouth.   Yes [provider]  metFORMIN (GLUCOPHAGE) 500 MG tablet Take 1 tablet (500 mg total) by mouth 2 (two) times daily with a meal. 06/06/19  Yes Nubia Ziesmer, [name], MD  Multiple Vitamin (MULTIVITAMIN) tablet Take 1 tablet by mouth daily.   Yes [provider]  Omega-3 Fatty Acids (OMEGA-3 2100 PO) Take by mouth daily.   Yes [provider]  rosuvastatin (CRESTOR) 10 MG tablet Take 1 tablet (10 mg total) by mouth daily. 12/04/19  Yes Joni Colegrove, [name], MD  vitamin B-12 (CYANOCOBALAMIN) 1000 MCG tablet Take 1,000 mcg by mouth daily.   Yes [provider]  blood glucose meter kit and supplies KIT Dispense based on patient and insurance preference. Use up to four times  daily as directed. (FOR ICD-9 250.00, 250.01). 06/20/18   [name], MD  cyclobenzaprine (FLEXERIL) 10 MG tablet Take 1 tablet (10 mg total) by mouth at bedtime. 10/19/19   [name], MD  glucose blood (ONE TOUCH ULTRA TEST) test strip USE  STRIP TO CHECK GLUCOSE 4 TIMES DAILY AS DIRECTED 11/28/18   Vergie Zahm, [name], MD  Lancets [facility] DELICA PLUS [id number]) MISC Inject 1 Stick into the skin 3 (three) times daily as needed. 06/06/19 09/04/19  [name], MD    Allergies  Allergen Reactions  . Ace Inhibitors Cough  . Egg Yolk Other (See Comments)    vomiting  . Adhesive [Tape] Rash    Patient Active Problem List   Diagnosis Date Noted  . Body mass index (BMI) of 40.1-44.9 in adult ([place]) 12/04/2019  . Class 2 severe obesity due to excess calories with serious comorbidity and body mass index (BMI) of 35.0 to 35.9 in adult [facility]) 10/19/2019  . Osteoarthritis of spine with radiculopathy, cervical region 11/30/2017  . Other cervical disc degeneration, unspecified cervical region 07/17/2017  . Arthritis 06/23/2014  . Prediabetes 10/20/2013  . Obesity (BMI 35.0-39.9 without comorbidity) 10/20/2013  . Hypertension associated with diabetes [facility])     Past Medical History:  Diagnosis Date  . Diabetes mellitus without complication ([place])   . Hypertension     Past Surgical History:  Procedure Laterality Date  .  APPENDECTOMY    . CHOLECYSTECTOMY    . rotator cuff surgery Right 07/2014    Social History   Socioeconomic History  . Marital status: Married    Spouse name: Not on file  . Number of children: 2  . Years of education: Masters   . Highest education level: Not on file  Occupational History  . Occupation: Pharmacist, hospital  Tobacco Use  . Smoking status: Never Smoker  . Smokeless tobacco: Never Used  Vaping Use  . Vaping Use: Never used  Substance and Sexual Activity  . Alcohol use: Yes    Alcohol/week: 0.0 standard drinks    Comment: social  .  Drug use: No  . Sexual activity: Yes  Other Topics Concern  . Not on file  Social History Narrative   Patient has had 2 pregnancies and has 2 living children.   Social Determinants of Health   Financial Resource Strain:   . Difficulty of Paying Living Expenses: Not on file  Food Insecurity:   . Worried About Charity fundraiser in the Last Year: Not on file  . Ran Out of Food in the Last Year: Not on file  Transportation Needs:   . Lack of Transportation (Medical): Not on file  . Lack of Transportation (Non-Medical): Not on file  Physical Activity:   . Days of Exercise per Week: Not on file  . Minutes of Exercise per Session: Not on file  Stress:   . Feeling of Stress : Not on file  Social Connections:   . Frequency of Communication with Friends and Family: Not on file  . Frequency of Social Gatherings with Friends and Family: Not on file  . Attends Religious Services: Not on file  . Active Member of Clubs or Organizations: Not on file  . Attends Archivist Meetings: Not on file  . Marital Status: Not on file  Intimate Partner Violence:   . Fear of Current or Ex-Partner: Not on file  . Emotionally Abused: Not on file  . Physically Abused: Not on file  . Sexually Abused: Not on file    Family History  Problem Relation Age of Onset  . Diabetes Father   . Hypertension Father   . Sickle cell anemia Brother   . Cancer Brother 15       Leukemia, passed away at 8  . Cancer Maternal Grandmother        breast cancer  . Breast cancer Maternal Grandmother      Review of Systems  Constitutional: Negative.  Negative for chills and fever.  HENT: Negative.  Negative for congestion and sore throat.   Respiratory: Negative.  Negative for cough and shortness of breath.   Cardiovascular: Negative.  Negative for chest pain and palpitations.  Gastrointestinal: Positive for abdominal pain. Negative for blood in stool, constipation, diarrhea, melena, nausea and vomiting.   Genitourinary: Negative.  Negative for dysuria and hematuria.  Musculoskeletal: Negative.  Negative for back pain, myalgias and neck pain.  Skin: Negative.  Negative for rash.  Neurological: Negative.  Negative for dizziness and headaches.  All other systems reviewed and are negative.   Today's Vitals   05/20/20 1628  BP: 127/76  Pulse: 79  Resp: 16  Temp: (!) 97.5 F (36.4 C)  TempSrc: Temporal  SpO2: 98%  Weight: 236 lb (107 kg)  Height: 5' 5"  (1.651 m)   Body mass index is 39.27 kg/m.  Physical Exam Vitals reviewed.  Constitutional:      Appearance:  Normal appearance.  HENT:     Head: Normocephalic.  Eyes:     Extraocular Movements: Extraocular movements intact.     Pupils: Pupils are equal, round, and reactive to light.  Cardiovascular:     Rate and Rhythm: Normal rate and regular rhythm.     Pulses: Normal pulses.     Heart sounds: Normal heart sounds.  Pulmonary:     Effort: Pulmonary effort is normal.     Breath sounds: Normal breath sounds.  Abdominal:     General: Bowel sounds are normal.     Palpations: Abdomen is soft.     Tenderness: There is abdominal tenderness in the left upper quadrant and left lower quadrant. There is no right CVA tenderness, left CVA tenderness, guarding or rebound.     Hernia: No hernia is present.  Musculoskeletal:     Cervical back: Normal range of motion and neck supple.  Neurological:     Mental Status: She is alert.    Results for orders placed or performed in visit on 05/20/20 (from the past 24 hour(s))  POCT urinalysis dipstick     Status: Abnormal   Collection Time: 05/20/20  4:57 PM  Result Value Ref Range   Color, UA yellow yellow   Clarity, UA clear clear   Glucose, UA negative negative mg/dL   Bilirubin, UA small (A) negative   Ketones, POC UA trace (5) (A) negative mg/dL   Spec Grav, UA 1.025 1.010 - 1.025   Blood, UA small (A) negative   pH, UA 5.5 5.0 - 8.0   Protein Ur, POC trace (A) negative mg/dL    Urobilinogen, UA 1.0 0.2 or 1.0 E.U./dL   Nitrite, UA Negative Negative   Leukocytes, UA Negative Negative    A total of 30 minutes was spent with the patient, greater than 50% of which was in counseling/coordination of care regarding differential diagnosis including possibility of acute diverticulitis, treatment and management, review of colonoscopy report from 2019, review of most recent office visit notes, review of most recent blood work results, diet and nutrition, need for diagnostic CT scan of abdomen and pelvis, prognosis and need for follow-up.   ASSESSMENT & PLAN: Latha was seen today for flank pain.  Diagnoses and all orders for this visit:  Acute diverticulitis -     Comprehensive metabolic panel -     CBC with Differential/Platelet -     ciprofloxacin (CIPRO) 500 MG tablet; Take 1 tablet (500 mg total) by mouth 2 (two) times daily for 7 days. -     metroNIDAZOLE (FLAGYL) 500 MG tablet; Take 1 tablet (500 mg total) by mouth 2 (two) times daily for 7 days.  Left lower quadrant pain -     POCT urinalysis dipstick  History of diverticulosis  Left lower quadrant abdominal pain -     CT Abdomen Pelvis Wo Contrast; Future    Patient Instructions       If you have lab work done today you will be contacted with your lab results within the next 2 weeks.  If you have not heard from [place] then please contact us. The fastest way to get your results is to register for My Chart.   IF you received an x-ray today, you will receive an invoice from [facility]. Please contact [facility] at [phone] with questions or concerns regarding your invoice.   IF you received labwork today, you will receive an invoice from [place]. Please contact [facility] at [phone] with questions  or concerns regarding your invoice.   Our billing staff will not be able to assist you with questions regarding bills from these companies.  You will be contacted with the lab results  as soon as they are available. The fastest way to get your results is to activate your My Chart account. Instructions are located on the last page of this paperwork. If you have not heard from [place] regarding the results in 2 weeks, please contact this office.     Diverticulitis  Diverticulitis is when small pockets in your large intestine (colon) get infected or swollen. This causes stomach pain and watery poop (diarrhea). These pouches are called diverticula. They form in people who have a condition called diverticulosis. Follow these instructions at home: Medicines  Take over-the-counter and prescription medicines only as told by your doctor. These include: ? Antibiotics. ? Pain medicines. ? Fiber pills. ? Probiotics. ? Stool softeners.  Do not drive or use heavy machinery while taking prescription pain medicine.  If you were prescribed an antibiotic, take it as told. Do not stop taking it even if you feel better. General instructions   Follow a diet as told by your doctor.  When you feel better, your doctor may tell you to change your diet. You may need to eat a lot of fiber. Fiber makes it easier to poop (have bowel movements). Healthy foods with fiber include: ? Berries. ? Beans. ? Lentils. ? Green vegetables.  Exercise 3 or more times a week. Aim for 30 minutes each time. Exercise enough to sweat and make your heart beat faster.  Keep all follow-up visits as told. This is important. You may need to have an exam of the large intestine. This is called a colonoscopy. Contact a doctor if:  Your pain does not get better.  You have a hard time eating or drinking.  You are not pooping like normal. Get help right away if:  Your pain gets worse.  Your problems do not get better.  Your problems get worse very fast.  You have a fever.  You throw up (vomit) more than one time.  You have poop that is: ? Bloody. ? Black. ? Tarry. Summary  Diverticulitis is when small  pockets in your large intestine (colon) get infected or swollen.  Take medicines only as told by your doctor.  Follow a diet as told by your doctor. This information is not intended to replace advice given to you by your health care provider. Make sure you discuss any questions you have with your health care provider. Document Revised: 08/27/2017 Document Reviewed: 10/01/2016 Elsevier Patient Education  2020 Elsevier Inc.      Agustina Caroli, MD [facility]

## 2020-05-20 NOTE — Patient Instructions (Addendum)
     If you have lab work done today you will be contacted with your lab results within the next 2 weeks.  If you have not heard from us then please contact us. The fastest way to get your results is to register for My Chart.   IF you received an x-ray today, you will receive an invoice from [facility]. Please contact [facility] at [phone] with questions or concerns regarding your invoice.   IF you received labwork today, you will receive an invoice from [facility]. Please contact [facility] at [phone] with questions or concerns regarding your invoice.   Our billing staff will not be able to assist you with questions regarding bills from these companies.  You will be contacted with the lab results as soon as they are available. The fastest way to get your results is to activate your My Chart account. Instructions are located on the last page of this paperwork. If you have not heard from us regarding the results in 2 weeks, please contact this office.     Diverticulitis  Diverticulitis is when small pockets in your large intestine (colon) get infected or swollen. This causes stomach pain and watery poop (diarrhea). These pouches are called diverticula. They form in people who have a condition called diverticulosis. Follow these instructions at home: Medicines  Take over-the-counter and prescription medicines only as told by your doctor. These include: ? Antibiotics. ? Pain medicines. ? Fiber pills. ? Probiotics. ? Stool softeners.  Do not drive or use heavy machinery while taking prescription pain medicine.  If you were prescribed an antibiotic, take it as told. Do not stop taking it even if you feel better. General instructions   Follow a diet as told by your doctor.  When you feel better, your doctor may tell you to change your diet. You may need to eat a lot of fiber. Fiber makes it easier to poop (have bowel movements). Healthy foods with fiber  include: ? Berries. ? Beans. ? Lentils. ? Green vegetables.  Exercise 3 or more times a week. Aim for 30 minutes each time. Exercise enough to sweat and make your heart beat faster.  Keep all follow-up visits as told. This is important. You may need to have an exam of the large intestine. This is called a colonoscopy. Contact a doctor if:  Your pain does not get better.  You have a hard time eating or drinking.  You are not pooping like normal. Get help right away if:  Your pain gets worse.  Your problems do not get better.  Your problems get worse very fast.  You have a fever.  You throw up (vomit) more than one time.  You have poop that is: ? Bloody. ? Black. ? Tarry. Summary  Diverticulitis is when small pockets in your large intestine (colon) get infected or swollen.  Take medicines only as told by your doctor.  Follow a diet as told by your doctor. This information is not intended to replace advice given to you by your health care provider. Make sure you discuss any questions you have with your health care provider. Document Revised: 08/27/2017 Document Reviewed: 10/01/2016 Elsevier Patient Education  2020 Elsevier Inc.  

## 2020-05-21 LAB — CBC WITH DIFFERENTIAL/PLATELET
Basophils Absolute: 0.1 10*3/uL (ref 0.0–0.2)
Basos: 1 %
EOS (ABSOLUTE): 0.1 10*3/uL (ref 0.0–0.4)
Eos: 2 %
Hematocrit: 41.5 % (ref 34.0–46.6)
Hemoglobin: 13.9 g/dL (ref 11.1–15.9)
Immature Grans (Abs): 0 10*3/uL (ref 0.0–0.1)
Immature Granulocytes: 0 %
Lymphocytes Absolute: 2.9 10*3/uL (ref 0.7–3.1)
Lymphs: 52 %
MCH: 28 pg (ref 26.6–33.0)
MCHC: 33.5 g/dL (ref 31.5–35.7)
MCV: 84 fL (ref 79–97)
Monocytes Absolute: 0.4 10*3/uL (ref 0.1–0.9)
Monocytes: 7 %
Neutrophils Absolute: 2.1 10*3/uL (ref 1.4–7.0)
Neutrophils: 38 %
Platelets: 246 10*3/uL (ref 150–450)
RBC: 4.96 x10E6/uL (ref 3.77–5.28)
RDW: 15.2 % (ref 11.7–15.4)
WBC: 5.5 10*3/uL (ref 3.4–10.8)

## 2020-05-21 LAB — COMPREHENSIVE METABOLIC PANEL
ALT: 22 IU/L (ref 0–32)
AST: 19 IU/L (ref 0–40)
Albumin/Globulin Ratio: 1.3 (ref 1.2–2.2)
Albumin: 4.3 g/dL (ref 3.8–4.8)
Alkaline Phosphatase: 100 IU/L (ref 48–121)
BUN/Creatinine Ratio: 14 (ref 12–28)
BUN: 12 mg/dL (ref 8–27)
Bilirubin Total: 0.4 mg/dL (ref 0.0–1.2)
CO2: 27 mmol/L (ref 20–29)
Calcium: 9.6 mg/dL (ref 8.7–10.3)
Chloride: 100 mmol/L (ref 96–106)
Creatinine, Ser: 0.87 mg/dL (ref 0.57–1.00)
GFR calc Af Amer: 80 mL/min/{1.73_m2} (ref >59)
GFR calc non Af Amer: 70 mL/min/{1.73_m2} (ref >59)
Globulin, Total: 3.3 g/dL (ref 1.5–4.5)
Glucose: 87 mg/dL (ref 65–99)
Potassium: 4 mmol/L (ref 3.5–5.2)
Sodium: 140 mmol/L (ref 134–144)
Total Protein: 7.6 g/dL (ref 6.0–8.5)

## 2020-05-23 ENCOUNTER — Encounter: Payer: Self-pay | Admitting: Emergency Medicine

## 2020-05-24 ENCOUNTER — Telehealth: Payer: Self-pay

## 2020-05-24 NOTE — Telephone Encounter (Signed)
Pt was seen on 05/20/20 for Acute Diverticulitis. She want told to go on liquid diet. Please advise on how long the pt should stay on this diet.

## 2020-05-24 NOTE — Telephone Encounter (Signed)
I have called pt and relayed the provider message and she stated understanding.

## 2020-05-24 NOTE — Telephone Encounter (Signed)
Pt. Called with questions for Dr. Alvy Bimler as to how long she was supposed to be on a clear liquid diet. Pt. Asserts she was told by the Dr. Not to eat solids but was not told for how long.   Pt also asserts she called with respect to this issue Wednesday but no documentation exists for such a call. Considering this assertion I have increase the priority of this message.

## 2020-05-24 NOTE — Telephone Encounter (Signed)
If she's feeling better she can start advancing her diet anytime now.  Dr. Alvy Bimler

## 2020-06-05 ENCOUNTER — Encounter: Payer: Self-pay | Admitting: Emergency Medicine

## 2020-06-05 ENCOUNTER — Ambulatory Visit: Payer: Medicare PPO | Admitting: Emergency Medicine

## 2020-06-05 ENCOUNTER — Other Ambulatory Visit: Payer: Self-pay

## 2020-06-05 VITALS — BP 128/84 | HR 86 | Temp 97.7°F | Resp 16 | Ht 65.0 in | Wt 237.0 lb

## 2020-06-05 DIAGNOSIS — E1159 Type 2 diabetes mellitus with other circulatory complications: Secondary | ICD-10-CM | POA: Diagnosis not present

## 2020-06-05 DIAGNOSIS — E1169 Type 2 diabetes mellitus with other specified complication: Secondary | ICD-10-CM

## 2020-06-05 DIAGNOSIS — E669 Obesity, unspecified: Secondary | ICD-10-CM | POA: Diagnosis not present

## 2020-06-05 DIAGNOSIS — Z23 Encounter for immunization: Secondary | ICD-10-CM

## 2020-06-05 DIAGNOSIS — Z6839 Body mass index (BMI) 39.0-39.9, adult: Secondary | ICD-10-CM | POA: Diagnosis not present

## 2020-06-05 DIAGNOSIS — I10 Essential (primary) hypertension: Secondary | ICD-10-CM

## 2020-06-05 DIAGNOSIS — M542 Cervicalgia: Secondary | ICD-10-CM | POA: Diagnosis not present

## 2020-06-05 LAB — HEMOGLOBIN A1C
Est. average glucose Bld gHb Est-mCnc: 126 mg/dL
Hgb A1c MFr Bld: 6 % — ABNORMAL HIGH (ref 4.8–5.6)

## 2020-06-05 NOTE — Assessment & Plan Note (Signed)
Well-controlled hypertension.  Continue present medication.  No changes. Continue Metformin. Diet and nutrition discussed. [name] in 3 to 6 months.

## 2020-06-05 NOTE — Progress Notes (Signed)
Wt Readings from Last 3 Encounters:  06/05/20 237 lb (107.5 kg)  05/20/20 236 lb (107 kg)  12/19/19 249 lb (112.9 kg)

## 2020-06-05 NOTE — Patient Instructions (Addendum)
   If you have lab work done today you will be contacted with your lab results within the next 2 weeks.  If you have not heard from us then please contact us. The fastest way to get your results is to register for My Chart.   IF you received an x-ray today, you will receive an invoice from [facility]. Please contact [facility] at [phone] with questions or concerns regarding your invoice.   IF you received labwork today, you will receive an invoice from [facility]. Please contact [facility] at [phone] with questions or concerns regarding your invoice.   Our billing staff will not be able to assist you with questions regarding bills from these companies.  You will be contacted with the lab results as soon as they are available. The fastest way to get your results is to activate your My Chart account. Instructions are located on the last page of this paperwork. If you have not heard from us regarding the results in 2 weeks, please contact this office.     Health Maintenance After Age 65 After age 65, you are at a higher risk for certain long-term diseases and infections as well as injuries from falls. Falls are a major cause of broken bones and head injuries in people who are older than age 65. Getting regular preventive care can help to keep you healthy and well. Preventive care includes getting regular testing and making lifestyle changes as recommended by your health care provider. Talk with your health care provider about:  Which screenings and tests you should have. A screening is a test that checks for a disease when you have no symptoms.  A diet and exercise plan that is right for you. What should I know about screenings and tests to prevent falls? Screening and testing are the best ways to find a health problem early. Early diagnosis and treatment give you the best chance of managing medical conditions that are common after age 65. Certain conditions and  lifestyle choices may make you more likely to have a fall. Your health care provider may recommend:  Regular vision checks. Poor vision and conditions such as cataracts can make you more likely to have a fall. If you wear glasses, make sure to get your prescription updated if your vision changes.  Medicine review. Work with your health care provider to regularly review all of the medicines you are taking, including over-the-counter medicines. Ask your health care provider about any side effects that may make you more likely to have a fall. Tell your health care provider if any medicines that you take make you feel dizzy or sleepy.  Osteoporosis screening. Osteoporosis is a condition that causes the bones to get weaker. This can make the bones weak and cause them to break more easily.  Blood pressure screening. Blood pressure changes and medicines to control blood pressure can make you feel dizzy.  Strength and balance checks. Your health care provider may recommend certain tests to check your strength and balance while standing, walking, or changing positions.  Foot health exam. Foot pain and numbness, as well as not wearing proper footwear, can make you more likely to have a fall.  Depression screening. You may be more likely to have a fall if you have a fear of falling, feel emotionally low, or feel unable to do activities that you used to do.  Alcohol use screening. Using too much alcohol can affect your balance and may make you more likely to   have a fall. What actions can I take to lower my risk of falls? General instructions  Talk with your health care provider about your risks for falling. Tell your health care provider if: ? You fall. Be sure to tell your health care provider about all falls, even ones that seem minor. ? You feel dizzy, sleepy, or off-balance.  Take over-the-counter and prescription medicines only as told by your health care provider. These include any  supplements.  Eat a healthy diet and maintain a healthy weight. A healthy diet includes low-fat dairy products, low-fat (lean) meats, and fiber from whole grains, beans, and lots of fruits and vegetables. Home safety  Remove any tripping hazards, such as rugs, cords, and clutter.  Install safety equipment such as grab bars in bathrooms and safety rails on stairs.  Keep rooms and walkways well-lit. Activity   Follow a regular exercise program to stay fit. This will help you maintain your balance. Ask your health care provider what types of exercise are appropriate for you.  If you need a cane or walker, use it as recommended by your health care provider.  Wear supportive shoes that have nonskid soles. Lifestyle  Do not drink alcohol if your health care provider tells you not to drink.  If you drink alcohol, limit how much you have: ? 0-1 drink a day for women. ? 0-2 drinks a day for men.  Be aware of how much alcohol is in your drink. In the [place] one drink equals one typical bottle of beer (12 oz), one-half glass of wine (5 oz), or one shot of hard liquor (1 oz).  Do not use any products that contain nicotine or tobacco, such as cigarettes and e-cigarettes. If you need help quitting, ask your health care provider. Summary  Having a healthy lifestyle and getting preventive care can help to protect your health and wellness after age 65.  Screening and testing are the best way to find a health problem early and help you avoid having a fall. Early diagnosis and treatment give you the best chance for managing medical conditions that are more common for people who are older than age 65.  Falls are a major cause of broken bones and head injuries in people who are older than age 65. Take precautions to prevent a fall at home.  Work with your health care provider to learn what changes you can make to improve your health and wellness and to prevent falls. This information is not intended  to replace advice given to you by your health care provider. Make sure you discuss any questions you have with your health care provider. Document Revised: 01/05/2019 Document Reviewed: 07/28/2017 Elsevier Patient Education  2020 Elsevier Inc.  

## 2020-06-05 NOTE — Progress Notes (Signed)
Katherine [name] 64 y.o.   Chief Complaint  Patient presents with  . Diabetes    follow up  . Neck Pain    per patient on the left side since 05/20/2020 last office visit    HISTORY OF PRESENT ILLNESS: This is a 64 y.o. female with history of hypertension and diabetes, here for follow-up.  Presently taking Metformin 500 mg twice a day and amlodipine 5 mg daily.  Also taking rosuvastatin 10 mg daily. Was seen by me on 05/20/2020 with suspected acute diverticulitis.  Took antibiotics as prescribed with much improvement. Scheduled for CT abdomen and pelvis next week. Also has question about shingles vaccine.  Had vaccination 8 to 9 years ago.  Inquiring about Shingrix.  Up to date recommends Shingrix if patient has had ZVL in the past. For patients who previously received ZVL, we suggest revaccination with RZV (Grade 2C). Such patients should receive the two-dose series, which should be initiated at least eight weeks after ZVL.  Also complaining of periodic left-sided neck pain for several weeks, worse with movement and palpation without radiation and not associated with any other symptoms. No other complaints or medical concerns today.   BP Readings from Last 3 Encounters:  06/05/20 128/84  05/20/20 127/76  12/04/19 126/76   Lab Results  Component Value Date   HGBA1C 6.5 (A) 12/04/2019   Lab Results  Component Value Date   CHOL 115 12/04/2019   HDL 52 12/04/2019   LDLCALC 46 12/04/2019   LDLDIRECT 113 (H) 10/11/2012   TRIG 91 12/04/2019   CHOLHDL 2.2 12/04/2019   Wt Readings from Last 3 Encounters:  06/05/20 237 lb (107.5 kg)  05/20/20 236 lb (107 kg)  12/19/19 249 lb (112.9 kg)     HPI   Prior to Admission medications   Medication Sig Start Date End Date Taking? Authorizing Provider  amLODipine (NORVASC) 5 MG tablet Take 1 tablet (5 mg total) by mouth daily. 06/26/19  Yes SagardiaInes Bloomer, MD  aspirin EC 81 MG tablet Take 81 mg by mouth daily.   Yes [provider]  Biotin 1000 MCG tablet Take 1,000 mcg by mouth 3 (three) times daily.   Yes [provider]  Calcium Carb-Cholecalciferol (CALCIUM 600-D PO) Take by mouth.   Yes [provider]  Cholecalciferol (VITAMIN D3 PO) Take by mouth.   Yes [provider]  metFORMIN (GLUCOPHAGE) 500 MG tablet Take 1 tablet (500 mg total) by mouth 2 (two) times daily with a meal. 06/06/19  Yes [name], [name], MD  Multiple Vitamin (MULTIVITAMIN) tablet Take 1 tablet by mouth daily.   Yes [provider]  Omega-3 Fatty Acids (OMEGA-3 2100 PO) Take by mouth daily.   Yes [provider]  OVER THE COUNTER MEDICATION 1,950 mg daily.   Yes [provider]  rosuvastatin (CRESTOR) 10 MG tablet Take 1 tablet (10 mg total) by mouth daily. 12/04/19  Yes [name], [name], MD  vitamin B-12 (CYANOCOBALAMIN) 1000 MCG tablet Take 1,000 mcg by mouth daily.   Yes [provider]  blood glucose meter kit and supplies KIT Dispense based on patient and insurance preference. Use up to four times daily as directed. (FOR ICD-9 250.00, 250.01). 06/20/18   [name], MD  glucose blood (ONE TOUCH ULTRA TEST) test strip USE  STRIP TO CHECK GLUCOSE 4 TIMES DAILY AS DIRECTED 11/28/18   [name], [name], MD  Lancets [facility] DELICA PLUS [id number]) MISC Inject 1 Stick into the  skin 3 (three) times daily as needed. 06/06/19 09/04/19  [name], MD    Allergies  Allergen Reactions  . Ace Inhibitors Cough  . Egg Yolk Other (See Comments)    vomiting  . Adhesive [Tape] Rash    Patient Active Problem List   Diagnosis Date Noted  . Body mass index (BMI) of 40.1-44.9 in adult ([place]) 12/04/2019  . Class 2 severe obesity due to excess calories with serious comorbidity and body mass index (BMI) of 35.0 to 35.9 in adult [facility]) 10/19/2019  . Osteoarthritis of spine with radiculopathy, cervical region 11/30/2017  . Other cervical disc degeneration,  unspecified cervical region 07/17/2017  . Arthritis 06/23/2014  . Prediabetes 10/20/2013  . Obesity (BMI 35.0-39.9 without comorbidity) 10/20/2013  . Hypertension associated with diabetes [facility])     Past Medical History:  Diagnosis Date  . Diabetes mellitus without complication ([place])   . Hypertension     Past Surgical History:  Procedure Laterality Date  . APPENDECTOMY    . CHOLECYSTECTOMY    . rotator cuff surgery Right 07/2014    Social History   Socioeconomic History  . Marital status: Married    Spouse name: Not on file  . Number of children: 2  . Years of education: Masters   . Highest education level: Not on file  Occupational History  . Occupation: Pharmacist, hospital  Tobacco Use  . Smoking status: Never Smoker  . Smokeless tobacco: Never Used  Vaping Use  . Vaping Use: Never used  Substance and Sexual Activity  . Alcohol use: Yes    Alcohol/week: 0.0 standard drinks    Comment: social  . Drug use: No  . Sexual activity: Yes  Other Topics Concern  . Not on file  Social History Narrative   Patient has had 2 pregnancies and has 2 living children.   Social Determinants of Health   Financial Resource Strain:   . Difficulty of Paying Living Expenses: Not on file  Food Insecurity:   . Worried About Charity fundraiser in the Last Year: Not on file  . Ran Out of Food in the Last Year: Not on file  Transportation Needs:   . Lack of Transportation (Medical): Not on file  . Lack of Transportation (Non-Medical): Not on file  Physical Activity:   . Days of Exercise per Week: Not on file  . Minutes of Exercise per Session: Not on file  Stress:   . Feeling of Stress : Not on file  Social Connections:   . Frequency of Communication with Friends and Family: Not on file  . Frequency of Social Gatherings with Friends and Family: Not on file  . Attends Religious Services: Not on file  . Active Member of Clubs or Organizations: Not on file  . Attends Archivist  Meetings: Not on file  . Marital Status: Not on file  Intimate Partner Violence:   . Fear of Current or Ex-Partner: Not on file  . Emotionally Abused: Not on file  . Physically Abused: Not on file  . Sexually Abused: Not on file    Family History  Problem Relation Age of Onset  . Diabetes Father   . Hypertension Father   . Sickle cell anemia Brother   . Cancer Brother 15       Leukemia, passed away at 98  . Cancer Maternal Grandmother        breast cancer  . Breast cancer Maternal Grandmother      Review  of Systems  Constitutional: Negative.  Negative for chills and fever.  HENT: Negative.  Negative for congestion and sore throat.   Respiratory: Negative.  Negative for cough and shortness of breath.   Cardiovascular: Negative for chest pain and palpitations.  Gastrointestinal: Negative.  Negative for abdominal pain, diarrhea, nausea and vomiting.  Genitourinary: Negative.  Negative for dysuria and hematuria.  Musculoskeletal: Positive for neck pain.  Skin: Negative.   Neurological: Negative for dizziness and headaches.  All other systems reviewed and are negative.  Today's Vitals   06/05/20 1120  BP: 128/84  Pulse: 86  Resp: 16  Temp: 97.7 F (36.5 C)  TempSrc: Temporal  SpO2: 97%  Weight: 237 lb (107.5 kg)  Height: 5' 5"  (1.651 m)   Body mass index is 39.44 kg/m. Wt Readings from Last 3 Encounters:  06/05/20 237 lb (107.5 kg)  05/20/20 236 lb (107 kg)  12/19/19 249 lb (112.9 kg)     Physical Exam Vitals reviewed.  Constitutional:      Appearance: Normal appearance.  HENT:     [name]: Normocephalic.  Eyes:     Extraocular Movements: Extraocular movements intact.     Pupils: Pupils are equal, round, and reactive to light.  Cardiovascular:     Rate and Rhythm: Normal rate and regular rhythm.     Pulses: Normal pulses.     Heart sounds: Normal heart sounds.  Pulmonary:     Effort: Pulmonary effort is normal.     Breath sounds: Normal breath sounds.   Abdominal:     Palpations: Abdomen is soft.     Tenderness: There is no abdominal tenderness.  Musculoskeletal:        General: Normal range of motion.     Cervical back: Normal range of motion and neck supple.  Skin:    General: Skin is warm and dry.  Neurological:     General: No focal deficit present.     Mental Status: She is alert and oriented to person, place, and time.  Psychiatric:        Mood and Affect: Mood normal.        Behavior: Behavior normal.    A total of 30 minutes was spent with the patient, greater than 50% of which was in counseling/coordination of care regarding diabetes and hypertension, cardiovascular risks associated with these conditions, review of all medications, review of most recent blood work results, review of most recent office visit notes, diet and nutrition, prognosis and need for follow-up.   ASSESSMENT & PLAN: Obesity, diabetes, and hypertension syndrome ([place]) Well-controlled hypertension.  Continue present medication.  No changes. Continue Metformin. Diet and nutrition discussed. [name] in 3 to 6 months.  Katherine [name] was seen today for diabetes and neck pain.  Diagnoses and all orders for this visit:  Obesity, diabetes, and hypertension syndrome ([place])  Hypertension associated with diabetes ([place]) -     Hemoglobin A1c  Need for prophylactic vaccination and inoculation against influenza -     Flu Vaccine QUAD 36+ mos IM -     Varicella-zoster vaccine IM (Shingrix)  Need for prophylactic vaccination and inoculation against varicella  Neck pain, musculoskeletal  Body mass index (BMI) of 39.0-39.9 in adult    Patient Instructions       If you have lab work done today you will be contacted with your lab results within the next 2 weeks.  If you have not heard from [place] then please contact us. The fastest way to get your results  is to register for My Chart.   IF you received an x-ray today, you will receive an invoice from  [facility]. Please contact [facility] at [phone] with questions or concerns regarding your invoice.   IF you received labwork today, you will receive an invoice from [place]. Please contact [facility] at [phone] with questions or concerns regarding your invoice.   Our billing staff will not be able to assist you with questions regarding bills from these companies.  You will be contacted with the lab results as soon as they are available. The fastest way to get your results is to activate your My Chart account. Instructions are located on the last page of this paperwork. If you have not heard from [place] regarding the results in 2 weeks, please contact this office.     Health Maintenance After Age 67 After age 31, you are at a higher risk for certain long-term diseases and infections as well as injuries from falls. Falls are a major cause of broken bones and [name] injuries in people who are older than age 26. Getting regular preventive care can help to keep you healthy and well. Preventive care includes getting regular testing and making lifestyle changes as recommended by your health care provider. Talk with your health care provider about:  Which screenings and tests you should have. A screening is a test that checks for a disease when you have no symptoms.  A diet and exercise plan that is right for you. What should I know about screenings and tests to prevent falls? Screening and testing are the best ways to find a health problem early. Early diagnosis and treatment give you the best chance of managing medical conditions that are common after age 42. Certain conditions and lifestyle choices may make you more likely to have a fall. Your health care provider may recommend:  Regular vision checks. Poor vision and conditions such as cataracts can make you more likely to have a fall. If you wear glasses, make sure to get your prescription updated if your vision  changes.  Medicine review. Work with your health care provider to regularly review all of the medicines you are taking, including over-the-counter medicines. Ask your health care provider about any side effects that may make you more likely to have a fall. Tell your health care provider if any medicines that you take make you feel dizzy or sleepy.  Osteoporosis screening. Osteoporosis is a condition that causes the bones to get weaker. This can make the bones weak and cause them to break more easily.  Blood pressure screening. Blood pressure changes and medicines to control blood pressure can make you feel dizzy.  Strength and balance checks. Your health care provider may recommend certain tests to check your strength and balance while standing, walking, or changing positions.  Foot health exam. Foot pain and numbness, as well as not wearing proper footwear, can make you more likely to have a fall.  Depression screening. You may be more likely to have a fall if you have a fear of falling, feel emotionally low, or feel unable to do activities that you used to do.  Alcohol use screening. Using too much alcohol can affect your balance and may make you more likely to have a fall. What actions can I take to lower my risk of falls? General instructions  Talk with your health care provider about your risks for falling. Tell your health care provider if: ? You fall. Be sure to tell  your health care provider about all falls, even ones that seem minor. ? You feel dizzy, sleepy, or off-balance.  Take over-the-counter and prescription medicines only as told by your health care provider. These include any supplements.  Eat a healthy diet and maintain a healthy weight. A healthy diet includes low-fat dairy products, low-fat (lean) meats, and fiber from whole grains, beans, and lots of fruits and vegetables. Home safety  Remove any tripping hazards, such as rugs, cords, and clutter.  Install safety  equipment such as grab bars in bathrooms and safety rails on stairs.  Keep rooms and walkways well-lit. Activity   Follow a regular exercise program to stay fit. This will help you maintain your balance. Ask your health care provider what types of exercise are appropriate for you.  If you need a cane or walker, use it as recommended by your health care provider.  Wear supportive shoes that have nonskid soles. Lifestyle  Do not drink alcohol if your health care provider tells you not to drink.  If you drink alcohol, limit how much you have: ? 0-1 drink a day for women. ? 0-2 drinks a day for men.  Be aware of how much alcohol is in your drink. In the [place] one drink equals one typical bottle of beer (12 oz), one-half glass of wine (5 oz), or one shot of hard liquor (1 oz).  Do not use any products that contain nicotine or tobacco, such as cigarettes and e-cigarettes. If you need help quitting, ask your health care provider. Summary  Having a healthy lifestyle and getting preventive care can help to protect your health and wellness after age 64.  Screening and testing are the best way to find a health problem early and help you avoid having a fall. Early diagnosis and treatment give you the best chance for managing medical conditions that are more common for people who are older than age 91.  Falls are a major cause of broken bones and [name] injuries in people who are older than age 65. Take precautions to prevent a fall at home.  Work with your health care provider to learn what changes you can make to improve your health and wellness and to prevent falls. This information is not intended to replace advice given to you by your health care provider. Make sure you discuss any questions you have with your health care provider. Document Revised: 01/05/2019 Document Reviewed: 07/28/2017 Elsevier Patient Education  2020 Elsevier Inc.      Agustina Caroli, MD [facility]

## 2020-06-07 ENCOUNTER — Other Ambulatory Visit: Payer: Self-pay | Admitting: Emergency Medicine

## 2020-06-07 DIAGNOSIS — E1159 Type 2 diabetes mellitus with other circulatory complications: Secondary | ICD-10-CM

## 2020-06-12 ENCOUNTER — Other Ambulatory Visit: Payer: Self-pay

## 2020-06-12 ENCOUNTER — Ambulatory Visit (HOSPITAL_COMMUNITY)
Admission: RE | Admit: 2020-06-12 | Discharge: 2020-06-12 | Disposition: A | Discharge status: Home / Self Care | Payer: Medicare PPO | Source: Ambulatory Visit | Attending: Emergency Medicine | Admitting: Emergency Medicine

## 2020-06-12 DIAGNOSIS — K5730 Diverticulosis of large intestine without perforation or abscess without bleeding: Secondary | ICD-10-CM | POA: Diagnosis not present

## 2020-06-12 DIAGNOSIS — R1032 Left lower quadrant pain: Secondary | ICD-10-CM | POA: Diagnosis not present

## 2020-06-12 DIAGNOSIS — Z9049 Acquired absence of other specified parts of digestive tract: Secondary | ICD-10-CM | POA: Diagnosis not present

## 2020-06-13 ENCOUNTER — Other Ambulatory Visit: Payer: Self-pay | Admitting: Emergency Medicine

## 2020-06-13 ENCOUNTER — Encounter: Payer: Self-pay | Admitting: Emergency Medicine

## 2020-06-20 DIAGNOSIS — M2142 Flat foot [pes planus] (acquired), left foot: Secondary | ICD-10-CM | POA: Diagnosis not present

## 2020-06-20 DIAGNOSIS — E119 Type 2 diabetes mellitus without complications: Secondary | ICD-10-CM | POA: Diagnosis not present

## 2020-06-20 DIAGNOSIS — M76822 Posterior tibial tendinitis, left leg: Secondary | ICD-10-CM | POA: Diagnosis not present

## 2020-06-20 DIAGNOSIS — R2689 Other abnormalities of gait and mobility: Secondary | ICD-10-CM | POA: Diagnosis not present

## 2020-06-20 DIAGNOSIS — M792 Neuralgia and neuritis, unspecified: Secondary | ICD-10-CM | POA: Diagnosis not present

## 2020-06-20 DIAGNOSIS — M2141 Flat foot [pes planus] (acquired), right foot: Secondary | ICD-10-CM | POA: Diagnosis not present

## 2020-06-20 DIAGNOSIS — L603 Nail dystrophy: Secondary | ICD-10-CM | POA: Diagnosis not present

## 2020-07-01 DIAGNOSIS — L603 Nail dystrophy: Secondary | ICD-10-CM | POA: Diagnosis not present

## 2020-08-03 ENCOUNTER — Other Ambulatory Visit: Payer: Self-pay | Admitting: Emergency Medicine

## 2020-08-03 DIAGNOSIS — I10 Essential (primary) hypertension: Secondary | ICD-10-CM

## 2020-08-03 NOTE — Telephone Encounter (Signed)
Requested Prescriptions  Pending Prescriptions Disp Refills   amLODipine (NORVASC) 5 MG tablet [Pharmacy Med Name: amLODIPine Besylate 5 MG Oral Tablet] 90 tablet 0    Sig: Take 1 tablet by mouth once daily     Cardiovascular:  Calcium Channel Blockers Passed - 08/03/2020  1:32 PM      Passed - Last BP in normal range    BP Readings from Last 1 Encounters:  06/05/20 128/84         Passed - Valid encounter within last 6 months    Recent Outpatient Visits          1 month ago Obesity, diabetes, and hypertension syndrome (HCC)   Primary Care at [facility], Eilleen Kempf, MD   2 months ago Acute diverticulitis   Primary Care at [place], [place], MD   8 months ago Hypertension associated with diabetes [facility])   Primary Care at [facility], [place], MD   9 months ago Back pain of lumbar region with sciatica   Primary Care at [place], Eilleen Kempf, MD   1 year ago Routine general medical examination at a health care facility   Primary Care at [facility], Eilleen Kempf, MD      Future Appointments            In 3 months Sagardia, Eilleen Kempf, MD Primary Care at [place], [facility]

## 2020-08-13 ENCOUNTER — Encounter: Payer: Self-pay | Admitting: Emergency Medicine

## 2020-08-14 NOTE — Telephone Encounter (Signed)
I am not familiar with that supplement either.

## 2020-09-02 ENCOUNTER — Other Ambulatory Visit: Payer: Self-pay | Admitting: Emergency Medicine

## 2020-09-02 DIAGNOSIS — E1159 Type 2 diabetes mellitus with other circulatory complications: Secondary | ICD-10-CM

## 2020-09-02 DIAGNOSIS — I152 Hypertension secondary to endocrine disorders: Secondary | ICD-10-CM

## 2020-09-02 NOTE — Telephone Encounter (Signed)
Requested Prescriptions  Pending Prescriptions Disp Refills  . metFORMIN (GLUCOPHAGE) 500 MG tablet [Pharmacy Med Name: metFORMIN HCl 500 MG Oral Tablet] 180 tablet 0    Sig: TAKE 1 TABLET BY MOUTH TWICE DAILY WITH A MEAL     Endocrinology:  Diabetes - Biguanides Passed - 09/02/2020  3:39 PM      Passed - Cr in normal range and within 360 days    Creat  Date Value Ref Range Status  07/23/2016 0.97 0.50 - 0.99 mg/dL Final    Comment:      For patients > or = 66 years of age: The upper reference limit for Creatinine is approximately 13% higher for people identified as African-American.      Creatinine, Ser  Date Value Ref Range Status  05/20/2020 0.87 0.57 - 1.00 mg/dL Final   Creatinine, Urine  Date Value Ref Range Status  07/23/2016 234 20 - 320 mg/dL Final         Passed - HBA1C is between 0 and 7.9 and within 180 days    Hgb A1c MFr Bld  Date Value Ref Range Status  06/05/2020 6.0 (H) 4.8 - 5.6 % Final    Comment:             Prediabetes: 5.7 - 6.4          Diabetes: >6.4          Glycemic control for adults with diabetes: <7.0          Passed - AA eGFR in normal range and within 360 days    GFR, Est African American  Date Value Ref Range Status  10/11/2012 79 mL/min Final   GFR calc Af Amer  Date Value Ref Range Status  05/20/2020 80 >59 mL/min/1.73 Final    Comment:    **[facility] currently reports eGFR in compliance with the current**   recommendations of the [employer]. [facility] will   update reporting as new guidelines are published from the NKF-ASN   Task force.    GFR, Est Non African American  Date Value Ref Range Status  10/11/2012 69 mL/min Final    Comment:      The estimated GFR is a calculation valid for adults (64 to 66 years old) that uses the CKD-EPI algorithm to adjust for age and sex. It is not to be used for children, pregnant women, hospitalized patients, patients on dialysis, or with rapidly changing kidney  function. According to the NKDEP, eGFR >89 is normal, 60-89 shows mild impairment, 30-59 shows moderate impairment, 15-29 shows severe impairment and <15 is ESRD.   GFR calc non Af Amer  Date Value Ref Range Status  05/20/2020 70 >59 mL/min/1.73 Final         Passed - Valid encounter within last 6 months    Recent Outpatient Visits          2 months ago Obesity, diabetes, and hypertension syndrome [facility])   Primary Care at [facility], [name], MD   3 months ago Acute diverticulitis   Primary Care at [place], [place], MD   9 months ago Hypertension associated with diabetes [facility])   Primary Care at [facility], [place], MD   10 months ago Back pain of lumbar region with sciatica   Primary Care at [place], [name], MD   1 year ago Routine general medical examination at a health care facility   Primary Care at [facility], [place], MD  Future Appointments            In 2 months Sagardia, [name], MD Primary Care at [place], [facility]

## 2020-09-04 ENCOUNTER — Ambulatory Visit: Payer: Medicare PPO | Admitting: Family Medicine

## 2020-09-04 ENCOUNTER — Encounter: Payer: Self-pay | Admitting: Family Medicine

## 2020-09-04 ENCOUNTER — Other Ambulatory Visit: Payer: Self-pay

## 2020-09-04 ENCOUNTER — Encounter: Payer: Self-pay | Admitting: Emergency Medicine

## 2020-09-04 DIAGNOSIS — K5792 Diverticulitis of intestine, part unspecified, without perforation or abscess without bleeding: Secondary | ICD-10-CM | POA: Diagnosis not present

## 2020-09-04 MED ORDER — METRONIDAZOLE 500 MG PO TABS: 500.0000 mg | ORAL_TABLET | Freq: Three times a day (TID) | ORAL | 0 refills | Status: DC

## 2020-09-04 MED ORDER — AMOXICILLIN-POT CLAVULANATE 875-125 MG PO TABS: 1.0000 | ORAL_TABLET | Freq: Three times a day (TID) | ORAL | 0 refills | Status: AC

## 2020-09-04 MED ORDER — CIPROFLOXACIN HCL 500 MG PO TABS: 500.0000 mg | ORAL_TABLET | Freq: Two times a day (BID) | ORAL | 0 refills | Status: DC

## 2020-09-04 NOTE — Patient Instructions (Addendum)
     Diverticulitis  Diverticulitis is when small pockets in your large intestine (colon) get infected or swollen. This causes stomach pain and watery poop (diarrhea). These pouches are called diverticula. They form in people who have a condition called diverticulosis. Follow these instructions at home: Medicines  Take over-the-counter and prescription medicines only as told by your doctor. These include: ? Antibiotics. ? Pain medicines. ? Fiber pills. ? Probiotics. ? Stool softeners.  Do not drive or use heavy machinery while taking prescription pain medicine.  If you were prescribed an antibiotic, take it as told. Do not stop taking it even if you feel better. General instructions   Follow a diet as told by your doctor.  When you feel better, your doctor may tell you to change your diet. You may need to eat a lot of fiber. Fiber makes it easier to poop (have bowel movements). Healthy foods with fiber include: ? Berries. ? Beans. ? Lentils. ? Green vegetables.  Exercise 3 or more times a week. Aim for 30 minutes each time. Exercise enough to sweat and make your heart beat faster.  Keep all follow-up visits as told. This is important. You may need to have an exam of the large intestine. This is called a colonoscopy. Contact a doctor if:  Your pain does not get better.  You have a hard time eating or drinking.  You are not pooping like normal. Get help right away if:  Your pain gets worse.  Your problems do not get better.  Your problems get worse very fast.  You have a fever.  You throw up (vomit) more than one time.  You have poop that is: ? Bloody. ? Black. ? Tarry. Summary  Diverticulitis is when small pockets in your large intestine (colon) get infected or swollen.  Take medicines only as told by your doctor.  Follow a diet as told by your doctor. This information is not intended to replace advice given to you by your health care provider. Make sure  you discuss any questions you have with your health care provider. Document Revised: 08/27/2017 Document Reviewed: 10/01/2016 Elsevier Patient Education  [employer].  If you have lab work done today you will be contacted with your lab results within the next 2 weeks.  If you have not heard from [place] then please contact us. The fastest way to get your results is to register for My Chart.   IF you received an x-ray today, you will receive an invoice from [facility]. Please contact [facility] at [phone] with questions or concerns regarding your invoice.   IF you received labwork today, you will receive an invoice from [facility]. Please contact [facility] at [phone] with questions or concerns regarding your invoice.   Our billing staff will not be able to assist you with questions regarding bills from these companies.  You will be contacted with the lab results as soon as they are available. The fastest way to get your results is to activate your My Chart account. Instructions are located on the last page of this paperwork. If you have not heard from [place] regarding the results in 2 weeks, please contact this office.

## 2020-09-04 NOTE — Progress Notes (Signed)
12/8/202111:53 AM  Katherine [name] December 19, 1955, 64 y.o., female [id number]  Chief Complaint  Patient presents with  . Abdominal Pain    x 2 days bloating, preassure, diverticulitis    HPI:   Patient is a 64 y.o. female with past medical history significant for HTN, DM, OA who presents today for abdominal pain  Abdominal pain Hx of diverticulitis Going out of town soon to [place] of triggers LLQ pain (pressure) intermittent Couldn't sleep last night Last treated 05/20/2020 Daily BM Colonoscopy last 2019 due 5 years   Depression screen [facility] 2/9 09/04/2020 06/05/2020 05/20/2020  Decreased Interest 0 0 0  Down, Depressed, Hopeless 0 0 0  PHQ - 2 Score 0 0 0    Fall Risk  09/04/2020 06/05/2020 05/20/2020 12/19/2019 12/04/2019  Falls in the past year? 0 0 0 0 0  Number falls in past yr: 0 - - - -  Injury with Fall? 0 - - - -  Risk Factor Category  - - - - -  Risk for fall due to : - - - - -  Follow up Falls evaluation completed Falls evaluation completed - - Falls evaluation completed     Allergies  Allergen Reactions  . Ace Inhibitors Cough  . Egg Yolk Other (See Comments)    vomiting  . Adhesive [Tape] Rash    Prior to Admission medications   Medication Sig Start Date End Date Taking? Authorizing Provider  amLODipine (NORVASC) 5 MG tablet Take 1 tablet by mouth once daily 08/03/20  Yes Sagardia, [name], MD  aspirin EC 81 MG tablet Take 81 mg by mouth daily.   Yes [provider]  Biotin 1000 MCG tablet Take 1,000 mcg by mouth 3 (three) times daily.   Yes [provider]  blood glucose meter kit and supplies KIT Dispense based on patient and insurance preference. Use up to four times daily as directed. (FOR ICD-9 250.00, 250.01). 06/20/18  Yes Sagardia, [name], MD  Calcium Carb-Cholecalciferol (CALCIUM 600-D PO) Take by mouth.   Yes [provider]  Cholecalciferol (VITAMIN D3 PO) Take by mouth.   Yes [provider]   glucose blood (ONE TOUCH ULTRA TEST) test strip USE  STRIP TO CHECK GLUCOSE 4 TIMES DAILY AS DIRECTED 11/28/18  Yes Sagardia, [name], MD  metFORMIN (GLUCOPHAGE) 500 MG tablet TAKE 1 TABLET BY MOUTH TWICE DAILY WITH A MEAL 09/02/20  Yes Sagardia, [name], MD  Multiple Vitamin (MULTIVITAMIN) tablet Take 1 tablet by mouth daily.   Yes [provider]  Omega-3 Fatty Acids (OMEGA-3 2100 PO) Take by mouth daily.   Yes [provider]  OVER THE COUNTER MEDICATION 1,950 mg daily.   Yes [provider]  rosuvastatin (CRESTOR) 10 MG tablet Take 1 tablet (10 mg total) by mouth daily. 12/04/19  Yes Sagardia, [name], MD  vitamin B-12 (CYANOCOBALAMIN) 1000 MCG tablet Take 1,000 mcg by mouth daily.   Yes [provider]  Lancets (ONETOUCH DELICA PLUS [id number]) MISC Inject 1 Stick into the skin 3 (three) times daily as needed. 06/06/19 09/04/19  [name], MD    Past Medical History:  Diagnosis Date  . Diabetes mellitus without complication ([place])   . Hypertension     Past Surgical History:  Procedure Laterality Date  . APPENDECTOMY    . CHOLECYSTECTOMY    . rotator cuff surgery Right 07/2014    Social History   Tobacco Use  . Smoking status: Never Smoker  .  Smokeless tobacco: Never Used  Substance Use Topics  . Alcohol use: Yes    Alcohol/week: 0.0 standard drinks    Comment: social    Family History  Problem Relation Age of Onset  . Diabetes Father   . Hypertension Father   . Sickle cell anemia Brother   . Cancer Brother 15       Leukemia, passed away at 54  . Cancer Maternal Grandmother        breast cancer  . Breast cancer Maternal Grandmother     Review of Systems  Constitutional: Negative for chills, fever, malaise/fatigue and weight loss.  Respiratory: Negative for cough and shortness of breath.   Cardiovascular: Negative for chest pain and leg swelling.  Gastrointestinal: Positive for abdominal pain (LLQ). Negative for  constipation, diarrhea, heartburn, nausea and vomiting.  Genitourinary: Negative for dysuria, flank pain, frequency, hematuria and urgency.  Neurological: Negative for headaches.     OBJECTIVE:  Today's Vitals   09/04/20 1152  BP: (!) 144/80  Pulse: 87  Resp: 16  Temp: (!) 97.3 F (36.3 C)  SpO2: 97%  Weight: 235 lb (106.6 kg)   Body mass index is 39.11 kg/m.   Physical Exam Vitals reviewed.  Constitutional:      General: She is not in acute distress.    Appearance: Normal appearance. She is obese. She is not ill-appearing.  HENT:     [name]: Normocephalic.  Cardiovascular:     Rate and Rhythm: Normal rate and regular rhythm.     Pulses: Normal pulses.     Heart sounds: Normal heart sounds. No murmur heard.  No friction rub. No gallop.   Pulmonary:     Effort: Pulmonary effort is normal. No respiratory distress.     Breath sounds: Normal breath sounds. No stridor. No wheezing, rhonchi or rales.  Abdominal:     General: Bowel sounds are normal. There is no distension.     Palpations: Abdomen is soft. There is no mass.     Tenderness: There is no abdominal tenderness. There is no right CVA tenderness, left CVA tenderness, guarding or rebound.  Musculoskeletal:     Right lower leg: No edema.     Left lower leg: No edema.  Skin:    General: Skin is warm and dry.  Neurological:     Mental Status: She is alert and oriented to person, place, and time.  Psychiatric:        Mood and Affect: Mood normal.        Behavior: Behavior normal.     No results found for this or any previous visit (from the past 24 hour(s)).  No results found.   ASSESSMENT and PLAN  Problem List Items Addressed This Visit    None    Visit Diagnoses    Acute diverticulitis       Relevant Medications   amoxicillin-clavulanate (AUGMENTIN) 875-125 MG tablet tid x 4 days R/se/b of medication discussed RTC precautions provided      Return if symptoms worsen or fail to improve, for next  scheduled appointment.   Huston Foley Heru Montz, FNP-BC Primary Care at [facility] [place], [place] [address] Ph.  [phone] Fax [phone]

## 2020-10-29 ENCOUNTER — Other Ambulatory Visit: Payer: Self-pay

## 2020-10-29 ENCOUNTER — Telehealth: Payer: Self-pay | Admitting: Emergency Medicine

## 2020-10-29 DIAGNOSIS — I152 Hypertension secondary to endocrine disorders: Secondary | ICD-10-CM

## 2020-10-29 DIAGNOSIS — I10 Essential (primary) hypertension: Secondary | ICD-10-CM

## 2020-10-29 DIAGNOSIS — E1159 Type 2 diabetes mellitus with other circulatory complications: Secondary | ICD-10-CM

## 2020-10-29 MED ORDER — ROSUVASTATIN CALCIUM 10 MG PO TABS: 10.0000 mg | ORAL_TABLET | Freq: Every day | ORAL | 3 refills | Status: DC

## 2020-10-29 MED ORDER — METFORMIN HCL 500 MG PO TABS: ORAL_TABLET | ORAL | 0 refills | Status: DC

## 2020-10-29 MED ORDER — AMLODIPINE BESYLATE 5 MG PO TABS: 5.0000 mg | ORAL_TABLET | Freq: Every day | ORAL | 0 refills | Status: DC

## 2020-10-29 NOTE — Telephone Encounter (Signed)
sent 

## 2020-10-29 NOTE — Telephone Encounter (Signed)
10/29/2020 - I RECEIVED A CALL FROM [facility] STATING THEY FAXED OVER A REFILL REQUEST FOR METFORMIN 500 mg, ROSUVASTATIN 10 mg AND AMLODIPINE 5 mg. IT WAS FAXED ON 10/23/2020. THESE ARE NEW PRESCRIPTIONS SO THEY CAN NOT BE SENT OVER ELECTRONICALLY. SHE WILL REFAX TODAY. DR, MIGUEL IS HER PROVIDER. [facility] IF QUESTIONS: [phone]   MBC

## 2020-11-04 ENCOUNTER — Other Ambulatory Visit: Payer: Self-pay | Admitting: Emergency Medicine

## 2020-11-04 DIAGNOSIS — I10 Essential (primary) hypertension: Secondary | ICD-10-CM

## 2020-11-07 ENCOUNTER — Telehealth: Payer: Self-pay | Admitting: *Deleted

## 2020-11-07 NOTE — Telephone Encounter (Signed)
Faxed Rx requests for glucose testing supplies, Metformin, Rosuvastatin and Amlodipine to [facility]. Confirmation 12:00 pm.

## 2020-11-11 ENCOUNTER — Telehealth: Payer: Self-pay | Admitting: *Deleted

## 2020-11-11 NOTE — Telephone Encounter (Signed)
On 2/102022, faxed Rx request for diabetic supplies to [facility] pharmacy Amlodipine was refilled 11/07/2020. Confirmation at 12:00 am.

## 2020-11-26 ENCOUNTER — Encounter: Payer: Self-pay | Admitting: Emergency Medicine

## 2020-11-26 ENCOUNTER — Ambulatory Visit: Payer: Medicare PPO | Admitting: Emergency Medicine

## 2020-11-26 ENCOUNTER — Other Ambulatory Visit: Payer: Self-pay

## 2020-11-26 VITALS — BP 120/78 | HR 80 | Temp 97.3°F | Resp 16 | Ht 65.0 in | Wt 244.0 lb

## 2020-11-26 DIAGNOSIS — E1169 Type 2 diabetes mellitus with other specified complication: Secondary | ICD-10-CM

## 2020-11-26 DIAGNOSIS — Z23 Encounter for immunization: Secondary | ICD-10-CM | POA: Diagnosis not present

## 2020-11-26 DIAGNOSIS — E669 Obesity, unspecified: Secondary | ICD-10-CM

## 2020-11-26 DIAGNOSIS — Z6841 Body Mass Index (BMI) 40.0 and over, adult: Secondary | ICD-10-CM

## 2020-11-26 DIAGNOSIS — E1159 Type 2 diabetes mellitus with other circulatory complications: Secondary | ICD-10-CM | POA: Diagnosis not present

## 2020-11-26 DIAGNOSIS — K5790 Diverticulosis of intestine, part unspecified, without perforation or abscess without bleeding: Secondary | ICD-10-CM | POA: Diagnosis not present

## 2020-11-26 DIAGNOSIS — I152 Hypertension secondary to endocrine disorders: Secondary | ICD-10-CM | POA: Diagnosis not present

## 2020-11-26 DIAGNOSIS — I700 Atherosclerosis of aorta: Secondary | ICD-10-CM

## 2020-11-26 LAB — GLUCOSE, POCT (MANUAL RESULT ENTRY): POC Glucose: 111 mg/dl — AB (ref 70–99)

## 2020-11-26 LAB — CMP14+EGFR
ALT: 19 IU/L (ref 0–32)
AST: 20 IU/L (ref 0–40)
Albumin/Globulin Ratio: 1.5 (ref 1.2–2.2)
Albumin: 4.6 g/dL (ref 3.8–4.8)
Alkaline Phosphatase: 101 IU/L (ref 44–121)
BUN/Creatinine Ratio: 15 (ref 12–28)
BUN: 13 mg/dL (ref 8–27)
Bilirubin Total: 0.4 mg/dL (ref 0.0–1.2)
CO2: 22 mmol/L (ref 20–29)
Calcium: 9.6 mg/dL (ref 8.7–10.3)
Chloride: 100 mmol/L (ref 96–106)
Creatinine, Ser: 0.86 mg/dL (ref 0.57–1.00)
Globulin, Total: 3.1 g/dL (ref 1.5–4.5)
Glucose: 109 mg/dL — ABNORMAL HIGH (ref 65–99)
Potassium: 4 mmol/L (ref 3.5–5.2)
Sodium: 134 mmol/L (ref 134–144)
Total Protein: 7.7 g/dL (ref 6.0–8.5)
eGFR: 74 mL/min/{1.73_m2} (ref >59)

## 2020-11-26 LAB — POCT GLYCOSYLATED HEMOGLOBIN (HGB A1C): Hemoglobin A1C: 6.4 % — AB (ref 4.0–5.6)

## 2020-11-26 NOTE — Assessment & Plan Note (Signed)
Well-controlled hypertension.  Continue amlodipine 5 mg daily. Well-controlled diabetes with hemoglobin A1c of 6.4.  Continue Metformin 500 mg twice a day. Continue rosuvastatin 10 mg daily. Diet and nutrition discussed. [name] in 3 months.

## 2020-11-26 NOTE — Patient Instructions (Addendum)
   If you have lab work done today you will be contacted with your lab results within the next 2 weeks.  If you have not heard from us then please contact us. The fastest way to get your results is to register for My Chart.   IF you received an x-ray today, you will receive an invoice from [facility]. Please contact [facility] at [phone] with questions or concerns regarding your invoice.   IF you received labwork today, you will receive an invoice from [facility]. Please contact [facility] at [phone] with questions or concerns regarding your invoice.   Our billing staff will not be able to assist you with questions regarding bills from these companies.  You will be contacted with the lab results as soon as they are available. The fastest way to get your results is to activate your My Chart account. Instructions are located on the last page of this paperwork. If you have not heard from us regarding the results in 2 weeks, please contact this office.     Diabetes Mellitus and Nutrition, Adult When you have diabetes, or diabetes mellitus, it is very important to have healthy eating habits because your blood sugar (glucose) levels are greatly affected by what you eat and drink. Eating healthy foods in the right amounts, at about the same times every day, can help you:  Control your blood glucose.  Lower your risk of heart disease.  Improve your blood pressure.  Reach or maintain a healthy weight. What can affect my meal plan? Every person with diabetes is different, and each person has different needs for a meal plan. Your health care provider may recommend that you work with a dietitian to make a meal plan that is best for you. Your meal plan may vary depending on factors such as:  The calories you need.  The medicines you take.  Your weight.  Your blood glucose, blood pressure, and cholesterol levels.  Your activity level.  Other health conditions you  have, such as heart or kidney disease. How do carbohydrates affect me? Carbohydrates, also called carbs, affect your blood glucose level more than any other type of food. Eating carbs naturally raises the amount of glucose in your blood. Carb counting is a method for keeping track of how many carbs you eat. Counting carbs is important to keep your blood glucose at a healthy level, especially if you use insulin or take certain oral diabetes medicines. It is important to know how many carbs you can safely have in each meal. This is different for every person. Your dietitian can help you calculate how many carbs you should have at each meal and for each snack. How does alcohol affect me? Alcohol can cause a sudden decrease in blood glucose (hypoglycemia), especially if you use insulin or take certain oral diabetes medicines. Hypoglycemia can be a life-threatening condition. Symptoms of hypoglycemia, such as sleepiness, dizziness, and confusion, are similar to symptoms of having too much alcohol.  Do not drink alcohol if: ? Your health care provider tells you not to drink. ? You are pregnant, may be pregnant, or are planning to become pregnant.  If you drink alcohol: ? Do not drink on an empty stomach. ? Limit how much you use to:  0-1 drink a day for women.  0-2 drinks a day for men. ? Be aware of how much alcohol is in your drink. In the [place] one drink equals one 12 oz bottle of beer (355 mL),   one 5 oz glass of wine (148 mL), or one 1 oz glass of hard liquor (44 mL). ? Keep yourself hydrated with water, diet soda, or unsweetened iced tea.  Keep in mind that regular soda, juice, and other mixers may contain a lot of sugar and must be counted as carbs. What are tips for following this plan? Reading food labels  Start by checking the serving size on the "Nutrition Facts" label of packaged foods and drinks. The amount of calories, carbs, fats, and other nutrients listed on the label is based on  one serving of the item. Many items contain more than one serving per package.  Check the total grams (g) of carbs in one serving. You can calculate the number of servings of carbs in one serving by dividing the total carbs by 15. For example, if a food has 30 g of total carbs per serving, it would be equal to 2 servings of carbs.  Check the number of grams (g) of saturated fats and trans fats in one serving. Choose foods that have a low amount or none of these fats.  Check the number of milligrams (mg) of salt (sodium) in one serving. Most people should limit total sodium intake to less than 2,300 mg per day.  Always check the nutrition information of foods labeled as "low-fat" or "nonfat." These foods may be higher in added sugar or refined carbs and should be avoided.  Talk to your dietitian to identify your daily goals for nutrients listed on the label. Shopping  Avoid buying canned, pre-made, or processed foods. These foods tend to be high in fat, sodium, and added sugar.  Shop around the outside edge of the grocery store. This is where you will most often find fresh fruits and vegetables, bulk grains, fresh meats, and fresh dairy. Cooking  Use low-heat cooking methods, such as baking, instead of high-heat cooking methods like deep frying.  Cook using healthy oils, such as olive, canola, or sunflower oil.  Avoid cooking with butter, cream, or high-fat meats. Meal planning  Eat meals and snacks regularly, preferably at the same times every day. Avoid going long periods of time without eating.  Eat foods that are high in fiber, such as fresh fruits, vegetables, beans, and whole grains. Talk with your dietitian about how many servings of carbs you can eat at each meal.  Eat 4-6 oz (112-168 g) of lean protein each day, such as lean meat, chicken, fish, eggs, or tofu. One ounce (oz) of lean protein is equal to: ? 1 oz (28 g) of meat, chicken, or fish. ? 1 egg. ?  cup (62 g) of  tofu.  Eat some foods each day that contain healthy fats, such as avocado, nuts, seeds, and fish.   What foods should I eat? Fruits Berries. Apples. Oranges. Peaches. Apricots. Plums. Grapes. Mango. Papaya. Pomegranate. Kiwi. Cherries. Vegetables Lettuce. Spinach. Leafy greens, including kale, chard, collard greens, and mustard greens. Beets. Cauliflower. Cabbage. Broccoli. Carrots. Green beans. Tomatoes. Peppers. Onions. Cucumbers. Brussels sprouts. Grains Whole grains, such as whole-wheat or whole-grain bread, crackers, tortillas, cereal, and pasta. Unsweetened oatmeal. Quinoa. Brown or wild rice. Meats and other proteins Seafood. Poultry without skin. Lean cuts of poultry and beef. Tofu. Nuts. Seeds. Dairy Low-fat or fat-free dairy products such as milk, yogurt, and cheese. The items listed above may not be a complete list of foods and beverages you can eat. Contact a dietitian for more information. What foods should I avoid? Fruits Fruits canned   with syrup. Vegetables Canned vegetables. Frozen vegetables with butter or cream sauce. Grains Refined white flour and flour products such as bread, pasta, snack foods, and cereals. Avoid all processed foods. Meats and other proteins Fatty cuts of meat. Poultry with skin. Breaded or fried meats. Processed meat. Avoid saturated fats. Dairy Full-fat yogurt, cheese, or milk. Beverages Sweetened drinks, such as soda or iced tea. The items listed above may not be a complete list of foods and beverages you should avoid. Contact a dietitian for more information. Questions to ask a health care provider  Do I need to meet with a diabetes educator?  Do I need to meet with a dietitian?  What number can I call if I have questions?  When are the best times to check my blood glucose? Where to find more information:  American Diabetes Association: diabetes.org  Academy of Nutrition and Dietetics: www.eatright.org  National Institute of  Diabetes and Digestive and Kidney Diseases: www.niddk.nih.gov  Association of Diabetes Care and Education Specialists: www.diabeteseducator.org Summary  It is important to have healthy eating habits because your blood sugar (glucose) levels are greatly affected by what you eat and drink.  A healthy meal plan will help you control your blood glucose and maintain a healthy lifestyle.  Your health care provider may recommend that you work with a dietitian to make a meal plan that is best for you.  Keep in mind that carbohydrates (carbs) and alcohol have immediate effects on your blood glucose levels. It is important to count carbs and to use alcohol carefully. This information is not intended to replace advice given to you by your health care provider. Make sure you discuss any questions you have with your health care provider. Document Revised: 08/22/2019 Document Reviewed: 08/22/2019 Elsevier Patient Education  2021 [employer]  

## 2020-11-26 NOTE — Progress Notes (Signed)
Katherine [name] 65 y.o.   Chief Complaint  Patient presents with  . Diabetes    Follow up 6 month  . Immunizations    Shingrix #2 vaccine    HISTORY OF PRESENT ILLNESS: This is a 65 y.o. female with history of diabetes and hypertension here for follow-up and also follow-up on second dose of Shingrix vaccine. Has no complaints or medical concerns. CT scan of abdomen and pelvis done last September showed diverticulosis and aortic atherosclerosis.  Findings discussed with patient.  HPI   Prior to Admission medications   Medication Sig Start Date End Date Taking? Authorizing Provider  amLODipine (NORVASC) 5 MG tablet Take 1 tablet (5 mg total) by mouth daily. 10/29/20  Yes Katherine Pollen, MD  aspirin EC 81 MG tablet Take 81 mg by mouth daily.   Yes [provider]  Biotin 1000 MCG tablet Take 1,000 mcg by mouth 3 (three) times daily.   Yes [provider]  Calcium Carb-Cholecalciferol (CALCIUM 600-D PO) Take by mouth.   Yes [provider]  Cholecalciferol (VITAMIN D3 PO) Take by mouth.   Yes [provider]  metFORMIN (GLUCOPHAGE) 500 MG tablet TAKE 1 TABLET BY MOUTH TWICE DAILY WITH A MEAL 10/29/20  Yes Katherine [name], Katherine Bloomer, MD  Multiple Vitamin (MULTIVITAMIN) tablet Take 1 tablet by mouth daily.   Yes [provider]  OVER THE COUNTER MEDICATION 1,950 mg daily.   Yes [provider]  rosuvastatin (CRESTOR) 10 MG tablet Take 1 tablet (10 mg total) by mouth daily. 10/29/20  Yes Katherine [name], Katherine Bloomer, MD  vitamin B-12 (CYANOCOBALAMIN) 1000 MCG tablet Take 1,000 mcg by mouth daily.   Yes [provider]  blood glucose meter kit and supplies KIT Dispense based on patient and insurance preference. Use up to four times daily as directed. (FOR ICD-9 250.00, 250.01). 06/20/18   Katherine Pollen, MD  glucose blood (ONE TOUCH ULTRA TEST) test strip USE  STRIP TO CHECK GLUCOSE 4 TIMES DAILY AS DIRECTED 11/28/18   Katherine [name],  Katherine Bloomer, MD  Lancets [facility] DELICA PLUS [id number]) MISC Inject 1 Stick into the skin 3 (three) times daily as needed. 06/06/19 09/04/19  Katherine Pollen, MD  Omega-3 Fatty Acids (OMEGA-3 2100 PO) Take by mouth daily. Patient not taking: Reported on 11/26/2020    [provider]    Allergies  Allergen Reactions  . Ace Inhibitors Cough  . Egg Yolk Other (See Comments)    vomiting  . Adhesive [Tape] Rash    Patient Active Problem List   Diagnosis Date Noted  . Obesity, diabetes, and hypertension syndrome ([place]) 06/05/2020  . Body mass index (BMI) of 40.1-44.9 in adult ([place]) 12/04/2019  . Class 2 severe obesity due to excess calories with serious comorbidity and body mass index (BMI) of 35.0 to 35.9 in adult [facility]) 10/19/2019  . Osteoarthritis of spine with radiculopathy, cervical region 11/30/2017  . Other cervical disc degeneration, unspecified cervical region 07/17/2017  . Arthritis 06/23/2014  . Prediabetes 10/20/2013  . Obesity (BMI 35.0-39.9 without comorbidity) 10/20/2013  . Hypertension associated with diabetes [facility])     Past Medical History:  Diagnosis Date  . Diabetes mellitus without complication ([place])   . Hypertension     Past Surgical History:  Procedure Laterality Date  . APPENDECTOMY    . CHOLECYSTECTOMY    . rotator cuff surgery Right 07/2014    Social History   Socioeconomic History  . Marital status: Married    Spouse name:  Not on file  . Number of children: 2  . Years of education: Masters   . Highest education level: Not on file  Occupational History  . Occupation: Pharmacist, hospital  Tobacco Use  . Smoking status: Never Smoker  . Smokeless tobacco: Never Used  Vaping Use  . Vaping Use: Never used  Substance and Sexual Activity  . Alcohol use: Yes    Alcohol/week: 0.0 standard drinks    Comment: social  . Drug use: No  . Sexual activity: Yes  Other Topics Concern  . Not on file  Social History Narrative   Patient has had 2  pregnancies and has 2 living children.   Social Determinants of Health   Financial Resource Strain: Not on file  Food Insecurity: Not on file  Transportation Needs: Not on file  Physical Activity: Not on file  Stress: Not on file  Social Connections: Not on file  Intimate Partner Violence: Not on file    Family History  Problem Relation Age of Onset  . Diabetes Father   . Hypertension Father   . Sickle cell anemia Brother   . Cancer Brother 15       Leukemia, passed away at 3  . Cancer Maternal Grandmother        breast cancer  . Breast cancer Maternal Grandmother      Review of Systems  Constitutional: Negative.  Negative for chills and fever.  HENT: Negative.  Negative for congestion and sore throat.   Respiratory: Negative.  Negative for cough and shortness of breath.   Cardiovascular: Negative.  Negative for chest pain and palpitations.  Gastrointestinal: Negative.  Negative for blood in stool, diarrhea, melena, nausea and vomiting.  Genitourinary: Negative.  Negative for dysuria, frequency and hematuria.  Musculoskeletal: Negative.  Negative for back pain, myalgias and neck pain.  Skin: Negative.  Negative for rash.  Neurological: Negative.  Negative for dizziness and headaches.  [name] other systems reviewed and are negative.   Today's Vitals   11/26/20 1039  BP: 120/78  Pulse: 80  Resp: 16  Temp: (!) 97.3 F (36.3 C)  TempSrc: Temporal  SpO2: 97%  Weight: 244 lb (110.7 kg)  Height: 5' 5"  (1.651 m)   Body mass index is 40.6 kg/m. Wt Readings from Last 3 Encounters:  11/26/20 244 lb (110.7 kg)  09/04/20 235 lb (106.6 kg)  06/05/20 237 lb (107.5 kg)    Physical Exam Vitals reviewed.  Constitutional:      Appearance: Normal appearance.  HENT:     [name]: Normocephalic.  Eyes:     Extraocular Movements: Extraocular movements intact.     Pupils: Pupils are equal, round, and reactive to light.  Cardiovascular:     Rate and Rhythm: Normal rate and  regular rhythm.     Pulses: Normal pulses.     Heart sounds: Normal heart sounds.  Pulmonary:     Effort: Pulmonary effort is normal.     Breath sounds: Normal breath sounds.  Musculoskeletal:        General: Normal range of motion.     Cervical back: Normal range of motion and neck supple.  Skin:    General: Skin is warm and dry.     Capillary Refill: Capillary refill takes less than 2 seconds.  Neurological:     General: No focal deficit present.     Mental Status: She is alert and oriented to person, place, and time.  Psychiatric:        Mood and  Affect: Mood normal.        Behavior: Behavior normal.     Results for orders placed or performed in visit on 11/26/20 (from the past 24 hour(s))  POCT glucose (manual entry)     Status: Abnormal   Collection Time: 11/26/20 10:49 AM  Result Value Ref Range   POC Glucose 111 (A) 70 - 99 mg/dl  POCT glycosylated hemoglobin (Hb A1C)     Status: Abnormal   Collection Time: 11/26/20 10:55 AM  Result Value Ref Range   Hemoglobin A1C 6.4 (A) 4.0 - 5.6 %   HbA1c POC (<> result, manual entry)     HbA1c, POC (prediabetic range)     HbA1c, POC (controlled diabetic range)    A total of 30 minutes was spent with the patient, greater than 50% of which was in counseling/coordination of care regarding diabetes, hypertension and obesity, and cardiovascular risks associated with these conditions, review of [name] medications, education on nutrition, review of most recent blood work results including today's hemoglobin A1c, review of most recent office visit notes, prognosis, documentation, and need for follow-up.   ASSESSMENT & PLAN: Obesity, diabetes, and hypertension syndrome ([place]) Well-controlled hypertension.  Continue amlodipine 5 mg daily. Well-controlled diabetes with hemoglobin A1c of 6.4.  Continue Metformin 500 mg twice a day. Continue rosuvastatin 10 mg daily. Diet and nutrition discussed. [name] in 3 months.  Brittnie was seen today  for diabetes and immunizations.  Diagnoses and [name] orders for this visit:  Hypertension associated with diabetes ([place]) -     CMP14+EGFR -     POCT glucose (manual entry) -     POCT glycosylated hemoglobin (Hb A1C) -     HM Diabetes Foot Exam  Need for prophylactic vaccination against Streptococcus pneumoniae (pneumococcus) -     Pneumococcal polysaccharide vaccine 23-valent greater than or equal to 2yo subcutaneous/IM  Need for shingles vaccine -     Varicella-zoster vaccine IM  Body mass index (BMI) of 40.1-44.9 in adult [facility])  Diverticulosis  Aortic atherosclerosis ([facility])  Obesity, diabetes, and hypertension syndrome ([facility])      Agustina Caroli, MD [facility]

## 2020-12-10 ENCOUNTER — Other Ambulatory Visit: Payer: Self-pay | Admitting: Emergency Medicine

## 2020-12-10 DIAGNOSIS — Z1231 Encounter for screening mammogram for malignant neoplasm of breast: Secondary | ICD-10-CM

## 2021-01-20 ENCOUNTER — Other Ambulatory Visit: Payer: Self-pay | Admitting: Emergency Medicine

## 2021-01-20 DIAGNOSIS — R7303 Prediabetes: Secondary | ICD-10-CM

## 2021-01-20 DIAGNOSIS — E1159 Type 2 diabetes mellitus with other circulatory complications: Secondary | ICD-10-CM

## 2021-02-26 ENCOUNTER — Ambulatory Visit: Payer: Medicare PPO | Admitting: Emergency Medicine

## 2021-02-26 ENCOUNTER — Other Ambulatory Visit: Payer: Self-pay

## 2021-02-26 ENCOUNTER — Encounter: Payer: Self-pay | Admitting: Emergency Medicine

## 2021-02-26 VITALS — BP 138/72 | HR 71 | Temp 98.0°F | Ht 65.0 in | Wt 244.2 lb

## 2021-02-26 DIAGNOSIS — E1159 Type 2 diabetes mellitus with other circulatory complications: Secondary | ICD-10-CM | POA: Diagnosis not present

## 2021-02-26 DIAGNOSIS — I700 Atherosclerosis of aorta: Secondary | ICD-10-CM | POA: Diagnosis not present

## 2021-02-26 DIAGNOSIS — E669 Obesity, unspecified: Secondary | ICD-10-CM

## 2021-02-26 DIAGNOSIS — I152 Hypertension secondary to endocrine disorders: Secondary | ICD-10-CM

## 2021-02-26 DIAGNOSIS — E1169 Type 2 diabetes mellitus with other specified complication: Secondary | ICD-10-CM | POA: Diagnosis not present

## 2021-02-26 LAB — POCT GLYCOSYLATED HEMOGLOBIN (HGB A1C): Hemoglobin A1C: 5.9 % — AB (ref 4.0–5.6)

## 2021-02-26 NOTE — Patient Instructions (Signed)
Health Maintenance After Age 67 After age 67, you are at a higher risk for certain long-term diseases and infections as well as injuries from falls. Falls are a major cause of broken bones and head injuries in people who are older than age 67. Getting regular preventive care can help to keep you healthy and well. Preventive care includes getting regular testing and making lifestyle changes as recommended by your health care provider. Talk with your health care provider about:  Which screenings and tests you should have. A screening is a test that checks for a disease when you have no symptoms.  A diet and exercise plan that is right for you. What should I know about screenings and tests to prevent falls? Screening and testing are the best ways to find a health problem early. Early diagnosis and treatment give you the best chance of managing medical conditions that are common after age 67. Certain conditions and lifestyle choices may make you more likely to have a fall. Your health care provider may recommend:  Regular vision checks. Poor vision and conditions such as cataracts can make you more likely to have a fall. If you wear glasses, make sure to get your prescription updated if your vision changes.  Medicine review. Work with your health care provider to regularly review all of the medicines you are taking, including over-the-counter medicines. Ask your health care provider about any side effects that may make you more likely to have a fall. Tell your health care provider if any medicines that you take make you feel dizzy or sleepy.  Osteoporosis screening. Osteoporosis is a condition that causes the bones to get weaker. This can make the bones weak and cause them to break more easily.  Blood pressure screening. Blood pressure changes and medicines to control blood pressure can make you feel dizzy.  Strength and balance checks. Your health care provider may recommend certain tests to check your  strength and balance while standing, walking, or changing positions.  Foot health exam. Foot pain and numbness, as well as not wearing proper footwear, can make you more likely to have a fall.  Depression screening. You may be more likely to have a fall if you have a fear of falling, feel emotionally low, or feel unable to do activities that you used to do.  Alcohol use screening. Using too much alcohol can affect your balance and may make you more likely to have a fall. What actions can I take to lower my risk of falls? General instructions  Talk with your health care provider about your risks for falling. Tell your health care provider if: ? You fall. Be sure to tell your health care provider about all falls, even ones that seem minor. ? You feel dizzy, sleepy, or off-balance.  Take over-the-counter and prescription medicines only as told by your health care provider. These include any supplements.  Eat a healthy diet and maintain a healthy weight. A healthy diet includes low-fat dairy products, low-fat (lean) meats, and fiber from whole grains, beans, and lots of fruits and vegetables. Home safety  Remove any tripping hazards, such as rugs, cords, and clutter.  Install safety equipment such as grab bars in bathrooms and safety rails on stairs.  Keep rooms and walkways well-lit. Activity  Follow a regular exercise program to stay fit. This will help you maintain your balance. Ask your health care provider what types of exercise are appropriate for you.  If you need a cane or walker,   use it as recommended by your health care provider.  Wear supportive shoes that have nonskid soles.   Lifestyle  Do not drink alcohol if your health care provider tells you not to drink.  If you drink alcohol, limit how much you have: ? 0-1 drink a day for women. ? 0-2 drinks a day for men.  Be aware of how much alcohol is in your drink. In the [place] one drink equals one typical bottle of beer (12  oz), one-half glass of wine (5 oz), or one shot of hard liquor (1 oz).  Do not use any products that contain nicotine or tobacco, such as cigarettes and e-cigarettes. If you need help quitting, ask your health care provider. Summary  Having a healthy lifestyle and getting preventive care can help to protect your health and wellness after age 67.  Screening and testing are the best way to find a health problem early and help you avoid having a fall. Early diagnosis and treatment give you the best chance for managing medical conditions that are more common for people who are older than age 67.  Falls are a major cause of broken bones and head injuries in people who are older than age 67. Take precautions to prevent a fall at home.  Work with your health care provider to learn what changes you can make to improve your health and wellness and to prevent falls. This information is not intended to replace advice given to you by your health care provider. Make sure you discuss any questions you have with your health care provider. Document Revised: 01/05/2019 Document Reviewed: 07/28/2017 Elsevier Patient Education  2021 [employer]  

## 2021-02-26 NOTE — Assessment & Plan Note (Signed)
Much improved.  Successful lifestyle changes. Well-controlled hypertension.  Continue amlodipine 5 mg daily. Well-controlled diabetes with hemoglobin A1c of 5.9.  Continue metformin 500 mg twice a day. Continue rosuvastatin 10 mg daily. Diet and nutrition discussed.  Continue present regimen and exercise schedule. [name] in 6 months.

## 2021-02-26 NOTE — Progress Notes (Addendum)
[name] 65 y.o.   Chief Complaint  Patient presents with  . Diabetes  . Hypertension    Follow up    Last office visit assessment and plan as follows: ASSESSMENT & PLAN: Obesity, diabetes, and hypertension syndrome ([place]) Well-controlled hypertension.  Continue amlodipine 5 mg daily. Well-controlled diabetes with hemoglobin A1c of 6.4.  Continue Metformin 500 mg twice a day. Continue rosuvastatin 10 mg daily. Diet and nutrition discussed. [name] in 3 months.  HISTORY OF PRESENT ILLNESS: This is a 65 y.o. female with history of diabetes and hypertension here for follow-up. Doing well.  Has no complaints or medical concerns today. #1 diabetes: On metformin 500 mg twice a day.  Eating better and exercising more. #2 hypertension: On amlodipine 5 mg daily #3 dyslipidemia: On rosuvastatin 10 mg daily.  HPI   Prior to Admission medications   Medication Sig Start Date End Date Taking? Authorizing Provider  amLODipine (NORVASC) 5 MG tablet Take 1 tablet (5 mg total) by mouth daily. 10/29/20  Yes [name], MD  aspirin EC 81 MG tablet Take 81 mg by mouth daily.   Yes [provider]  Biotin 1000 MCG tablet Take 1,000 mcg by mouth 3 (three) times daily.   Yes [provider]  Calcium Carb-Cholecalciferol (CALCIUM 600-D PO) Take by mouth.   Yes [provider]  Cholecalciferol (VITAMIN D3 PO) Take by mouth.   Yes [provider]  metFORMIN (GLUCOPHAGE) 500 MG tablet TAKE 1 TABLET BY MOUTH TWICE DAILY WITH A MEAL 10/29/20  Yes Johnnie Goynes, [name], MD  Multiple Vitamin (MULTIVITAMIN) tablet Take 1 tablet by mouth daily.   Yes [provider]  Omega-3 Fatty Acids (OMEGA-3 2100 PO) Take by mouth daily.   Yes [provider]  OVER THE COUNTER MEDICATION 1,950 mg daily.   Yes [provider]  rosuvastatin (CRESTOR) 10 MG tablet Take 1 tablet (10 mg total) by mouth daily. 10/29/20  Yes Dahlton Hinde, [name], MD   vitamin B-12 (CYANOCOBALAMIN) 1000 MCG tablet Take 1,000 mcg by mouth daily.   Yes [provider]  ACCU-CHEK GUIDE test strip TEST BLOOD GLUCOSE FOUR TIMES DAILY AS DIRECTED 01/20/21   [name], MD  Accu-Chek Softclix Lancets lancets TEST BLOOD GLUCOSE FOUR TIMES DAILY AS DIRECTED Patient not taking: Reported on 02/26/2021 01/20/21   [name], MD  Alcohol Swabs (B-D SINGLE USE SWABS REGULAR) PADS USE  TO TEST BLOOD GLUCOSE FOUR TIMES DAILY AS DIRECTED 01/20/21   [name], MD  blood glucose meter kit and supplies KIT Dispense based on patient and insurance preference. Use up to four times daily as directed. (FOR ICD-9 250.00, 250.01). 06/20/18   [name], MD    Allergies  Allergen Reactions  . Ace Inhibitors Cough  . Egg Yolk Other (See Comments)    vomiting  . Adhesive [Tape] Rash    Patient Active Problem List   Diagnosis Date Noted  . Aortic atherosclerosis ([place]) 11/26/2020  . Diverticulosis 11/26/2020  . Obesity, diabetes, and hypertension syndrome ([place]) 06/05/2020  . Body mass index (BMI) of 40.1-44.9 in adult ([place]) 12/04/2019  . Class 2 severe obesity due to excess calories with serious comorbidity and body mass index (BMI) of 35.0 to 35.9 in adult [facility]) 10/19/2019  . Osteoarthritis of spine with radiculopathy, cervical region 11/30/2017  . Other cervical disc degeneration, unspecified cervical region 07/17/2017  . Arthritis 06/23/2014  . Prediabetes 10/20/2013  . Obesity (BMI 35.0-39.9 without comorbidity) 10/20/2013  .  Hypertension associated with diabetes [facility])     Past Medical History:  Diagnosis Date  . Diabetes mellitus without complication ([place])   . Hypertension     Past Surgical History:  Procedure Laterality Date  . APPENDECTOMY    . CHOLECYSTECTOMY    . rotator cuff surgery Right 07/2014    Social History   Socioeconomic History  . Marital status: Married    Spouse name: Not on file  . Number of  children: 2  . Years of education: Masters   . Highest education level: Not on file  Occupational History  . Occupation: Pharmacist, hospital  Tobacco Use  . Smoking status: Never Smoker  . Smokeless tobacco: Never Used  Vaping Use  . Vaping Use: Never used  Substance and Sexual Activity  . Alcohol use: Yes    Alcohol/week: 0.0 standard drinks    Comment: social  . Drug use: No  . Sexual activity: Yes  Other Topics Concern  . Not on file  Social History Narrative   Patient has had 2 pregnancies and has 2 living children.   Social Determinants of Health   Financial Resource Strain: Not on file  Food Insecurity: Not on file  Transportation Needs: Not on file  Physical Activity: Not on file  Stress: Not on file  Social Connections: Not on file  Intimate Partner Violence: Not on file    Family History  Problem Relation Age of Onset  . Diabetes Father   . Hypertension Father   . Sickle cell anemia Brother   . Cancer Brother 15       Leukemia, passed away at 4  . Cancer Maternal Grandmother        breast cancer  . Breast cancer Maternal Grandmother      Review of Systems  Constitutional: Negative.  Negative for chills and fever.  HENT: Negative.  Negative for congestion and sore throat.   Respiratory: Negative.  Negative for cough and shortness of breath.   Cardiovascular: Negative.  Negative for chest pain and palpitations.  Gastrointestinal: Negative for abdominal pain, diarrhea, nausea and vomiting.  Genitourinary: Negative.  Negative for dysuria and hematuria.  Skin: Negative.  Negative for rash.  Neurological: Negative.  Negative for dizziness and headaches.  All other systems reviewed and are negative.   Today's Vitals   02/26/21 1005  BP: 138/72  Pulse: 71  Temp: 98 F (36.7 C)  TempSrc: Oral  SpO2: 98%  Weight: 244 lb 3.2 oz (110.8 kg)  Height: 5' 5"  (1.651 m)   Body mass index is 40.64 kg/m. Wt Readings from Last 3 Encounters:  02/26/21 244 lb 3.2 oz  (110.8 kg)  11/26/20 244 lb (110.7 kg)  09/04/20 235 lb (106.6 kg)    Physical Exam Vitals reviewed.  Constitutional:      Appearance: Normal appearance.  HENT:     Head: Normocephalic.  Eyes:     Extraocular Movements: Extraocular movements intact.     Pupils: Pupils are equal, round, and reactive to light.  Cardiovascular:     Rate and Rhythm: Normal rate and regular rhythm.     Pulses: Normal pulses.     Heart sounds: Normal heart sounds.  Pulmonary:     Effort: Pulmonary effort is normal.     Breath sounds: Normal breath sounds.  Musculoskeletal:        General: Normal range of motion.     Cervical back: Normal range of motion and neck supple.  Skin:    General:  Skin is warm and dry.     Capillary Refill: Capillary refill takes less than 2 seconds.  Neurological:     General: No focal deficit present.     Mental Status: She is alert and oriented to person, place, and time.  Psychiatric:        Mood and Affect: Mood normal.        Behavior: Behavior normal.    Results for orders placed or performed in visit on 02/26/21 (from the past 24 hour(s))  POCT glycosylated hemoglobin (Hb A1C)     Status: Abnormal   Collection Time: 02/26/21 10:36 AM  Result Value Ref Range   Hemoglobin A1C 5.9 (A) 4.0 - 5.6 %   HbA1c POC (<> result, manual entry)     HbA1c, POC (prediabetic range)     HbA1c, POC (controlled diabetic range)       ASSESSMENT & PLAN: A total of 30 minutes was spent with the patient and counseling/coordination of care regarding diabetes and hypertension and cardiovascular risks associated with these conditions, education on nutrition, review of most recent blood work results including today's hemoglobin A1c, review of all medications, review of most recent office visit notes, health maintenance items, prognosis, documentation and need for follow-up.  Obesity, diabetes, and hypertension syndrome ([place]) Much improved.  Successful lifestyle  changes. Well-controlled hypertension.  Continue amlodipine 5 mg daily. Well-controlled diabetes with hemoglobin A1c of 5.9.  Continue metformin 500 mg twice a day. Continue rosuvastatin 10 mg daily. Diet and nutrition discussed.  Continue present regimen and exercise schedule. [name] in 6 months.   [place] was seen today for diabetes and hypertension.  Diagnoses and all orders for this visit:  Obesity, diabetes, and hypertension syndrome ([facility]) -     POCT glycosylated hemoglobin (Hb A1C)  Aortic atherosclerosis [facility])    Patient Instructions   Health Maintenance After Age 72 After age 46, you are at a higher risk for certain long-term diseases and infections as well as injuries from falls. Falls are a major cause of broken bones and head injuries in people who are older than age 77. Getting regular preventive care can help to keep you healthy and well. Preventive care includes getting regular testing and making lifestyle changes as recommended by your health care provider. Talk with your health care provider about:  Which screenings and tests you should have. A screening is a test that checks for a disease when you have no symptoms.  A diet and exercise plan that is right for you. What should I know about screenings and tests to prevent falls? Screening and testing are the best ways to find a health problem early. Early diagnosis and treatment give you the best chance of managing medical conditions that are common after age 49. Certain conditions and lifestyle choices may make you more likely to have a fall. Your health care provider may recommend:  Regular vision checks. Poor vision and conditions such as cataracts can make you more likely to have a fall. If you wear glasses, make sure to get your prescription updated if your vision changes.  Medicine review. Work with your health care provider to regularly review all of the medicines you are taking, including over-the-counter  medicines. Ask your health care provider about any side effects that may make you more likely to have a fall. Tell your health care provider if any medicines that you take make you feel dizzy or sleepy.  Osteoporosis screening. Osteoporosis is a condition that causes the bones  to get weaker. This can make the bones weak and cause them to break more easily.  Blood pressure screening. Blood pressure changes and medicines to control blood pressure can make you feel dizzy.  Strength and balance checks. Your health care provider may recommend certain tests to check your strength and balance while standing, walking, or changing positions.  Foot health exam. Foot pain and numbness, as well as not wearing proper footwear, can make you more likely to have a fall.  Depression screening. You may be more likely to have a fall if you have a fear of falling, feel emotionally low, or feel unable to do activities that you used to do.  Alcohol use screening. Using too much alcohol can affect your balance and may make you more likely to have a fall. What actions can I take to lower my risk of falls? General instructions  Talk with your health care provider about your risks for falling. Tell your health care provider if: ? You fall. Be sure to tell your health care provider about all falls, even ones that seem minor. ? You feel dizzy, sleepy, or off-balance.  Take over-the-counter and prescription medicines only as told by your health care provider. These include any supplements.  Eat a healthy diet and maintain a healthy weight. A healthy diet includes low-fat dairy products, low-fat (lean) meats, and fiber from whole grains, beans, and lots of fruits and vegetables. Home safety  Remove any tripping hazards, such as rugs, cords, and clutter.  Install safety equipment such as grab bars in bathrooms and safety rails on stairs.  Keep rooms and walkways well-lit. Activity  Follow a regular exercise program  to stay fit. This will help you maintain your balance. Ask your health care provider what types of exercise are appropriate for you.  If you need a cane or walker, use it as recommended by your health care provider.  Wear supportive shoes that have nonskid soles.   Lifestyle  Do not drink alcohol if your health care provider tells you not to drink.  If you drink alcohol, limit how much you have: ? 0-1 drink a day for women. ? 0-2 drinks a day for men.  Be aware of how much alcohol is in your drink. In the [place] one drink equals one typical bottle of beer (12 oz), one-half glass of wine (5 oz), or one shot of hard liquor (1 oz).  Do not use any products that contain nicotine or tobacco, such as cigarettes and e-cigarettes. If you need help quitting, ask your health care provider. Summary  Having a healthy lifestyle and getting preventive care can help to protect your health and wellness after age 43.  Screening and testing are the best way to find a health problem early and help you avoid having a fall. Early diagnosis and treatment give you the best chance for managing medical conditions that are more common for people who are older than age 43.  Falls are a major cause of broken bones and head injuries in people who are older than age 26. Take precautions to prevent a fall at home.  Work with your health care provider to learn what changes you can make to improve your health and wellness and to prevent falls. This information is not intended to replace advice given to you by your health care provider. Make sure you discuss any questions you have with your health care provider. Document Revised: 01/05/2019 Document Reviewed: 07/28/2017 Elsevier Patient Education  2021  Elsevier Inc.     Agustina Caroli, MD [facility]

## 2021-04-01 ENCOUNTER — Ambulatory Visit
Admission: RE | Admit: 2021-04-01 | Discharge: 2021-04-01 | Disposition: A | Discharge status: Home / Self Care | Payer: Medicare PPO | Source: Ambulatory Visit | Attending: Emergency Medicine | Admitting: Emergency Medicine

## 2021-04-01 ENCOUNTER — Other Ambulatory Visit: Payer: Self-pay

## 2021-04-01 DIAGNOSIS — Z1231 Encounter for screening mammogram for malignant neoplasm of breast: Secondary | ICD-10-CM

## 2021-06-17 LAB — HM DIABETES EYE EXAM

## 2021-06-23 ENCOUNTER — Other Ambulatory Visit: Payer: Self-pay

## 2021-06-24 ENCOUNTER — Encounter: Payer: Self-pay | Admitting: Emergency Medicine

## 2021-06-24 ENCOUNTER — Ambulatory Visit (INDEPENDENT_AMBULATORY_CARE_PROVIDER_SITE_OTHER): Payer: Medicare PPO | Admitting: Emergency Medicine

## 2021-06-24 VITALS — BP 132/78 | HR 72 | Temp 98.2°F | Ht 65.0 in | Wt 248.0 lb

## 2021-06-24 DIAGNOSIS — I152 Hypertension secondary to endocrine disorders: Secondary | ICD-10-CM | POA: Diagnosis not present

## 2021-06-24 DIAGNOSIS — Z23 Encounter for immunization: Secondary | ICD-10-CM

## 2021-06-24 DIAGNOSIS — Z0001 Encounter for general adult medical examination with abnormal findings: Secondary | ICD-10-CM | POA: Diagnosis not present

## 2021-06-24 DIAGNOSIS — E669 Obesity, unspecified: Secondary | ICD-10-CM

## 2021-06-24 DIAGNOSIS — E1169 Type 2 diabetes mellitus with other specified complication: Secondary | ICD-10-CM | POA: Diagnosis not present

## 2021-06-24 DIAGNOSIS — Z13820 Encounter for screening for osteoporosis: Secondary | ICD-10-CM

## 2021-06-24 DIAGNOSIS — I700 Atherosclerosis of aorta: Secondary | ICD-10-CM

## 2021-06-24 DIAGNOSIS — Z13220 Encounter for screening for lipoid disorders: Secondary | ICD-10-CM | POA: Diagnosis not present

## 2021-06-24 DIAGNOSIS — E1159 Type 2 diabetes mellitus with other circulatory complications: Secondary | ICD-10-CM

## 2021-06-24 DIAGNOSIS — I10 Essential (primary) hypertension: Secondary | ICD-10-CM

## 2021-06-24 DIAGNOSIS — Z13228 Encounter for screening for other metabolic disorders: Secondary | ICD-10-CM

## 2021-06-24 DIAGNOSIS — Z1329 Encounter for screening for other suspected endocrine disorder: Secondary | ICD-10-CM

## 2021-06-24 DIAGNOSIS — Z130 Encounter for screening for diseases of the blood and blood-forming organs and certain disorders involving the immune mechanism: Secondary | ICD-10-CM

## 2021-06-24 LAB — HEMOGLOBIN A1C: Hgb A1c MFr Bld: 6.3 % (ref 4.6–6.5)

## 2021-06-24 LAB — CBC WITH DIFFERENTIAL/PLATELET
Basophils Absolute: 0 10*3/uL (ref 0.0–0.1)
Basophils Relative: 0.4 % (ref 0.0–3.0)
Eosinophils Absolute: 0.1 10*3/uL (ref 0.0–0.7)
Eosinophils Relative: 2.2 % (ref 0.0–5.0)
HCT: 40.5 % (ref 36.0–46.0)
Hemoglobin: 13.7 g/dL (ref 12.0–15.0)
Lymphocytes Relative: 50.2 % — ABNORMAL HIGH (ref 12.0–46.0)
Lymphs Abs: 2.6 10*3/uL (ref 0.7–4.0)
MCHC: 33.7 g/dL (ref 30.0–36.0)
MCV: 82.2 fl (ref 78.0–100.0)
Monocytes Absolute: 0.4 10*3/uL (ref 0.1–1.0)
Monocytes Relative: 7 % (ref 3.0–12.0)
Neutro Abs: 2.1 10*3/uL (ref 1.4–7.7)
Neutrophils Relative %: 40.2 % — ABNORMAL LOW (ref 43.0–77.0)
Platelets: 228 10*3/uL (ref 150.0–400.0)
RBC: 4.93 Mil/uL (ref 3.87–5.11)
RDW: 15.3 % (ref 11.5–15.5)
WBC: 5.2 10*3/uL (ref 4.0–10.5)

## 2021-06-24 LAB — COMPREHENSIVE METABOLIC PANEL WITH GFR
ALT: 18 U/L (ref 0–35)
AST: 21 U/L (ref 0–37)
Albumin: 4.3 g/dL (ref 3.5–5.2)
Alkaline Phosphatase: 80 U/L (ref 39–117)
BUN: 12 mg/dL (ref 6–23)
CO2: 31 meq/L (ref 19–32)
Calcium: 9.6 mg/dL (ref 8.4–10.5)
Chloride: 102 meq/L (ref 96–112)
Creatinine, Ser: 0.83 mg/dL (ref 0.40–1.20)
GFR: 73.06 mL/min
Glucose, Bld: 105 mg/dL — ABNORMAL HIGH (ref 70–99)
Potassium: 3.9 meq/L (ref 3.5–5.1)
Sodium: 139 meq/L (ref 135–145)
Total Bilirubin: 0.7 mg/dL (ref 0.2–1.2)
Total Protein: 7.8 g/dL (ref 6.0–8.3)

## 2021-06-24 LAB — LIPID PANEL
Cholesterol: 103 mg/dL (ref 0–200)
HDL: 49.8 mg/dL (ref >39.00)
LDL Cholesterol: 38 mg/dL (ref 0–99)
NonHDL: 53.52
Total CHOL/HDL Ratio: 2
Triglycerides: 76 mg/dL (ref 0.0–149.0)
VLDL: 15.2 mg/dL (ref 0.0–40.0)

## 2021-06-24 LAB — TSH: TSH: 2.58 u[IU]/mL (ref 0.35–5.50)

## 2021-06-24 MED ORDER — TRULICITY 0.75 MG/0.5ML ~~LOC~~ SOAJ: 0.7500 mg | SUBCUTANEOUS | 3 refills | Status: DC

## 2021-06-24 NOTE — Patient Instructions (Signed)
Health Maintenance, Female Adopting a healthy lifestyle and getting preventive care are important in promoting health and wellness. Ask your health care provider about: The right schedule for you to have regular tests and exams. Things you can do on your own to prevent diseases and keep yourself healthy. What should I know about diet, weight, and exercise? Eat a healthy diet  Eat a diet that includes plenty of vegetables, fruits, low-fat dairy products, and lean protein. Do not eat a lot of foods that are high in solid fats, added sugars, or sodium. Maintain a healthy weight Body mass index (BMI) is used to identify weight problems. It estimates body fat based on height and weight. Your health care provider can help determine your BMI and help you achieve or maintain a healthy weight. Get regular exercise Get regular exercise. This is one of the most important things you can do for your health. Most adults should: Exercise for at least 150 minutes each week. The exercise should increase your heart rate and make you sweat (moderate-intensity exercise). Do strengthening exercises at least twice a week. This is in addition to the moderate-intensity exercise. Spend less time sitting. Even light physical activity can be beneficial. Watch cholesterol and blood lipids Have your blood tested for lipids and cholesterol at 67 years of age, then have this test every 5 years. Have your cholesterol levels checked more often if: Your lipid or cholesterol levels are high. You are older than 67 years of age. You are at high risk for heart disease. What should I know about cancer screening? Depending on your health history and family history, you may need to have cancer screening at various ages. This may include screening for: Breast cancer. Cervical cancer. Colorectal cancer. Skin cancer. Lung cancer. What should I know about heart disease, diabetes, and high blood pressure? Blood pressure and heart  disease High blood pressure causes heart disease and increases the risk of stroke. This is more likely to develop in people who have high blood pressure readings, are of African descent, or are overweight. Have your blood pressure checked: Every 3-5 years if you are 18-39 years of age. Every year if you are 40 years old or older. Diabetes Have regular diabetes screenings. This checks your fasting blood sugar level. Have the screening done: Once every three years after age 40 if you are at a normal weight and have a low risk for diabetes. More often and at a younger age if you are overweight or have a high risk for diabetes. What should I know about preventing infection? Hepatitis B If you have a higher risk for hepatitis B, you should be screened for this virus. Talk with your health care provider to find out if you are at risk for hepatitis B infection. Hepatitis C Testing is recommended for: Everyone born from 1945 through 1965. Anyone with known risk factors for hepatitis C. Sexually transmitted infections (STIs) Get screened for STIs, including gonorrhea and chlamydia, if: You are sexually active and are younger than 67 years of age. You are older than 67 years of age and your health care provider tells you that you are at risk for this type of infection. Your sexual activity has changed since you were last screened, and you are at increased risk for chlamydia or gonorrhea. Ask your health care provider if you are at risk. Ask your health care provider about whether you are at high risk for HIV. Your health care provider may recommend a prescription medicine   to help prevent HIV infection. If you choose to take medicine to prevent HIV, you should first get tested for HIV. You should then be tested every 3 months for as long as you are taking the medicine. Pregnancy If you are about to stop having your period (premenopausal) and you may become pregnant, seek counseling before you get  pregnant. Take 400 to 800 micrograms (mcg) of folic acid every day if you become pregnant. Ask for birth control (contraception) if you want to prevent pregnancy. Osteoporosis and menopause Osteoporosis is a disease in which the bones lose minerals and strength with aging. This can result in bone fractures. If you are 65 years old or older, or if you are at risk for osteoporosis and fractures, ask your health care provider if you should: Be screened for bone loss. Take a calcium or vitamin D supplement to lower your risk of fractures. Be given hormone replacement therapy (HRT) to treat symptoms of menopause. Follow these instructions at home: Lifestyle Do not use any products that contain nicotine or tobacco, such as cigarettes, e-cigarettes, and chewing tobacco. If you need help quitting, ask your health care provider. Do not use street drugs. Do not share needles. Ask your health care provider for help if you need support or information about quitting drugs. Alcohol use Do not drink alcohol if: Your health care provider tells you not to drink. You are pregnant, may be pregnant, or are planning to become pregnant. If you drink alcohol: Limit how much you use to 0-1 drink a day. Limit intake if you are breastfeeding. Be aware of how much alcohol is in your drink. In the [place] one drink equals one 12 oz bottle of beer (355 mL), one 5 oz glass of wine (148 mL), or one 1 oz glass of hard liquor (44 mL). General instructions Schedule regular health, dental, and eye exams. Stay current with your vaccines. Tell your health care provider if: You often feel depressed. You have ever been abused or do not feel safe at home. Summary Adopting a healthy lifestyle and getting preventive care are important in promoting health and wellness. Follow your health care provider's instructions about healthy diet, exercising, and getting tested or screened for diseases. Follow your health care provider's  instructions on monitoring your cholesterol and blood pressure. This information is not intended to replace advice given to you by your health care provider. Make sure you discuss any questions you have with your health care provider. Document Revised: 11/22/2020 Document Reviewed: 09/07/2018 Elsevier Patient Education  2022 Elsevier Inc.  

## 2021-06-24 NOTE — Progress Notes (Signed)
Katherine [name] 65 y.o.   Chief Complaint  Patient presents with   Annual Exam    HISTORY OF PRESENT ILLNESS: This is a 65 y.o. female here for her annual exam. Has a history of hypertension, diabetes, obesity. Medication list reviewed with patient. Concerned about her weight. Health maintenance items reviewed with patient.  Up-to-date. No other concerns or medical complaints today.  HPI   Prior to Admission medications   Medication Sig Start Date End Date Taking? Authorizing Provider  ACCU-CHEK GUIDE test strip TEST BLOOD GLUCOSE FOUR TIMES DAILY AS DIRECTED 01/20/21  Yes Trenice Mesa, [name], MD  Alcohol Swabs (B-D SINGLE USE SWABS REGULAR) PADS USE  TO TEST BLOOD GLUCOSE FOUR TIMES DAILY AS DIRECTED 01/20/21  Yes Jeslyn Amsler, [name], MD  amLODipine (NORVASC) 5 MG tablet Take 1 tablet (5 mg total) by mouth daily. 10/29/20  Yes [name], MD  aspirin EC 81 MG tablet Take 81 mg by mouth daily.   Yes [provider]  Biotin 1000 MCG tablet Take 1,000 mcg by mouth 3 (three) times daily.   Yes [provider]  blood glucose meter kit and supplies KIT Dispense based on patient and insurance preference. Use up to four times daily as directed. (FOR ICD-9 250.00, 250.01). 06/20/18  Yes Aubrey Antolin, [name], MD  Calcium Carb-Cholecalciferol (CALCIUM 600-D PO) Take by mouth.   Yes [provider]  Cholecalciferol (VITAMIN D3 PO) Take by mouth.   Yes [provider]  metFORMIN (GLUCOPHAGE) 500 MG tablet TAKE 1 TABLET BY MOUTH TWICE DAILY WITH A MEAL 10/29/20  Yes Lonette Stevison, [name], MD  Multiple Vitamin (MULTIVITAMIN) tablet Take 1 tablet by mouth daily.   Yes [provider]  Omega-3 Fatty Acids (OMEGA-3 2100 PO) Take by mouth daily.   Yes [provider]  OVER THE COUNTER MEDICATION 1,950 mg daily.   Yes [provider]  rosuvastatin (CRESTOR) 10 MG tablet Take 1 tablet (10 mg total) by mouth daily. 10/29/20  Yes  Marializ Ferrebee, [name], MD  vitamin B-12 (CYANOCOBALAMIN) 1000 MCG tablet Take 1,000 mcg by mouth daily.   Yes [provider]  Accu-Chek Softclix Lancets lancets TEST BLOOD GLUCOSE FOUR TIMES DAILY AS DIRECTED Patient not taking: No sig reported 01/20/21   [name], MD    Allergies  Allergen Reactions   Ace Inhibitors Cough   Egg Yolk Other (See Comments)    vomiting   Adhesive [Tape] Rash    Patient Active Problem List   Diagnosis Date Noted   Aortic atherosclerosis ([place]) 11/26/2020   Diverticulosis 11/26/2020   Obesity, diabetes, and hypertension syndrome ([place]) 06/05/2020   Body mass index (BMI) of 40.1-44.9 in adult ([place]) 12/04/2019   Class 2 severe obesity due to excess calories with serious comorbidity and body mass index (BMI) of 35.0 to 35.9 in adult [facility]) 10/19/2019   Osteoarthritis of spine with radiculopathy, cervical region 11/30/2017   Arthritis 06/23/2014   Prediabetes 10/20/2013   Obesity (BMI 35.0-39.9 without comorbidity) 10/20/2013   Hypertension associated with diabetes [facility])     Past Medical History:  Diagnosis Date   Diabetes mellitus without complication ([place])    Hypertension     Past Surgical History:  Procedure Laterality Date   APPENDECTOMY     CHOLECYSTECTOMY     rotator cuff surgery Right 07/2014    Social History   Socioeconomic History   Marital status: Married    Spouse name: Not on file   Number of children:  2   Years of education: Masters    Highest education level: Not on file  Occupational History   Occupation: Teacher  Tobacco Use   Smoking status: Never   Smokeless tobacco: Never  Vaping Use   Vaping Use: Never used  Substance and Sexual Activity   Alcohol use: Yes    Alcohol/week: 0.0 standard drinks    Comment: social   Drug use: No   Sexual activity: Yes  Other Topics Concern   Not on file  Social History Narrative   Patient has had 2 pregnancies and has 2 living children.   Social  Determinants of Health   Financial Resource Strain: Not on file  Food Insecurity: Not on file  Transportation Needs: Not on file  Physical Activity: Not on file  Stress: Not on file  Social Connections: Not on file  Intimate Partner Violence: Not on file    Family History  Problem Relation Age of Onset   Diabetes Father    Hypertension Father    Breast cancer Maternal Aunt        58s   Cancer Maternal Grandmother        breast cancer   Breast cancer Maternal Grandmother    Sickle cell anemia Brother    Cancer Brother 15       Leukemia, passed away at 55     Review of Systems  Constitutional: Negative.  Negative for chills and fever.  HENT: Negative.  Negative for congestion and sore throat.   Respiratory: Negative.  Negative for cough and shortness of breath.   Cardiovascular:  Negative for chest pain and palpitations.  Gastrointestinal:  Negative for abdominal pain, diarrhea, nausea and vomiting.  Genitourinary: Negative.   Skin: Negative.  Negative for rash.  Neurological: Negative.  Negative for dizziness and headaches.  All other systems reviewed and are negative.  Today's Vitals   06/24/21 0901  BP: 132/78  Pulse: 72  Temp: 98.2 F (36.8 C)  TempSrc: Oral  SpO2: 98%  Weight: 248 lb (112.5 kg)  Height: _0  (1.651 m)   Body mass index is 41.27 kg/m. Wt Readings from Last 3 Encounters:  06/24/21 248 lb (112.5 kg)  02/26/21 244 lb 3.2 oz (110.8 kg)  11/26/20 244 lb (110.7 kg)    Physical Exam Vitals reviewed.  Constitutional:      Appearance: Normal appearance. She is obese.  HENT:     [name]: Normocephalic.     Right Ear: Tympanic membrane, ear canal and external ear normal.     Left Ear: Tympanic membrane, ear canal and external ear normal.  Eyes:     Extraocular Movements: Extraocular movements intact.     Conjunctiva/sclera: Conjunctivae normal.     Pupils: Pupils are equal, round, and reactive to light.  Cardiovascular:     Rate and Rhythm:  Normal rate and regular rhythm.     Pulses: Normal pulses.     Heart sounds: Normal heart sounds.  Pulmonary:     Effort: Pulmonary effort is normal.     Breath sounds: Normal breath sounds.  Abdominal:     General: There is no distension.     Palpations: Abdomen is soft.     Tenderness: There is no abdominal tenderness.  Musculoskeletal:        General: Normal range of motion.     Cervical back: Normal range of motion and neck supple.  Skin:    General: Skin is warm and dry.     Capillary  Refill: Capillary refill takes less than 2 seconds.  Neurological:     General: No focal deficit present.     Mental Status: She is alert and oriented to person, place, and time.  Psychiatric:        Mood and Affect: Mood normal.     ASSESSMENT & PLAN: Katherine [name] was seen today for annual exam.  Diagnoses and all orders for this visit:  Encounter for general adult medical examination with abnormal findings  Need for influenza vaccination -     Flu Vaccine QUAD 5moIM (Fluarix, Fluzone & Alfiuria Quad PF)  Need for Tdap vaccination -     Tdap vaccine greater than or equal to 7yo IM  Obesity, diabetes, and hypertension syndrome (HCC) -     CBC with Differential -     Comprehensive metabolic panel -     Hemoglobin A1c -     Lipid panel -     TSH -     Microalbumin, urine -     Dulaglutide (TRULICITY) 02.29MNL/8.9QJSOPN; Inject 0.75 mg into the skin once a week.  Aortic atherosclerosis (HCC)  Screening for deficiency anemia  Screening for lipoid disorders -     CBC with Differential  Screening for endocrine, metabolic and immunity disorder -     TSH  Osteoporosis screening -     HM DEXA SCAN   Modifiable risk factors discussed with patient. Anticipatory guidance according to age provided. The following topics were also discussed: Social Determinants of Health Smoking.  Non-smoker Diet and nutrition and need to decrease amount of daily carbohydrate intake May benefit from  starting weekly Trulicity 01.94mg.  Demonstration pen used.  Will help with diabetes and weight reduction. Benefits of exercise Cancer screening and review of most recent colonoscopy and mammograms Vaccinations recommendations Cardiovascular risk assessment Mental health including depression and anxiety Fall and accident prevention  Patient Instructions  Health Maintenance, Female Adopting a healthy lifestyle and getting preventive care are important in promoting health and wellness. Ask your health care provider about: The right schedule for you to have regular tests and exams. Things you can do on your own to prevent diseases and keep yourself healthy. What should I know about diet, weight, and exercise? Eat a healthy diet  Eat a diet that includes plenty of vegetables, fruits, low-fat dairy products, and lean protein. Do not eat a lot of foods that are high in solid fats, added sugars, or sodium. Maintain a healthy weight Body mass index (BMI) is used to identify weight problems. It estimates body fat based on height and weight. Your health care provider can help determine your BMI and help you achieve or maintain a healthy weight. Get regular exercise Get regular exercise. This is one of the most important things you can do for your health. Most adults should: Exercise for at least 150 minutes each week. The exercise should increase your heart rate and make you sweat (moderate-intensity exercise). Do strengthening exercises at least twice a week. This is in addition to the moderate-intensity exercise. Spend less time sitting. Even light physical activity can be beneficial. Watch cholesterol and blood lipids Have your blood tested for lipids and cholesterol at 67years of age, then have this test every 5 years. Have your cholesterol levels checked more often if: Your lipid or cholesterol levels are high. You are older than 67years of age. You are at high risk for heart  disease. What should I know about cancer screening? Depending  on your health history and family history, you may need to have cancer screening at various ages. This may include screening for: Breast cancer. Cervical cancer. Colorectal cancer. Skin cancer. Lung cancer. What should I know about heart disease, diabetes, and high blood pressure? Blood pressure and heart disease High blood pressure causes heart disease and increases the risk of stroke. This is more likely to develop in people who have high blood pressure readings, are of African descent, or are overweight. Have your blood pressure checked: Every 3-5 years if you are 53-60 years of age. Every year if you are 37 years old or older. Diabetes Have regular diabetes screenings. This checks your fasting blood sugar level. Have the screening done: Once every three years after age 86 if you are at a normal weight and have a low risk for diabetes. More often and at a younger age if you are overweight or have a high risk for diabetes. What should I know about preventing infection? Hepatitis B If you have a higher risk for hepatitis B, you should be screened for this virus. Talk with your health care provider to find out if you are at risk for hepatitis B infection. Hepatitis C Testing is recommended for: Everyone born from [birth year] through 1967. Anyone with known risk factors for hepatitis C. Sexually transmitted infections (STIs) Get screened for STIs, including gonorrhea and chlamydia, if: You are sexually active and are younger than 67 years of age. You are older than 67 years of age and your health care provider tells you that you are at risk for this type of infection. Your sexual activity has changed since you were last screened, and you are at increased risk for chlamydia or gonorrhea. Ask your health care provider if you are at risk. Ask your health care provider about whether you are at high risk for HIV. Your health care provider  may recommend a prescription medicine to help prevent HIV infection. If you choose to take medicine to prevent HIV, you should first get tested for HIV. You should then be tested every 3 months for as long as you are taking the medicine. Pregnancy If you are about to stop having your period (premenopausal) and you may become pregnant, seek counseling before you get pregnant. Take 400 to 800 micrograms (mcg) of folic acid every day if you become pregnant. Ask for birth control (contraception) if you want to prevent pregnancy. Osteoporosis and menopause Osteoporosis is a disease in which the bones lose minerals and strength with aging. This can result in bone fractures. If you are 15 years old or older, or if you are at risk for osteoporosis and fractures, ask your health care provider if you should: Be screened for bone loss. Take a calcium or vitamin D supplement to lower your risk of fractures. Be given hormone replacement therapy (HRT) to treat symptoms of menopause. Follow these instructions at home: Lifestyle Do not use any products that contain nicotine or tobacco, such as cigarettes, e-cigarettes, and chewing tobacco. If you need help quitting, ask your health care provider. Do not use street drugs. Do not share needles. Ask your health care provider for help if you need support or information about quitting drugs. Alcohol use Do not drink alcohol if: Your health care provider tells you not to drink. You are pregnant, may be pregnant, or are planning to become pregnant. If you drink alcohol: Limit how much you use to 0-1 drink a day. Limit intake if you are breastfeeding. Be  aware of how much alcohol is in your drink. In the [place] one drink equals one 12 oz bottle of beer (355 mL), one 5 oz glass of wine (148 mL), or one 1 oz glass of hard liquor (44 mL). General instructions Schedule regular health, dental, and eye exams. Stay current with your vaccines. Tell your health care  provider if: You often feel depressed. You have ever been abused or do not feel safe at home. Summary Adopting a healthy lifestyle and getting preventive care are important in promoting health and wellness. Follow your health care provider's instructions about healthy diet, exercising, and getting tested or screened for diseases. Follow your health care provider's instructions on monitoring your cholesterol and blood pressure. This information is not intended to replace advice given to you by your health care provider. Make sure you discuss any questions you have with your health care provider. Document Revised: 11/22/2020 Document Reviewed: 09/07/2018 Elsevier Patient Education  2022 [place], MD [facility]

## 2021-06-25 LAB — MICROALBUMIN, URINE: Microalb, Ur: 0.4 mg/dL

## 2021-07-30 ENCOUNTER — Encounter: Payer: Self-pay | Admitting: Emergency Medicine

## 2021-08-28 ENCOUNTER — Ambulatory Visit: Payer: Medicare PPO | Admitting: Emergency Medicine

## 2021-08-28 ENCOUNTER — Other Ambulatory Visit: Payer: Self-pay | Admitting: Emergency Medicine

## 2021-08-28 DIAGNOSIS — E1159 Type 2 diabetes mellitus with other circulatory complications: Secondary | ICD-10-CM

## 2021-10-25 ENCOUNTER — Other Ambulatory Visit: Payer: Self-pay | Admitting: Emergency Medicine

## 2021-10-25 DIAGNOSIS — E1159 Type 2 diabetes mellitus with other circulatory complications: Secondary | ICD-10-CM

## 2021-10-25 DIAGNOSIS — I10 Essential (primary) hypertension: Secondary | ICD-10-CM

## 2021-12-22 ENCOUNTER — Encounter: Payer: Self-pay | Admitting: Emergency Medicine

## 2021-12-22 ENCOUNTER — Other Ambulatory Visit: Payer: Self-pay

## 2021-12-22 ENCOUNTER — Ambulatory Visit: Payer: Medicare PPO | Admitting: Emergency Medicine

## 2021-12-22 VITALS — BP 132/84 | HR 84 | Temp 98.1°F | Ht 65.0 in | Wt 245.5 lb

## 2021-12-22 DIAGNOSIS — E1169 Type 2 diabetes mellitus with other specified complication: Secondary | ICD-10-CM | POA: Diagnosis not present

## 2021-12-22 DIAGNOSIS — E1159 Type 2 diabetes mellitus with other circulatory complications: Secondary | ICD-10-CM

## 2021-12-22 DIAGNOSIS — E669 Obesity, unspecified: Secondary | ICD-10-CM | POA: Diagnosis not present

## 2021-12-22 DIAGNOSIS — I152 Hypertension secondary to endocrine disorders: Secondary | ICD-10-CM

## 2021-12-22 DIAGNOSIS — I700 Atherosclerosis of aorta: Secondary | ICD-10-CM | POA: Diagnosis not present

## 2021-12-22 LAB — POCT GLYCOSYLATED HEMOGLOBIN (HGB A1C): Hemoglobin A1C: 5.7 % — AB (ref 4.0–5.6)

## 2021-12-22 NOTE — Assessment & Plan Note (Signed)
Stable and much improved.  Not losing much weight as desired but following good diet. ?Well-controlled hypertension.  Continue amlodipine 5 mg daily. ?BP Readings from Last 3 Encounters:  ?12/22/21 132/84  ?06/24/21 132/78  ?02/26/21 138/72  ?Well-controlled diabetes with hemoglobin A1c of 5.7. ?We will continue metformin 1000 mg twice a day. ?Patient wants to get off Trulicity.  We will stop it for 3 months and follow-up A1c then. ?Diet and nutrition discussed. ?Cardiovascular risks associated with hypertension and diabetes discussed. ?[name] in 3 months. ?Continue rosuvastatin 10 mg daily. ? ? ?

## 2021-12-22 NOTE — Progress Notes (Signed)
Katherine [name] ?66 y.o. ? ? ?Chief Complaint  ?Patient presents with  ? [name]  ? Groin Pain  ?  Rt groin , think she pulled a muscle. X 3weeks   ? Medication Management  ?  Wants to stop trulicity   ? ? ?HISTORY OF PRESENT ILLNESS: ?This is a 66 y.o. female with history of diabetes and hypertension here for follow-up. ?Eating better and exercising more.  Exercising in the pool. ?She may have pulled a groin muscle about 3 weeks ago while exercising in the pool. ?Taking metformin and Trulicity.  Not losing much weight.  Inquiring about stopping Trulicity. ?No other complaints or medical concerns today. ?Lab Results  ?Component Value Date  ? HGBA1C 6.3 06/24/2021  ? ? ? ?Groin Pain ?Pertinent negatives include no abdominal pain, chills, diarrhea, fever, headaches, nausea, rash or vomiting.  ? ? ?Prior to Admission medications   ?Medication Sig Start Date End Date Taking? Authorizing Provider  ?ACCU-CHEK GUIDE test strip TEST BLOOD GLUCOSE FOUR TIMES DAILY AS DIRECTED 01/20/21  Yes Rise Traeger, [name], MD  ?Accu-Chek Softclix Lancets lancets TEST BLOOD GLUCOSE FOUR TIMES DAILY AS DIRECTED 01/20/21  Yes Lenni Reckner, [name], MD  ?Alcohol Swabs (B-D SINGLE USE SWABS REGULAR) PADS USE  TO TEST BLOOD GLUCOSE FOUR TIMES DAILY AS DIRECTED 01/20/21  Yes Brittlyn Cloe, [name], MD  ?amLODipine (NORVASC) 5 MG tablet TAKE 1 TABLET EVERY DAY 10/26/21  Yes [name], MD  ?aspirin EC 81 MG tablet Take 81 mg by mouth daily.   Yes [provider]  ?blood glucose meter kit and supplies KIT Dispense based on patient and insurance preference. Use up to four times daily as directed. (FOR ICD-9 250.00, 250.01). 06/20/18  Yes Clara Smolen, [name], MD  ?Calcium Carb-Cholecalciferol (CALCIUM 600-D PO) Take by mouth.   Yes [provider]  ?Cholecalciferol (VITAMIN D3 PO) Take by mouth.   Yes [provider]  ?metFORMIN (GLUCOPHAGE) 500 MG tablet TAKE 1 TABLET TWICE DAILY WITH A MEAL 08/28/21  Yes  Johndaniel Catlin, [name], MD  ?Multiple Vitamin (MULTIVITAMIN) tablet Take 1 tablet by mouth daily.   Yes [provider]  ?Omega-3 Fatty Acids (OMEGA-3 2100 PO) Take by mouth daily.   Yes [provider]  ?OVER THE COUNTER MEDICATION 1,950 mg daily.   Yes [provider]  ?rosuvastatin (CRESTOR) 10 MG tablet TAKE 1 TABLET EVERY DAY 10/26/21  Yes Roe Wilner, [name], MD  ?vitamin B-12 (CYANOCOBALAMIN) 1000 MCG tablet Take 1,000 mcg by mouth daily.   Yes [provider]  ?Dulaglutide (TRULICITY) 9.37 TK/2.4OX SOPN Inject 0.75 mg into the skin once a week. ?Patient not taking: Reported on 12/22/2021 06/24/21   [name], MD  ? ? ?Allergies  ?Allergen Reactions  ? Ace Inhibitors Cough  ? Egg Yolk Other (See Comments)  ?  vomiting  ? Adhesive [Tape] Rash  ? ? ?Patient Active Problem List  ? Diagnosis Date Noted  ? Aortic atherosclerosis ([place]) 11/26/2020  ? Diverticulosis 11/26/2020  ? Obesity, diabetes, and hypertension syndrome ([place]) 06/05/2020  ? Body mass index (BMI) of 40.1-44.9 in adult [facility]) 12/04/2019  ? Class 2 severe obesity due to excess calories with serious comorbidity and body mass index (BMI) of 35.0 to 35.9 in adult [facility]) 10/19/2019  ? Osteoarthritis of spine with radiculopathy, cervical region 11/30/2017  ? Arthritis 06/23/2014  ? Prediabetes 10/20/2013  ? Obesity (BMI 35.0-39.9 without comorbidity) 10/20/2013  ? Hypertension associated with diabetes ([place])   ? ? ?Past  Medical History:  ?Diagnosis Date  ? Diabetes mellitus without complication ([place])   ? Hypertension   ? ? ?Past Surgical History:  ?Procedure Laterality Date  ? APPENDECTOMY    ? CHOLECYSTECTOMY    ? rotator cuff surgery Right 07/2014  ? ? ?Social History  ? ?Socioeconomic History  ? Marital status: Married  ?  Spouse name: Not on file  ? Number of children: 2  ? Years of education: Masters   ? Highest education level: Not on file  ?Occupational History  ? Occupation: Pharmacist, hospital  ?Tobacco Use  ?  Smoking status: Never  ? Smokeless tobacco: Never  ?Vaping Use  ? Vaping Use: Never used  ?Substance and Sexual Activity  ? Alcohol use: Yes  ?  Alcohol/week: 0.0 standard drinks  ?  Comment: social  ? Drug use: No  ? Sexual activity: Yes  ?Other Topics Concern  ? Not on file  ?Social History Narrative  ? Patient has had 2 pregnancies and has 2 living children.  ? ?Social Determinants of Health  ? ?Financial Resource Strain: Not on file  ?Food Insecurity: Not on file  ?Transportation Needs: Not on file  ?Physical Activity: Not on file  ?Stress: Not on file  ?Social Connections: Not on file  ?Intimate Partner Violence: Not on file  ? ? ?Family History  ?Problem Relation Age of Onset  ? Diabetes Father   ? Hypertension Father   ? Breast cancer Maternal Aunt   ?     6s  ? Cancer Maternal Grandmother   ?     breast cancer  ? Breast cancer Maternal Grandmother   ? Sickle cell anemia Brother   ? Cancer Brother 15  ?     Leukemia, passed away at 70  ? ? ? ?Review of Systems  ?Constitutional: Negative.  Negative for chills and fever.  ?HENT: Negative.    ?Respiratory: Negative.  Negative for cough and shortness of breath.   ?Cardiovascular: Negative.  Negative for chest pain and palpitations.  ?Gastrointestinal: Negative.  Negative for abdominal pain, diarrhea, nausea and vomiting.  ?Genitourinary: Negative.   ?Skin: Negative.  Negative for rash.  ?Neurological: Negative.  Negative for dizziness and headaches.  ? ?Today's Vitals  ? 12/22/21 0902  ?BP: 132/84  ?Pulse: 84  ?Temp: 98.1 ?F (36.7 ?C)  ?TempSrc: Oral  ?SpO2: 97%  ?Weight: 245 lb 8 oz (111.4 kg)  ?Height: 5' 5"  (1.651 m)  ? ?Body mass index is 40.85 kg/m?. ? ?Physical Exam ?Vitals reviewed.  ?Constitutional:   ?   Appearance: Normal appearance.  ?HENT:  ?   [name]: Normocephalic.  ?Eyes:  ?   Extraocular Movements: Extraocular movements intact.  ?   Pupils: Pupils are equal, round, and reactive to light.  ?Cardiovascular:  ?   Rate and Rhythm: Normal rate and  regular rhythm.  ?   Pulses: Normal pulses.  ?   Heart sounds: Normal heart sounds.  ?Pulmonary:  ?   Effort: Pulmonary effort is normal.  ?   Breath sounds: Normal breath sounds.  ?Abdominal:  ?   Palpations: Abdomen is soft.  ?   Tenderness: There is no abdominal tenderness.  ?Musculoskeletal:  ?   Cervical back: No tenderness.  ?   Right lower leg: No edema.  ?   Left lower leg: No edema.  ?Lymphadenopathy:  ?   Cervical: No cervical adenopathy.  ?Skin: ?   General: Skin is warm and dry.  ?Neurological:  ?   General:  No focal deficit present.  ?   Mental Status: She is alert and oriented to person, place, and time.  ?Psychiatric:     ?   Mood and Affect: Mood normal.     ?   Behavior: Behavior normal.  ? ? ?Results for orders placed or performed in visit on 12/22/21 (from the past 24 hour(s))  ?POCT HgB A1C     Status: Abnormal  ? Collection Time: 12/22/21  9:22 AM  ?Result Value Ref Range  ? Hemoglobin A1C 5.7 (A) 4.0 - 5.6 %  ? HbA1c POC (<> result, manual entry)    ? HbA1c, POC (prediabetic range)    ? HbA1c, POC (controlled diabetic range)    ? ? ?ASSESSMENT & PLAN: ?Problem List Items Addressed This Visit   ? ?  ? Cardiovascular and Mediastinum  ? Obesity, diabetes, and hypertension syndrome ([place]) - Primary  ?  Stable and much improved.  Not losing much weight as desired but following good diet. ?Well-controlled hypertension.  Continue amlodipine 5 mg daily. ?BP Readings from Last 3 Encounters:  ?12/22/21 132/84  ?06/24/21 132/78  ?02/26/21 138/72  ?Well-controlled diabetes with hemoglobin A1c of 5.7. ?We will continue metformin 1000 mg twice a day. ?Patient wants to get off Trulicity.  We will stop it for 3 months and follow-up A1c then. ?Diet and nutrition discussed. ?Cardiovascular risks associated with hypertension and diabetes discussed. ?[name] in 3 months. ?Continue rosuvastatin 10 mg daily. ? ? ?  ?  ? Relevant Orders  ? POCT HgB A1C (Completed)  ? Aortic atherosclerosis ([place])  ?  Stable.   Continue rosuvastatin 10 mg daily. ?  ?  ? ?Patient Instructions  ?Continue metformin 1000 mg twice a day. ?Stop Trulicity. ?[name] in 3 months. ?Diabetes Mellitus and Nutrition, Adult ?When you have diabetes, or dia

## 2021-12-22 NOTE — Assessment & Plan Note (Signed)
Stable.  Continue rosuvastatin 10 mg daily. 

## 2021-12-22 NOTE — Patient Instructions (Signed)
Continue metformin 1000 mg twice a day. ?Stop Trulicity. ?[name] in 3 months. ?Diabetes Mellitus and Nutrition, Adult ?When you have diabetes, or diabetes mellitus, it is very important to have healthy eating habits because your blood sugar (glucose) levels are greatly affected by what you eat and drink. Eating healthy foods in the right amounts, at about the same times every day, can help you: ?Manage your blood glucose. ?Lower your risk of heart disease. ?Improve your blood pressure. ?Reach or maintain a healthy weight. ?What can affect my meal plan? ?Every person with diabetes is different, and each person has different needs for a meal plan. Your health care provider may recommend that you work with a dietitian to make a meal plan that is best for you. Your meal plan may vary depending on factors such as: ?The calories you need. ?The medicines you take. ?Your weight. ?Your blood glucose, blood pressure, and cholesterol levels. ?Your activity level. ?Other health conditions you have, such as heart or kidney disease. ?How do carbohydrates affect me? ?Carbohydrates, also called carbs, affect your blood glucose level more than any other type of food. Eating carbs raises the amount of glucose in your blood. ?It is important to know how many carbs you can safely have in each meal. This is different for every person. Your dietitian can help you calculate how many carbs you should have at each meal and for each snack. ?How does alcohol affect me? ?Alcohol can cause a decrease in blood glucose (hypoglycemia), especially if you use insulin or take certain diabetes medicines by mouth. Hypoglycemia can be a life-threatening condition. Symptoms of hypoglycemia, such as sleepiness, dizziness, and confusion, are similar to symptoms of having too much alcohol. ?Do not drink alcohol if: ?Your health care provider tells you not to drink. ?You are pregnant, may be pregnant, or are planning to become pregnant. ?If you drink  alcohol: ?Limit how much you have to: ?0-1 drink a day for women. ?0-2 drinks a day for men. ?Know how much alcohol is in your drink. In the [place] one drink equals one 12 oz bottle of beer (355 mL), one 5 oz glass of wine (148 mL), or one 1? oz glass of hard liquor (44 mL). ?Keep yourself hydrated with water, diet soda, or unsweetened iced tea. Keep in mind that regular soda, juice, and other mixers may contain a lot of sugar and must be counted as carbs. ?What are tips for following this plan? ?Reading food labels ?Start by checking the serving size on the Nutrition Facts label of packaged foods and drinks. The number of calories and the amount of carbs, fats, and other nutrients listed on the label are based on one serving of the item. Many items contain more than one serving per package. ?Check the total grams (g) of carbs in one serving. ?Check the number of grams of saturated fats and trans fats in one serving. Choose foods that have a low amount or none of these fats. ?Check the number of milligrams (mg) of salt (sodium) in one serving. Most people should limit total sodium intake to less than 2,300 mg per day. ?Always check the nutrition information of foods labeled as "low-fat" or "nonfat." These foods may be higher in added sugar or refined carbs and should be avoided. ?Talk to your dietitian to identify your daily goals for nutrients listed on the label. ?Shopping ?Avoid buying canned, pre-made, or processed foods. These foods tend to be high in fat, sodium, and added sugar. ?Shop  around the outside edge of the grocery store. This is where you will most often find fresh fruits and vegetables, bulk grains, fresh meats, and fresh dairy products. ?Cooking ?Use low-heat cooking methods, such as baking, instead of high-heat cooking methods, such as deep frying. ?Cook using healthy oils, such as olive, canola, or sunflower oil. ?Avoid cooking with butter, cream, or high-fat meats. ?Meal planning ?Eat meals and  snacks regularly, preferably at the same times every day. Avoid going long periods of time without eating. ?Eat foods that are high in fiber, such as fresh fruits, vegetables, beans, and whole grains. ?Eat 4-6 oz (112-168 g) of lean protein each day, such as lean meat, chicken, fish, eggs, or tofu. One ounce (oz) (28 g) of lean protein is equal to: ?1 oz (28 g) of meat, chicken, or fish. ?1 egg. ?? cup (62 g) of tofu. ?Eat some foods each day that contain healthy fats, such as avocado, nuts, seeds, and fish. ?What foods should I eat? ?Fruits ?Berries. Apples. Oranges. Peaches. Apricots. Plums. Grapes. Mangoes. Papayas. Pomegranates. Kiwi. Cherries. ?Vegetables ?Leafy greens, including lettuce, spinach, kale, chard, collard greens, mustard greens, and cabbage. Beets. Cauliflower. Broccoli. Carrots. Green beans. Tomatoes. Peppers. Onions. Cucumbers. Brussels sprouts. ?Grains ?Whole grains, such as whole-wheat or whole-grain bread, crackers, tortillas, cereal, and pasta. Unsweetened oatmeal. Quinoa. Brown or wild rice. ?Meats and other proteins ?Seafood. Poultry without skin. Lean cuts of poultry and beef. Tofu. Nuts. Seeds. ?Dairy ?Low-fat or fat-free dairy products such as milk, yogurt, and cheese. ?The items listed above may not be a complete list of foods and beverages you can eat and drink. Contact a dietitian for more information. ?What foods should I avoid? ?Fruits ?Fruits canned with syrup. ?Vegetables ?Canned vegetables. Frozen vegetables with butter or cream sauce. ?Grains ?Refined white flour and flour products such as bread, pasta, snack foods, and cereals. Avoid all processed foods. ?Meats and other proteins ?Fatty cuts of meat. Poultry with skin. Breaded or fried meats. Processed meat. Avoid saturated fats. ?Dairy ?Full-fat yogurt, cheese, or milk. ?Beverages ?Sweetened drinks, such as soda or iced tea. ?The items listed above may not be a complete list of foods and beverages you should avoid. Contact a  dietitian for more information. ?Questions to ask a health care provider ?Do I need to meet with a certified diabetes care and education specialist? ?Do I need to meet with a dietitian? ?What number can I call if I have questions? ?When are the best times to check my blood glucose? ?Where to find more information: ?American Diabetes Association: diabetes.org ?Academy of Nutrition and Dietetics: eatright.org ?[employer] of Diabetes and Digestive and Kidney Diseases: StageSync.si ?Association of Diabetes Care & Education Specialists: diabeteseducator.org ?Summary ?It is important to have healthy eating habits because your blood sugar (glucose) levels are greatly affected by what you eat and drink. It is important to use alcohol carefully. ?A healthy meal plan will help you manage your blood glucose and lower your risk of heart disease. ?Your health care provider may recommend that you work with a dietitian to make a meal plan that is best for you. ?This information is not intended to replace advice given to you by your health care provider. Make sure you discuss any questions you have with your health care provider. ?Document Revised: 04/17/2020 Document Reviewed: 04/17/2020 ?Elsevier Patient Education ? 2022 Elsevier Inc. ? ?

## 2022-01-21 ENCOUNTER — Other Ambulatory Visit: Payer: Self-pay | Admitting: Emergency Medicine

## 2022-01-21 DIAGNOSIS — I152 Hypertension secondary to endocrine disorders: Secondary | ICD-10-CM

## 2022-01-21 DIAGNOSIS — R7303 Prediabetes: Secondary | ICD-10-CM

## 2022-02-26 ENCOUNTER — Other Ambulatory Visit: Payer: Self-pay | Admitting: Emergency Medicine

## 2022-02-26 DIAGNOSIS — Z1231 Encounter for screening mammogram for malignant neoplasm of breast: Secondary | ICD-10-CM

## 2022-03-11 ENCOUNTER — Encounter: Payer: Self-pay | Admitting: Emergency Medicine

## 2022-03-11 NOTE — Telephone Encounter (Signed)
Yes, it is recommended.

## 2022-03-23 ENCOUNTER — Ambulatory Visit (INDEPENDENT_AMBULATORY_CARE_PROVIDER_SITE_OTHER): Payer: Medicare PPO

## 2022-03-23 ENCOUNTER — Encounter: Payer: Self-pay | Admitting: Emergency Medicine

## 2022-03-23 ENCOUNTER — Ambulatory Visit: Payer: Medicare PPO | Admitting: Emergency Medicine

## 2022-03-23 VITALS — BP 120/70 | HR 70 | Temp 98.3°F | Ht 65.0 in | Wt 243.2 lb

## 2022-03-23 VITALS — BP 120/70 | HR 70 | Temp 98.3°F | Wt 243.1 lb

## 2022-03-23 DIAGNOSIS — E669 Obesity, unspecified: Secondary | ICD-10-CM | POA: Diagnosis not present

## 2022-03-23 DIAGNOSIS — I700 Atherosclerosis of aorta: Secondary | ICD-10-CM

## 2022-03-23 DIAGNOSIS — I152 Hypertension secondary to endocrine disorders: Secondary | ICD-10-CM | POA: Diagnosis not present

## 2022-03-23 DIAGNOSIS — E1159 Type 2 diabetes mellitus with other circulatory complications: Secondary | ICD-10-CM

## 2022-03-23 DIAGNOSIS — E1169 Type 2 diabetes mellitus with other specified complication: Secondary | ICD-10-CM | POA: Diagnosis not present

## 2022-03-23 DIAGNOSIS — Z0000 Encounter for general adult medical examination without abnormal findings: Secondary | ICD-10-CM

## 2022-03-23 DIAGNOSIS — Z13820 Encounter for screening for osteoporosis: Secondary | ICD-10-CM

## 2022-03-23 LAB — CBC WITH DIFFERENTIAL/PLATELET
Basophils Absolute: 0 10*3/uL (ref 0.0–0.1)
Basophils Relative: 0.9 % (ref 0.0–3.0)
Eosinophils Absolute: 0.1 10*3/uL (ref 0.0–0.7)
Eosinophils Relative: 2.3 % (ref 0.0–5.0)
HCT: 40.9 % (ref 36.0–46.0)
Hemoglobin: 13.9 g/dL (ref 12.0–15.0)
Lymphocytes Relative: 55.2 % — ABNORMAL HIGH (ref 12.0–46.0)
Lymphs Abs: 2.5 10*3/uL (ref 0.7–4.0)
MCHC: 33.9 g/dL (ref 30.0–36.0)
MCV: 82.9 fl (ref 78.0–100.0)
Monocytes Absolute: 0.3 10*3/uL (ref 0.1–1.0)
Monocytes Relative: 6.8 % (ref 3.0–12.0)
Neutro Abs: 1.6 10*3/uL (ref 1.4–7.7)
Neutrophils Relative %: 34.8 % — ABNORMAL LOW (ref 43.0–77.0)
Platelets: 233 10*3/uL (ref 150.0–400.0)
RBC: 4.94 Mil/uL (ref 3.87–5.11)
RDW: 14.8 % (ref 11.5–15.5)
WBC: 4.5 10*3/uL (ref 4.0–10.5)

## 2022-03-23 LAB — COMPREHENSIVE METABOLIC PANEL
ALT: 20 U/L (ref 0–35)
AST: 21 U/L (ref 0–37)
Albumin: 4.3 g/dL (ref 3.5–5.2)
Alkaline Phosphatase: 83 U/L (ref 39–117)
BUN: 13 mg/dL (ref 6–23)
CO2: 31 mEq/L (ref 19–32)
Calcium: 10 mg/dL (ref 8.4–10.5)
Chloride: 102 mEq/L (ref 96–112)
Creatinine, Ser: 0.9 mg/dL (ref 0.40–1.20)
GFR: 65.95 mL/min (ref >60.00)
Glucose, Bld: 117 mg/dL — ABNORMAL HIGH (ref 70–99)
Potassium: 3.8 mEq/L (ref 3.5–5.1)
Sodium: 140 mEq/L (ref 135–145)
Total Bilirubin: 0.6 mg/dL (ref 0.2–1.2)
Total Protein: 7.6 g/dL (ref 6.0–8.3)

## 2022-03-23 LAB — LIPID PANEL
Cholesterol: 106 mg/dL (ref 0–200)
HDL: 50.8 mg/dL (ref >39.00)
LDL Cholesterol: 37 mg/dL (ref 0–99)
NonHDL: 55.53
Total CHOL/HDL Ratio: 2
Triglycerides: 92 mg/dL (ref 0.0–149.0)
VLDL: 18.4 mg/dL (ref 0.0–40.0)

## 2022-03-23 LAB — MICROALBUMIN / CREATININE URINE RATIO
Creatinine,U: 78.8 mg/dL
Microalb Creat Ratio: 0.9 mg/g (ref 0.0–30.0)
Microalb, Ur: 0.7 mg/dL (ref 0.0–1.9)

## 2022-03-23 LAB — HEMOGLOBIN A1C: Hgb A1c MFr Bld: 6.3 % (ref 4.6–6.5)

## 2022-04-13 ENCOUNTER — Ambulatory Visit: Payer: Medicare PPO

## 2022-04-13 ENCOUNTER — Ambulatory Visit
Admission: RE | Admit: 2022-04-13 | Discharge: 2022-04-13 | Disposition: A | Discharge status: Home / Self Care | Payer: Medicare PPO | Source: Ambulatory Visit | Attending: Emergency Medicine | Admitting: Emergency Medicine

## 2022-04-13 DIAGNOSIS — Z13820 Encounter for screening for osteoporosis: Secondary | ICD-10-CM

## 2022-04-13 DIAGNOSIS — Z780 Asymptomatic menopausal state: Secondary | ICD-10-CM | POA: Diagnosis not present

## 2022-04-14 ENCOUNTER — Ambulatory Visit
Admission: RE | Admit: 2022-04-14 | Discharge: 2022-04-14 | Disposition: A | Discharge status: Home / Self Care | Payer: Medicare PPO | Source: Ambulatory Visit | Attending: Emergency Medicine | Admitting: Emergency Medicine

## 2022-04-14 DIAGNOSIS — Z1231 Encounter for screening mammogram for malignant neoplasm of breast: Secondary | ICD-10-CM

## 2022-04-14 DIAGNOSIS — Z86010 Personal history of colonic polyps: Secondary | ICD-10-CM | POA: Diagnosis not present

## 2022-04-14 DIAGNOSIS — K5730 Diverticulosis of large intestine without perforation or abscess without bleeding: Secondary | ICD-10-CM | POA: Diagnosis not present

## 2022-04-14 DIAGNOSIS — Z1211 Encounter for screening for malignant neoplasm of colon: Secondary | ICD-10-CM | POA: Diagnosis not present

## 2022-04-14 DIAGNOSIS — E782 Mixed hyperlipidemia: Secondary | ICD-10-CM | POA: Diagnosis not present

## 2022-04-14 DIAGNOSIS — I10 Essential (primary) hypertension: Secondary | ICD-10-CM | POA: Diagnosis not present

## 2022-05-29 ENCOUNTER — Telehealth: Payer: Self-pay | Admitting: Emergency Medicine

## 2022-05-29 NOTE — Telephone Encounter (Signed)
Patient is scheduled for her PPD test next Wednesday

## 2022-05-29 NOTE — Telephone Encounter (Signed)
Patient would to receive a tb test - please advise.

## 2022-06-03 ENCOUNTER — Ambulatory Visit (INDEPENDENT_AMBULATORY_CARE_PROVIDER_SITE_OTHER): Payer: Medicare PPO

## 2022-06-03 ENCOUNTER — Other Ambulatory Visit: Payer: Self-pay | Admitting: Emergency Medicine

## 2022-06-03 DIAGNOSIS — Z111 Encounter for screening for respiratory tuberculosis: Secondary | ICD-10-CM

## 2022-06-03 DIAGNOSIS — E1159 Type 2 diabetes mellitus with other circulatory complications: Secondary | ICD-10-CM

## 2022-06-03 DIAGNOSIS — Z23 Encounter for immunization: Secondary | ICD-10-CM

## 2022-06-03 NOTE — Progress Notes (Signed)
Tuberculin skin test applied to left ventral forearm. Explained to pt to come back to the office on Friday for measuring induration.

## 2022-06-03 NOTE — Progress Notes (Signed)
After obtaining consent, and per orders of Dr. Alvy Bimler, injection of fluarix given on the left deltoid by Ferdie Ping. Patient tolerated well and instructed to report any adverse reaction to me immediately.

## 2022-06-05 ENCOUNTER — Ambulatory Visit: Payer: Medicare PPO

## 2022-06-05 ENCOUNTER — Encounter: Payer: Self-pay | Admitting: Emergency Medicine

## 2022-06-05 LAB — TB SKIN TEST
Induration: 0 mm
TB Skin Test: NEGATIVE

## 2022-06-05 NOTE — Progress Notes (Signed)
TB skin test was red and result was negative with 27mm induration

## 2022-06-12 DIAGNOSIS — K5730 Diverticulosis of large intestine without perforation or abscess without bleeding: Secondary | ICD-10-CM | POA: Diagnosis not present

## 2022-06-12 DIAGNOSIS — Z86010 Personal history of colonic polyps: Secondary | ICD-10-CM | POA: Diagnosis not present

## 2022-06-12 DIAGNOSIS — Z1211 Encounter for screening for malignant neoplasm of colon: Secondary | ICD-10-CM | POA: Diagnosis not present

## 2022-08-25 DIAGNOSIS — H524 Presbyopia: Secondary | ICD-10-CM | POA: Diagnosis not present

## 2022-08-25 DIAGNOSIS — E119 Type 2 diabetes mellitus without complications: Secondary | ICD-10-CM | POA: Diagnosis not present

## 2022-08-25 LAB — HM DIABETES EYE EXAM

## 2022-09-24 ENCOUNTER — Ambulatory Visit: Payer: Medicare PPO | Admitting: Emergency Medicine

## 2022-10-28 ENCOUNTER — Other Ambulatory Visit: Payer: Self-pay | Admitting: Emergency Medicine

## 2022-10-28 DIAGNOSIS — I10 Essential (primary) hypertension: Secondary | ICD-10-CM

## 2022-10-28 DIAGNOSIS — I152 Hypertension secondary to endocrine disorders: Secondary | ICD-10-CM

## 2022-11-04 ENCOUNTER — Other Ambulatory Visit: Payer: Self-pay | Admitting: Emergency Medicine

## 2022-11-04 DIAGNOSIS — E1159 Type 2 diabetes mellitus with other circulatory complications: Secondary | ICD-10-CM

## 2022-12-01 ENCOUNTER — Ambulatory Visit (INDEPENDENT_AMBULATORY_CARE_PROVIDER_SITE_OTHER): Payer: Medicare PPO | Admitting: Emergency Medicine

## 2022-12-01 ENCOUNTER — Encounter: Payer: Self-pay | Admitting: Emergency Medicine

## 2022-12-01 VITALS — BP 122/78 | HR 70 | Temp 98.0°F | Ht 65.0 in | Wt 248.4 lb

## 2022-12-01 DIAGNOSIS — Z13220 Encounter for screening for lipoid disorders: Secondary | ICD-10-CM | POA: Diagnosis not present

## 2022-12-01 DIAGNOSIS — E1159 Type 2 diabetes mellitus with other circulatory complications: Secondary | ICD-10-CM

## 2022-12-01 DIAGNOSIS — Z130 Encounter for screening for diseases of the blood and blood-forming organs and certain disorders involving the immune mechanism: Secondary | ICD-10-CM

## 2022-12-01 DIAGNOSIS — I152 Hypertension secondary to endocrine disorders: Secondary | ICD-10-CM

## 2022-12-01 DIAGNOSIS — F5104 Psychophysiologic insomnia: Secondary | ICD-10-CM | POA: Insufficient documentation

## 2022-12-01 DIAGNOSIS — Z1329 Encounter for screening for other suspected endocrine disorder: Secondary | ICD-10-CM | POA: Diagnosis not present

## 2022-12-01 DIAGNOSIS — Z0000 Encounter for general adult medical examination without abnormal findings: Secondary | ICD-10-CM | POA: Diagnosis not present

## 2022-12-01 DIAGNOSIS — Z13228 Encounter for screening for other metabolic disorders: Secondary | ICD-10-CM

## 2022-12-01 LAB — CBC WITH DIFFERENTIAL/PLATELET
Basophils Absolute: 0.1 10*3/uL (ref 0.0–0.1)
Basophils Relative: 1.8 % (ref 0.0–3.0)
Eosinophils Absolute: 0.1 10*3/uL (ref 0.0–0.7)
Eosinophils Relative: 2.2 % (ref 0.0–5.0)
HCT: 42.6 % (ref 36.0–46.0)
Hemoglobin: 14.5 g/dL (ref 12.0–15.0)
Lymphocytes Relative: 54.5 % — ABNORMAL HIGH (ref 12.0–46.0)
Lymphs Abs: 2.5 10*3/uL (ref 0.7–4.0)
MCHC: 34.1 g/dL (ref 30.0–36.0)
MCV: 82.7 fl (ref 78.0–100.0)
Monocytes Absolute: 0.3 10*3/uL (ref 0.1–1.0)
Monocytes Relative: 7 % (ref 3.0–12.0)
Neutro Abs: 1.6 10*3/uL (ref 1.4–7.7)
Neutrophils Relative %: 34.5 % — ABNORMAL LOW (ref 43.0–77.0)
Platelets: 248 10*3/uL (ref 150.0–400.0)
RBC: 5.15 Mil/uL — ABNORMAL HIGH (ref 3.87–5.11)
RDW: 15.1 % (ref 11.5–15.5)
WBC: 4.5 10*3/uL (ref 4.0–10.5)

## 2022-12-01 LAB — COMPREHENSIVE METABOLIC PANEL
ALT: 15 U/L (ref 0–35)
AST: 18 U/L (ref 0–37)
Albumin: 4.1 g/dL (ref 3.5–5.2)
Alkaline Phosphatase: 92 U/L (ref 39–117)
BUN: 15 mg/dL (ref 6–23)
CO2: 32 mEq/L (ref 19–32)
Calcium: 10.1 mg/dL (ref 8.4–10.5)
Chloride: 103 mEq/L (ref 96–112)
Creatinine, Ser: 0.97 mg/dL (ref 0.40–1.20)
GFR: 59.99 mL/min — ABNORMAL LOW (ref >60.00)
Glucose, Bld: 106 mg/dL — ABNORMAL HIGH (ref 70–99)
Potassium: 4.3 mEq/L (ref 3.5–5.1)
Sodium: 141 mEq/L (ref 135–145)
Total Bilirubin: 0.6 mg/dL (ref 0.2–1.2)
Total Protein: 7.9 g/dL (ref 6.0–8.3)

## 2022-12-01 LAB — LIPID PANEL
Cholesterol: 188 mg/dL (ref 0–200)
HDL: 54.9 mg/dL (ref >39.00)
LDL Cholesterol: 111 mg/dL — ABNORMAL HIGH (ref 0–99)
NonHDL: 133.08
Total CHOL/HDL Ratio: 3
Triglycerides: 108 mg/dL (ref 0.0–149.0)
VLDL: 21.6 mg/dL (ref 0.0–40.0)

## 2022-12-01 LAB — HEMOGLOBIN A1C: Hgb A1c MFr Bld: 6.3 % (ref 4.6–6.5)

## 2022-12-01 NOTE — Assessment & Plan Note (Signed)
Sleep hygiene discussed. Recommend melatonin 10 to 20 mg at bedtime as needed.

## 2022-12-01 NOTE — Assessment & Plan Note (Addendum)
Well-controlled hypertension. Continue amlodipine 5 mg daily Well-controlled diabetes. Continue metformin 500 mg twice a day Blood work done today. Diet and nutrition discussed. Continue rosuvastatin 10 mg daily.

## 2022-12-01 NOTE — Progress Notes (Signed)
Katherine [name] 67 y.o.   Chief Complaint  Patient presents with   Annual Exam    Patient states she is having some sleep issues     HISTORY OF PRESENT ILLNESS: This is a 67 y.o. female in for annual exam. Overall doing well. Has occasional sleeping issues No other complaints or medical concerns today.  HPI   Prior to Admission medications   Medication Sig Start Date End Date Taking? Authorizing Provider  ACCU-CHEK GUIDE test strip TEST BLOOD GLUCOSE FOUR TIMES DAILY AS DIRECTED 01/21/22  Yes Lylith Bebeau, [name], MD  Accu-Chek Softclix Lancets lancets TEST BLOOD GLUCOSE FOUR TIMES DAILY AS DIRECTED 01/21/22  Yes [name], MD  Alcohol Swabs (DROPSAFE ALCOHOL PREP) 70 % PADS USE FOUR TIMES DAILY AS DIRECTED 01/21/22  Yes [name], MD  amLODipine (NORVASC) 5 MG tablet TAKE 1 TABLET EVERY DAY 10/28/22  Yes [name], MD  aspirin EC 81 MG tablet Take 81 mg by mouth daily.   Yes [provider]  blood glucose meter kit and supplies KIT Dispense based on patient and insurance preference. Use up to four times daily as directed. (FOR ICD-9 250.00, 250.01). 06/20/18  Yes Sarina Robleto, [name], MD  Calcium Carb-Cholecalciferol (CALCIUM 600-D PO) Take by mouth.   Yes [provider]  Cholecalciferol (VITAMIN D3 PO) Take by mouth.   Yes [provider]  metFORMIN (GLUCOPHAGE) 500 MG tablet TAKE 1 TAB 2XDAY WITH MEALS, FOLLOW UP APPOINTMENT DUE IN DECEMBER; MUST SEE PROVIDER FOR REFILLS 11/04/22  Yes Syed Zukas, [name], MD  Multiple Vitamin (MULTIVITAMIN) tablet Take 1 tablet by mouth daily.   Yes [provider]  Omega-3 Fatty Acids (OMEGA-3 2100 PO) Take by mouth daily.   Yes [provider]  OVER THE COUNTER MEDICATION 1,950 mg daily.   Yes [provider]  rosuvastatin (CRESTOR) 10 MG tablet TAKE 1 TABLET EVERY DAY 10/28/22  Yes Cristian Davitt, [name], MD  vitamin B-12 (CYANOCOBALAMIN) 1000 MCG tablet  Take 1,000 mcg by mouth daily.   Yes [provider]    Allergies  Allergen Reactions   Ace Inhibitors Cough   Egg Yolk Other (See Comments)    vomiting   Adhesive [Tape] Rash    Patient Active Problem List   Diagnosis Date Noted   Aortic atherosclerosis ([place]) 11/26/2020   Diverticulosis 11/26/2020   Obesity, diabetes, and hypertension syndrome ([place]) 06/05/2020   Body mass index (BMI) of 40.1-44.9 in adult ([place]) 12/04/2019   Class 2 severe obesity due to excess calories with serious comorbidity and body mass index (BMI) of 35.0 to 35.9 in adult [facility]) 10/19/2019   Osteoarthritis of spine with radiculopathy, cervical region 11/30/2017   Arthritis 06/23/2014   Prediabetes 10/20/2013   Obesity (BMI 35.0-39.9 without comorbidity) 10/20/2013   Hypertension associated with diabetes ([place])     Past Medical History:  Diagnosis Date   Diabetes mellitus without complication ([place])    Hypertension     Past Surgical History:  Procedure Laterality Date   APPENDECTOMY     CHOLECYSTECTOMY     rotator cuff surgery Right 07/2014    Social History   Socioeconomic History   Marital status: Married    Spouse name: Not on file   Number of children: 2   Years of education: Masters    Highest education level: Not on file  Occupational History   Occupation: Teacher  Tobacco Use   Smoking status: Never   Smokeless tobacco: Never  Vaping  Use   Vaping Use: Never used  Substance and Sexual Activity   Alcohol use: Yes    Alcohol/week: 0.0 standard drinks of alcohol    Comment: social   Drug use: No   Sexual activity: Yes  Other Topics Concern   Not on file  Social History Narrative   Patient has had 2 pregnancies and has 2 living children.   Social Determinants of Health   Financial Resource Strain: Low Risk  (03/23/2022)   Overall Financial Resource Strain (CARDIA)    Difficulty of Paying Living Expenses: Not hard at all  Food Insecurity: No Food Insecurity (03/23/2022)    Hunger Vital Sign    Worried About Running Out of Food in the Last Year: Never true    Ran Out of Food in the Last Year: Never true  Transportation Needs: No Transportation Needs (03/23/2022)   PRAPARE - Hydrologist (Medical): No    Lack of Transportation (Non-Medical): No  Physical Activity: Sufficiently Active (03/23/2022)   Exercise Vital Sign    Days of Exercise per Week: 5 days    Minutes of Exercise per Session: 30 min  Stress: No Stress Concern Present (03/23/2022)   [place]    Feeling of Stress : Not at all  Social Connections: [place] (03/23/2022)   Social Connection and Isolation Panel [NHANES]    Frequency of Communication with Friends and Family: Not on file    Frequency of Social Gatherings with Friends and Family: More than three times a week    Attends Religious Services: More than 4 times per year    Active Member of [employer] or Organizations: Yes    Attends Music therapist: More than 4 times per year    Marital Status: Married  Human resources officer Violence: Not At Risk (03/23/2022)   Humiliation, Afraid, Rape, and Kick questionnaire    Fear of Current or Ex-Partner: No    Emotionally Abused: No    Physically Abused: No    Sexually Abused: No    Family History  Problem Relation Age of Onset   Diabetes Father    Hypertension Father    Breast cancer Maternal Aunt        110s   Cancer Maternal Grandmother        breast cancer   Breast cancer Maternal Grandmother    Sickle cell anemia Brother    Cancer Brother 15       Leukemia, passed away at 70     Review of Systems  Constitutional: Negative.  Negative for chills and fever.  HENT: Negative.  Negative for congestion and sore throat.   Respiratory: Negative.  Negative for cough and shortness of breath.   Cardiovascular: Negative.  Negative for chest pain and palpitations.  Gastrointestinal:  Negative.  Negative for abdominal pain, diarrhea, nausea and vomiting.  Genitourinary: Negative.  Negative for dysuria and hematuria.  Skin: Negative.  Negative for rash.  Neurological: Negative.  Negative for dizziness and headaches.  Psychiatric/Behavioral:  The patient has insomnia.   All other systems reviewed and are negative.  Today's Vitals   12/01/22 0940  BP: 122/78  Pulse: 70  Temp: 98 F (36.7 C)  TempSrc: Oral  SpO2: 97%  Weight: 248 lb 6 oz (112.7 kg)  Height: '5\' 5"'$  (1.651 m)   Body mass index is 41.33 kg/m.   Physical Exam Vitals reviewed.  Constitutional:      Appearance:  Normal appearance.  HENT:     [name]: Normocephalic.     Right Ear: Tympanic membrane, ear canal and external ear normal.     Left Ear: Tympanic membrane, ear canal and external ear normal.     Mouth/Throat:     Mouth: Mucous membranes are moist.     Pharynx: Oropharynx is clear.  Eyes:     Extraocular Movements: Extraocular movements intact.     Conjunctiva/sclera: Conjunctivae normal.     Pupils: Pupils are equal, round, and reactive to light.  Cardiovascular:     Rate and Rhythm: Normal rate and regular rhythm.     Pulses: Normal pulses.     Heart sounds: Normal heart sounds.  Pulmonary:     Effort: Pulmonary effort is normal.     Breath sounds: Normal breath sounds.  Abdominal:     General: There is no distension.     Palpations: Abdomen is soft.     Tenderness: There is no abdominal tenderness.  Musculoskeletal:     Cervical back: No tenderness.  Lymphadenopathy:     Cervical: No cervical adenopathy.  Skin:    General: Skin is warm and dry.  Neurological:     General: No focal deficit present.     Mental Status: She is alert and oriented to person, place, and time.  Psychiatric:        Mood and Affect: Mood normal.        Behavior: Behavior normal.      ASSESSMENT & PLAN: Problem List Items Addressed This Visit       Cardiovascular and Mediastinum   Hypertension  associated with diabetes ([place])    Well-controlled hypertension. Continue amlodipine 5 mg daily Well-controlled diabetes. Continue metformin 500 mg twice a day Blood work done today. Diet and nutrition discussed. Continue rosuvastatin 10 mg daily.        Other   Chronic insomnia    Sleep hygiene discussed. Recommend melatonin 10 to 20 mg at bedtime as needed.      Other Visit Diagnoses     Routine general medical examination at a health care facility    -  Primary   Relevant Orders   CBC with Differential   Comprehensive metabolic panel   Hemoglobin A1c   Lipid panel   Screening for deficiency anemia       Relevant Orders   CBC with Differential   Screening for lipoid disorders       Relevant Orders   Lipid panel   Screening for endocrine, metabolic and immunity disorder       Relevant Orders   Comprehensive metabolic panel   Hemoglobin A1c        Modifiable risk factors discussed with patient. Anticipatory guidance according to age provided. The following topics were also discussed: Social Determinants of Health Smoking.  Non-smoker Diet and nutrition and need to decrease amount of daily carbohydrate intake and daily calories and increase amount of plant-based protein in her diet Benefits of exercise Cancer screening and review of most recent mammogram and colonoscopy reports. Vaccinations reviewed and recommendations Cardiovascular risk assessment and need for blood work today Mental health including depression and anxiety Fall and accident prevention  Patient Instructions  Health Maintenance, Female Adopting a healthy lifestyle and getting preventive care are important in promoting health and wellness. Ask your health care provider about: The right schedule for you to have regular tests and exams. Things you can do on your own to prevent diseases and keep yourself  healthy. What should I know about diet, weight, and exercise? Eat a healthy diet  Eat a  diet that includes plenty of vegetables, fruits, low-fat dairy products, and lean protein. Do not eat a lot of foods that are high in solid fats, added sugars, or sodium. Maintain a healthy weight Body mass index (BMI) is used to identify weight problems. It estimates body fat based on height and weight. Your health care provider can help determine your BMI and help you achieve or maintain a healthy weight. Get regular exercise Get regular exercise. This is one of the most important things you can do for your health. Most adults should: Exercise for at least 150 minutes each week. The exercise should increase your heart rate and make you sweat (moderate-intensity exercise). Do strengthening exercises at least twice a week. This is in addition to the moderate-intensity exercise. Spend less time sitting. Even light physical activity can be beneficial. Watch cholesterol and blood lipids Have your blood tested for lipids and cholesterol at 69 years of age, then have this test every 5 years. Have your cholesterol levels checked more often if: Your lipid or cholesterol levels are high. You are older than 69 years of age. You are at high risk for heart disease. What should I know about cancer screening? Depending on your health history and family history, you may need to have cancer screening at various ages. This may include screening for: Breast cancer. Cervical cancer. Colorectal cancer. Skin cancer. Lung cancer. What should I know about heart disease, diabetes, and high blood pressure? Blood pressure and heart disease High blood pressure causes heart disease and increases the risk of stroke. This is more likely to develop in people who have high blood pressure readings or are overweight. Have your blood pressure checked: Every 3-5 years if you are 5-5 years of age. Every year if you are 86 years old or older. Diabetes Have regular diabetes screenings. This checks your fasting blood sugar  level. Have the screening done: Once every three years after age 58 if you are at a normal weight and have a low risk for diabetes. More often and at a younger age if you are overweight or have a high risk for diabetes. What should I know about preventing infection? Hepatitis B If you have a higher risk for hepatitis B, you should be screened for this virus. Talk with your health care provider to find out if you are at risk for hepatitis B infection. Hepatitis C Testing is recommended for: Everyone born from [birth year] through 1967. Anyone with known risk factors for hepatitis C. Sexually transmitted infections (STIs) Get screened for STIs, including gonorrhea and chlamydia, if: You are sexually active and are younger than 69 years of age. You are older than 69 years of age and your health care provider tells you that you are at risk for this type of infection. Your sexual activity has changed since you were last screened, and you are at increased risk for chlamydia or gonorrhea. Ask your health care provider if you are at risk. Ask your health care provider about whether you are at high risk for HIV. Your health care provider may recommend a prescription medicine to help prevent HIV infection. If you choose to take medicine to prevent HIV, you should first get tested for HIV. You should then be tested every 3 months for as long as you are taking the medicine. Pregnancy If you are about to stop having your period (premenopausal) and you  may become pregnant, seek counseling before you get pregnant. Take 400 to 800 micrograms (mcg) of folic acid every day if you become pregnant. Ask for birth control (contraception) if you want to prevent pregnancy. Osteoporosis and menopause Osteoporosis is a disease in which the bones lose minerals and strength with aging. This can result in bone fractures. If you are 35 years old or older, or if you are at risk for osteoporosis and fractures, ask your health care  provider if you should: Be screened for bone loss. Take a calcium or vitamin D supplement to lower your risk of fractures. Be given hormone replacement therapy (HRT) to treat symptoms of menopause. Follow these instructions at home: Alcohol use Do not drink alcohol if: Your health care provider tells you not to drink. You are pregnant, may be pregnant, or are planning to become pregnant. If you drink alcohol: Limit how much you have to: 0-1 drink a day. Know how much alcohol is in your drink. In the [place] one drink equals one 12 oz bottle of beer (355 mL), one 5 oz glass of wine (148 mL), or one 1 oz glass of hard liquor (44 mL). Lifestyle Do not use any products that contain nicotine or tobacco. These products include cigarettes, chewing tobacco, and vaping devices, such as e-cigarettes. If you need help quitting, ask your health care provider. Do not use street drugs. Do not share needles. Ask your health care provider for help if you need support or information about quitting drugs. General instructions Schedule regular health, dental, and eye exams. Stay current with your vaccines. Tell your health care provider if: You often feel depressed. You have ever been abused or do not feel safe at home. Summary Adopting a healthy lifestyle and getting preventive care are important in promoting health and wellness. Follow your health care provider's instructions about healthy diet, exercising, and getting tested or screened for diseases. Follow your health care provider's instructions on monitoring your cholesterol and blood pressure. This information is not intended to replace advice given to you by your health care provider. Make sure you discuss any questions you have with your health care provider. Document Revised: 02/03/2021 Document Reviewed: 02/03/2021 Elsevier Patient Education  [place], MD [facility]

## 2022-12-01 NOTE — Patient Instructions (Addendum)

## 2022-12-04 ENCOUNTER — Telehealth: Payer: Self-pay | Admitting: Oncology

## 2022-12-04 ENCOUNTER — Other Ambulatory Visit: Payer: Self-pay | Admitting: Emergency Medicine

## 2022-12-04 ENCOUNTER — Encounter: Payer: Self-pay | Admitting: Emergency Medicine

## 2022-12-04 DIAGNOSIS — D72820 Lymphocytosis (symptomatic): Secondary | ICD-10-CM

## 2022-12-04 NOTE — Telephone Encounter (Signed)
scheduled per 3/8 referral , pt has been called and confirmed date and time. Pt is aware of location and to arrive early for check in

## 2022-12-04 NOTE — Telephone Encounter (Signed)
Not much to do.  I do not have any concerns with these numbers but we can place a referral to hematologist for evaluation of persistent lymphocytosis.  Referral placed today.  Thanks.

## 2022-12-08 ENCOUNTER — Encounter: Payer: Self-pay | Admitting: Emergency Medicine

## 2023-01-01 ENCOUNTER — Inpatient Hospital Stay: Payer: Medicare PPO

## 2023-01-01 ENCOUNTER — Inpatient Hospital Stay: Payer: Medicare PPO | Attending: Oncology | Admitting: Oncology

## 2023-01-01 VITALS — BP 94/49 | HR 86 | Temp 98.4°F | Resp 19 | Wt 256.5 lb

## 2023-01-01 DIAGNOSIS — D708 Other neutropenia: Secondary | ICD-10-CM

## 2023-01-01 DIAGNOSIS — D709 Neutropenia, unspecified: Secondary | ICD-10-CM | POA: Insufficient documentation

## 2023-01-01 DIAGNOSIS — E119 Type 2 diabetes mellitus without complications: Secondary | ICD-10-CM | POA: Diagnosis not present

## 2023-01-01 DIAGNOSIS — I10 Essential (primary) hypertension: Secondary | ICD-10-CM | POA: Insufficient documentation

## 2023-01-01 DIAGNOSIS — Z79899 Other long term (current) drug therapy: Secondary | ICD-10-CM | POA: Diagnosis not present

## 2023-01-01 LAB — CBC WITH DIFFERENTIAL (CANCER CENTER ONLY)
Abs Immature Granulocytes: 0.01 10*3/uL (ref 0.00–0.07)
Basophils Absolute: 0.1 10*3/uL (ref 0.0–0.1)
Basophils Relative: 1 %
Eosinophils Absolute: 0.1 10*3/uL (ref 0.0–0.5)
Eosinophils Relative: 2 %
HCT: 38.2 % (ref 36.0–46.0)
Hemoglobin: 13.8 g/dL (ref 12.0–15.0)
Immature Granulocytes: 0 %
Lymphocytes Relative: 55 %
Lymphs Abs: 2.7 10*3/uL (ref 0.7–4.0)
MCH: 28.8 pg (ref 26.0–34.0)
MCHC: 36.1 g/dL — ABNORMAL HIGH (ref 30.0–36.0)
MCV: 79.6 fL — ABNORMAL LOW (ref 80.0–100.0)
Monocytes Absolute: 0.4 10*3/uL (ref 0.1–1.0)
Monocytes Relative: 7 %
Neutro Abs: 1.7 10*3/uL (ref 1.7–7.7)
Neutrophils Relative %: 35 %
Platelet Count: 240 10*3/uL (ref 150–400)
RBC: 4.8 MIL/uL (ref 3.87–5.11)
RDW: 13.7 % (ref 11.5–15.5)
WBC Count: 5 10*3/uL (ref 4.0–10.5)
nRBC: 0 % (ref 0.0–0.2)

## 2023-01-01 LAB — FOLATE: Folate: 33.1 ng/mL (ref >5.9)

## 2023-01-01 LAB — VITAMIN B12: Vitamin B-12: 1026 pg/mL — ABNORMAL HIGH (ref 180–914)

## 2023-01-01 NOTE — Progress Notes (Signed)
[facility] Cancer Initial Visit:  Patient Care Team: Georgina Quint, MD as PCP - General (Internal Medicine) Teryl Lucy, MD as Consulting Physician (Orthopedic Surgery) Charna Elizabeth, MD as Consulting Physician (Gastroenterology)  CHIEF COMPLAINTS/PURPOSE OF CONSULTATION:  HISTORY OF PRESENTING ILLNESS: Katherine [name] 67 y.o. female is here because of  lymphocytosis History notable for allergies, arthritis, diabetes mellitus type 2, hypertension  March 23, 2022: WBC 4.5 hemoglobin 13.9 platelet count 233; 35 segs 55 lymphs 7 monos 2 eos 1 basophil December 01, 2022: WBC 4.5 hemoglobin 14.5 MCV 83 platelet count 248; 35 segs 55 lymphs 7 monos 2 eos 2 basos.  ANC 1.6 ALC 2.5  January 01 2023:  [facility] Hematology Consult No history of recurrent infections.    Social:  Married.  Retired Engineer, site (math).  Tobacco none.  EtOH 2 drinks of wine twice weekly  [facility] Mother died 57 MVA Father died 47 DM Type II, HTN Brother died 84 leukemia Brother died 7 sickle cell disease Brothers x 3 alive with HTN    Review of Systems  Constitutional:  Negative for appetite change, chills, fatigue, fever and unexpected weight change.  HENT:   Negative for mouth sores, nosebleeds, sore throat, trouble swallowing and voice change.   Eyes:  Negative for eye problems and icterus.       Vision changes:  None  Respiratory:  Negative for chest tightness, cough, hemoptysis, shortness of breath and wheezing.        PND:  none Orthopnea:  none DOE:    Cardiovascular:  Negative for chest pain, leg swelling and palpitations.       PND:  none Orthopnea:  none  Gastrointestinal:  Negative for abdominal pain, blood in stool, constipation, diarrhea, nausea and vomiting.  Endocrine: Negative for hot flashes.       Cold intolerance:  none Heat intolerance:  none  Genitourinary:  Negative for bladder incontinence, difficulty urinating, dysuria, frequency, hematuria and nocturia.    Musculoskeletal:  Negative for arthralgias, back pain, gait problem, myalgias, neck pain and neck stiffness.  Skin:  Negative for itching, rash and wound.  Neurological:  Negative for extremity weakness, gait problem, headaches, light-headedness and speech difficulty.       Sometimes has numbness in the 1st and 2nd fingers left hand in the AM  Hematological:  Negative for adenopathy. Does not bruise/bleed easily.  Psychiatric/Behavioral:  Negative for sleep disturbance and suicidal ideas. The patient is not nervous/anxious.     MEDICAL HISTORY: Past Medical History:  Diagnosis Date   Allergy    Arthritis    Diabetes mellitus without complication (HCC)    Hypertension     SURGICAL HISTORY: Past Surgical History:  Procedure Laterality Date   APPENDECTOMY     CHOLECYSTECTOMY     rotator cuff surgery Right 07/2014   TUBAL LIGATION  Dec. 1990    SOCIAL HISTORY: Social History   Socioeconomic History   Marital status: Married    Spouse name: Not on file   Number of children: 2   Years of education: Masters    Highest education level: Not on file  Occupational History   Occupation: Runner, broadcasting/film/video  Tobacco Use   Smoking status: Never   Smokeless tobacco: Never  Vaping Use   Vaping Use: Never used  Substance and Sexual Activity   Alcohol use: Yes    Alcohol/week: 1.0 standard drink of alcohol    Types: 1 Glasses of wine per week    Comment:  Wine about 1 glass or less per bi-weekly   Drug use: No   Sexual activity: Not Currently  Other Topics Concern   Not on file  Social History Narrative   Patient has had 2 pregnancies and has 2 living children.   Social Determinants of Health   Financial Resource Strain: Low Risk  (03/23/2022)   Overall Financial Resource Strain (CARDIA)    Difficulty of Paying Living Expenses: Not hard at all  Food Insecurity: No Food Insecurity (03/23/2022)   Hunger Vital Sign    Worried About Running Out of Food in the Last Year: Never true    Ran  Out of Food in the Last Year: Never true  Transportation Needs: No Transportation Needs (03/23/2022)   PRAPARE - Administrator, Civil Service (Medical): No    Lack of Transportation (Non-Medical): No  Physical Activity: Sufficiently Active (03/23/2022)   Exercise Vital Sign    Days of Exercise per Week: 5 days    Minutes of Exercise per Session: 30 min  Stress: No Stress Concern Present (03/23/2022)   [name] of Occupational Health - Occupational Stress Questionnaire    Feeling of Stress : Not at all  Social Connections: Socially Integrated (03/23/2022)   Social Connection and Isolation Panel [NHANES]    Frequency of Communication with Friends and Family: Not on file    Frequency of Social Gatherings with Friends and Family: More than three times a week    Attends Religious Services: More than 4 times per year    Active Member of [employer] or Organizations: Yes    Attends Engineer, structural: More than 4 times per year    Marital Status: Married  Catering manager Violence: Not At Risk (03/23/2022)   Humiliation, Afraid, Rape, and Kick questionnaire    Fear of Current or Ex-Partner: No    Emotionally Abused: No    Physically Abused: No    Sexually Abused: No    FAMILY HISTORY Family History  Problem Relation Age of Onset   Diabetes Father    Hypertension Father    Arthritis Father    Breast cancer Maternal Aunt        55s   Cancer Maternal Grandmother        breast cancer   Breast cancer Maternal Grandmother    Sickle cell anemia Brother    Hypertension Brother    Cancer Brother 15       Leukemia, passed away at 93   Cancer Maternal Aunt    Cancer Maternal Aunt    Hypertension Brother     ALLERGIES:  is allergic to ace inhibitors, egg yolk, and adhesive [tape].  MEDICATIONS:  Current Outpatient Medications  Medication Sig Dispense Refill   ACCU-CHEK GUIDE test strip TEST BLOOD GLUCOSE FOUR TIMES DAILY AS DIRECTED 400 strip 7   Accu-Chek  Softclix Lancets lancets TEST BLOOD GLUCOSE FOUR TIMES DAILY AS DIRECTED 400 each 7   Alcohol Swabs (DROPSAFE ALCOHOL PREP) 70 % PADS USE FOUR TIMES DAILY AS DIRECTED 400 each 7   amLODipine (NORVASC) 5 MG tablet TAKE 1 TABLET EVERY DAY 90 tablet 3   aspirin EC 81 MG tablet Take 81 mg by mouth daily.     blood glucose meter kit and supplies KIT Dispense based on patient and insurance preference. Use up to four times daily as directed. (FOR ICD-9 250.00, 250.01). 1 each 0   Calcium Carb-Cholecalciferol (CALCIUM 600-D PO) Take by mouth.     Cholecalciferol (VITAMIN  D3 PO) Take by mouth.     metFORMIN (GLUCOPHAGE) 500 MG tablet TAKE 1 TAB 2XDAY WITH MEALS, FOLLOW UP APPOINTMENT DUE IN DECEMBER; MUST SEE PROVIDER FOR REFILLS 180 tablet 3   Multiple Vitamin (MULTIVITAMIN) tablet Take 1 tablet by mouth daily.     Omega-3 Fatty Acids (OMEGA-3 2100 PO) Take by mouth daily.     OVER THE COUNTER MEDICATION 1,950 mg daily.     rosuvastatin (CRESTOR) 10 MG tablet TAKE 1 TABLET EVERY DAY 90 tablet 3   vitamin B-12 (CYANOCOBALAMIN) 1000 MCG tablet Take 1,000 mcg by mouth daily.     No current facility-administered medications for this visit.    PHYSICAL EXAMINATION:  ECOG PERFORMANCE STATUS: 0 - Asymptomatic   Vitals:   01/01/23 1445  BP: (!) 94/49  Pulse: 86  Resp: 19  Temp: 98.4 F (36.9 C)  SpO2: 100%    Filed Weights   01/01/23 1445  Weight: 256 lb 8 oz (116.3 kg)     Physical Exam Vitals and nursing note reviewed.  Constitutional:      General: She is not in acute distress.    Appearance: Normal appearance. She is obese. She is not ill-appearing, toxic-appearing or diaphoretic.  HENT:     Head: Normocephalic and atraumatic.     Right Ear: External ear normal.     Left Ear: External ear normal.     Nose: Nose normal. No congestion or rhinorrhea.  Eyes:     General: No scleral icterus.    Extraocular Movements: Extraocular movements intact.     Conjunctiva/sclera: Conjunctivae  normal.     Pupils: Pupils are equal, round, and reactive to light.  Cardiovascular:     Rate and Rhythm: Normal rate.     Heart sounds: No murmur heard.    No friction rub. No gallop.  Pulmonary:     Effort: Pulmonary effort is normal. No respiratory distress.     Breath sounds: Normal breath sounds. No wheezing.  Abdominal:     General: Bowel sounds are normal.     Palpations: Abdomen is soft.     Tenderness: There is no abdominal tenderness. There is no guarding or rebound.  Musculoskeletal:        General: No swelling, tenderness or deformity.     Cervical back: Normal range of motion and neck supple. No rigidity or tenderness.  Lymphadenopathy:     Head:     Right side of head: No submental, submandibular, tonsillar, preauricular, posterior auricular or occipital adenopathy.     Left side of head: No submental, submandibular, tonsillar, preauricular, posterior auricular or occipital adenopathy.     Cervical: No cervical adenopathy.     Right cervical: No superficial, deep or posterior cervical adenopathy.    Left cervical: No superficial, deep or posterior cervical adenopathy.     Upper Body:     Right upper body: No supraclavicular, axillary, pectoral or epitrochlear adenopathy.     Left upper body: No supraclavicular, axillary, pectoral or epitrochlear adenopathy.  Skin:    General: Skin is warm.     Coloration: Skin is not jaundiced or pale.     Findings: No bruising or erythema.  Neurological:     General: No focal deficit present.     Mental Status: She is alert and oriented to person, place, and time.     Cranial Nerves: No cranial nerve deficit.     Motor: No weakness.  Psychiatric:        Mood  and Affect: Mood normal.        Behavior: Behavior normal.        Thought Content: Thought content normal.        Judgment: Judgment normal.      LABORATORY DATA: I have personally reviewed the data as listed:  Appointment on 01/01/2023  Component Date Value Ref  Range Status   ds DNA Ab 01/01/2023 <1  0 - 9 IU/mL Final   Comment: (NOTE)                                   Negative      <5                                   Equivocal  5 - 9                                   Positive      >9    Ribonucleic Protein 01/01/2023 0.2  0.0 - 0.9 AI Final   ENA SM Ab Ser-aCnc 01/01/2023 <0.2  0.0 - 0.9 AI Final   Scleroderma (Scl-70) (ENA) Antibod* 01/01/2023 <0.2  0.0 - 0.9 AI Final   SSA (Ro) (ENA) Antibody, IgG 01/01/2023 <0.2  0.0 - 0.9 AI Final   SSB (La) (ENA) Antibody, IgG 01/01/2023 <0.2  0.0 - 0.9 AI Final   Chromatin Ab SerPl-aCnc 01/01/2023 <0.2  0.0 - 0.9 AI Final   Anti JO-1 01/01/2023 <0.2  0.0 - 0.9 AI Final   Centromere Ab Screen 01/01/2023 <0.2  0.0 - 0.9 AI Final   See below: 01/01/2023 Comment   Final   Comment: (NOTE) Autoantibody                       Disease Association ------------------------------------------------------------                        Condition                  Frequency ---------------------   ------------------------   --------- Antinuclear Antibody,    SLE, mixed connective Direct (ANA-D)           tissue diseases ---------------------   ------------------------   --------- dsDNA                    SLE                        40 - 60% ---------------------   ------------------------   --------- Chromatin                Drug induced SLE                90%                         SLE                        48 - 97% ---------------------   ------------------------   --------- SSA (Ro)                 SLE  25 - 35%                         Sjogren's Syndrome         40 - 70%                         Neonatal Lupus                 100% ---------------------   ------------------------   --------- SSB (La)                 SLE                                                       10%                         Sjogren's Syndrome              30% ---------------------   -----------------------     --------- Sm (anti-Smith)          SLE                        15 - 30% ---------------------   -----------------------    --------- RNP                      Mixed Connective Tissue                         Disease                         95% (U1 nRNP,                SLE                        30 - 50% anti-ribonucleoprotein)  Polymyositis and/or                         Dermatomyositis                 20% ---------------------   ------------------------   --------- Scl-70 (antiDNA          Scleroderma (diffuse)      20 - 35% topoisomerase)           Crest                           13% ---------------------   ------------------------   --------- Jo-1                     Polymyositis and/or                         Dermatomyositis            20 - 40% ---------------------   ------------------------   --------- Centromere B             Scleroderma -  Crest                         variant                         80% Performed At: [facility] [address] [place], [place] [id number] Jolene SchimkeNagendra Sanjai MD WU:[id number]:[phone]    Rheumatoid fact SerPl-aCnc 01/01/2023 <10.0  <14.0 IU/mL Final   Comment: (NOTE) Performed At: [facility] [place] [address] [place], [place] [id number] Jolene SchimkeNagendra Sanjai MD VH:[id number]:[phone]    Zinc 01/01/2023 92  44 - 115 ug/dL Final   Comment: (NOTE) This test was developed and its performance characteristics determined by [facility]. It has not been cleared or approved by the Food and Drug Administration.                                Detection Limit = 5 Performed At: [facility] [place] [address] [place], [place] [id number] Jolene SchimkeNagendra Sanjai MD UV:[id number]:[phone]    WBC Count 01/01/2023 5.0  4.0 - 10.5 K/uL Final   RBC 01/01/2023 4.80  3.87 - 5.11 MIL/uL Final   Hemoglobin 01/01/2023 13.8  12.0 - 15.0 g/dL Final   HCT 74/25/956304/01/2023 38.2  36.0 - 46.0 % Final   MCV 01/01/2023 79.6 (L)  80.0 - 100.0 fL Final   MCH 01/01/2023 28.8  26.0 -  34.0 pg Final   MCHC 01/01/2023 36.1 (H)  30.0 - 36.0 g/dL Final   RDW 87/56/433204/01/2023 13.7  11.5 - 15.5 % Final   Platelet Count 01/01/2023 240  150 - 400 K/uL Final   nRBC 01/01/2023 0.0  0.0 - 0.2 % Final   Neutrophils Relative % 01/01/2023 35  % Final   Neutro Abs 01/01/2023 1.7  1.7 - 7.7 K/uL Final   Lymphocytes Relative 01/01/2023 55  % Final   Lymphs Abs 01/01/2023 2.7  0.7 - 4.0 K/uL Final   Monocytes Relative 01/01/2023 7  % Final   Monocytes Absolute 01/01/2023 0.4  0.1 - 1.0 K/uL Final   Eosinophils Relative 01/01/2023 2  % Final   Eosinophils Absolute 01/01/2023 0.1  0.0 - 0.5 K/uL Final   Basophils Relative 01/01/2023 1  % Final   Basophils Absolute 01/01/2023 0.1  0.0 - 0.1 K/uL Final   Immature Granulocytes 01/01/2023 0  % Final   Abs Immature Granulocytes 01/01/2023 0.01  0.00 - 0.07 K/uL Final   Performed at [facility], [address] [place], [place] [address]   Copper 01/01/2023 151  80 - 158 ug/dL Final   Comment: (NOTE) This test was developed and its performance characteristics determined by [facility]. It has not been cleared or approved by the Food and Drug Administration.                                Detection Limit = 5 Performed At: [facility] [place] [address] [place], [place] [id number] Jolene SchimkeNagendra Sanjai MD SW:[id number]:[phone]    Vitamin B-12 01/01/2023 1,026 (H)  180 - 914 pg/mL Final   Comment: (NOTE) This assay is not validated for testing neonatal or myeloproliferative syndrome specimens for Vitamin B12 levels. Performed at [facility], [address] [place] [address]    Folate 01/01/2023 33.1  >5.9 ng/mL Final   Comment: RESULT CONFIRMED BY MANUAL  DILUTION Performed at [facility], [address] [place], [place] [address]     RADIOGRAPHIC STUDIES: I have personally reviewed the radiological images as listed and agree with the findings in the report  No results  found.  ASSESSMENT/PLAN 67 y.o. AA female referred for evaluation of lymphocytosis.  History notable for allergies, arthritis, diabetes mellitus type 2, hypertension. A review of previous hemograms demonstrated that the patient has a higher percentage of lymphocytes than granulocytes on the differential.  She does not have an actual lymphocytosis but rather a mild granulocytopenia.  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    Benign ethnic neutropenia:  The term describes the phenotype of having an absolute neutrophil count (ANC) <1500 cells/mL with no increased risk of infection. It is most commonly seen in those of African ancestry. In addition, ANC reference ranges from countries in [place] emphasize that ANC levels <1500 cells/mL are common and harmless. The lower ANC levels are driven by the Duffy null [Fy(a-b-)] phenotype, which is protective againstmalaria and seen in 80% to 100% of those of sub-Saharan African ancestry and <1% of those of European descent. Benign ethnic neutropenia is clinically insignificant, but the average ANC values differ from what are typically seen in those of European descent.   Cancer Staging  No matching staging information was found for the patient.   No problem-specific Assessment & Plan notes found for this encounter.    Orders Placed This Encounter  Procedures   Folate    Standing Status:   Future    Number of Occurrences:   1    Standing Expiration Date:   01/01/2024   Vitamin B12    Standing Status:   Future    Number of Occurrences:   1    Standing Expiration Date:   01/01/2024   Copper, serum    Standing Status:   Future    Number of Occurrences:   1    Standing Expiration Date:   01/01/2024   CBC with Differential (Cancer Center Only)    Standing Status:   Future    Number of Occurrences:   1    Standing Expiration Date:   01/01/2024    Zinc    Standing Status:   Future    Number of Occurrences:   1    Standing Expiration Date:   01/01/2024   Rheumatoid factor    Standing Status:   Future    Number of Occurrences:   1    Standing Expiration Date:   01/01/2024   ANA Comprehensive Panel    Standing Status:   Future    Number of Occurrences:   1    Standing Expiration Date:   01/01/2024    45  minutes was spent in patient care.  This included time spent preparing to see the patient (e.g., review of tests), obtaining and/or reviewing separately obtained history, counseling and educating the patient/family/caregiver, ordering medications, tests, or procedures; documenting clinical information in the electronic or other health record, independently interpreting results and communicating results to the patient/family/caregiver as well as coordination of care.       All questions were answered. The patient knows to call the clinic with any problems, questions or concerns.  This note was electronically signed.    Loni Muse, MD  01/14/2023 12:09 PM

## 2023-01-02 LAB — ANA COMPREHENSIVE PANEL

## 2023-01-03 LAB — RHEUMATOID FACTOR: Rhuematoid fact SerPl-aCnc: <10 IU/mL (ref <14.0)

## 2023-01-04 ENCOUNTER — Telehealth: Payer: Self-pay | Admitting: Oncology

## 2023-01-04 NOTE — Telephone Encounter (Signed)
Called patient per 4/5 los notes to schedule f/u. Left voicemail with new appointment information and contact details if needing to reschedule. 

## 2023-01-05 LAB — ZINC: Zinc: 92 ug/dL (ref 44–115)

## 2023-01-05 LAB — COPPER, SERUM: Copper: 151 ug/dL (ref 80–158)

## 2023-01-14 ENCOUNTER — Other Ambulatory Visit: Payer: Self-pay | Admitting: Oncology

## 2023-01-14 DIAGNOSIS — D708 Other neutropenia: Secondary | ICD-10-CM

## 2023-01-15 ENCOUNTER — Inpatient Hospital Stay: Payer: Medicare PPO | Admitting: Oncology

## 2023-01-15 ENCOUNTER — Inpatient Hospital Stay: Payer: Medicare PPO

## 2023-03-16 ENCOUNTER — Other Ambulatory Visit: Payer: Self-pay | Admitting: Emergency Medicine

## 2023-03-16 DIAGNOSIS — Z1231 Encounter for screening mammogram for malignant neoplasm of breast: Secondary | ICD-10-CM

## 2023-03-31 ENCOUNTER — Other Ambulatory Visit: Payer: Self-pay | Admitting: Emergency Medicine

## 2023-03-31 DIAGNOSIS — R7303 Prediabetes: Secondary | ICD-10-CM

## 2023-03-31 DIAGNOSIS — E1159 Type 2 diabetes mellitus with other circulatory complications: Secondary | ICD-10-CM

## 2023-04-16 ENCOUNTER — Ambulatory Visit
Admission: RE | Admit: 2023-04-16 | Discharge: 2023-04-16 | Disposition: A | Discharge status: Home / Self Care | Payer: Medicare PPO | Source: Ambulatory Visit | Attending: Emergency Medicine | Admitting: Emergency Medicine

## 2023-04-16 DIAGNOSIS — Z1231 Encounter for screening mammogram for malignant neoplasm of breast: Secondary | ICD-10-CM | POA: Diagnosis not present

## 2023-04-30 DIAGNOSIS — E1165 Type 2 diabetes mellitus with hyperglycemia: Secondary | ICD-10-CM | POA: Diagnosis not present

## 2023-04-30 DIAGNOSIS — I10 Essential (primary) hypertension: Secondary | ICD-10-CM | POA: Diagnosis not present

## 2023-05-11 DIAGNOSIS — Z8249 Family history of ischemic heart disease and other diseases of the circulatory system: Secondary | ICD-10-CM | POA: Diagnosis not present

## 2023-05-11 DIAGNOSIS — I10 Essential (primary) hypertension: Secondary | ICD-10-CM | POA: Diagnosis not present

## 2023-05-11 DIAGNOSIS — Z6841 Body Mass Index (BMI) 40.0 and over, adult: Secondary | ICD-10-CM | POA: Diagnosis not present

## 2023-05-11 DIAGNOSIS — Z888 Allergy status to other drugs, medicaments and biological substances status: Secondary | ICD-10-CM | POA: Diagnosis not present

## 2023-05-11 DIAGNOSIS — E119 Type 2 diabetes mellitus without complications: Secondary | ICD-10-CM | POA: Diagnosis not present

## 2023-05-11 DIAGNOSIS — I700 Atherosclerosis of aorta: Secondary | ICD-10-CM | POA: Diagnosis not present

## 2023-05-11 DIAGNOSIS — Z7984 Long term (current) use of oral hypoglycemic drugs: Secondary | ICD-10-CM | POA: Diagnosis not present

## 2023-05-11 DIAGNOSIS — E785 Hyperlipidemia, unspecified: Secondary | ICD-10-CM | POA: Diagnosis not present

## 2023-05-14 ENCOUNTER — Encounter: Payer: Self-pay | Admitting: Family Medicine

## 2023-05-14 ENCOUNTER — Ambulatory Visit: Payer: Medicare PPO | Admitting: Family Medicine

## 2023-05-14 VITALS — BP 142/84 | HR 90 | Temp 97.6°F | Ht 65.0 in | Wt 251.0 lb

## 2023-05-14 DIAGNOSIS — Z7984 Long term (current) use of oral hypoglycemic drugs: Secondary | ICD-10-CM | POA: Diagnosis not present

## 2023-05-14 DIAGNOSIS — I152 Hypertension secondary to endocrine disorders: Secondary | ICD-10-CM | POA: Diagnosis not present

## 2023-05-14 DIAGNOSIS — E1159 Type 2 diabetes mellitus with other circulatory complications: Secondary | ICD-10-CM | POA: Diagnosis not present

## 2023-05-14 NOTE — Patient Instructions (Signed)
Continue monitoring your blood pressure at home until your follow-up appointment with Dr. Alvy Bimler.  Please bring in your machine and your readings to your visit.  Keep up the good work with daily exercise and eating healthy.  Continue your current medications.

## 2023-05-14 NOTE — Progress Notes (Signed)
Subjective:     Patient ID: Katherine [name], female    DOB: 06/20/1956, 67 y.o.   MRN: [id number]  Chief Complaint  Patient presents with   Hypertension    Ranging [phone] bottom number [phone].    Back Pain    Right side back pain randomly for 3 months, feels like she strained her back     Hypertension Pertinent negatives include no chest pain, headaches, malaise/fatigue, palpitations or shortness of breath.  Back Pain Pertinent negatives include no abdominal pain, chest pain, dysuria, fever, headaches, tingling or weakness.    Discussed the use of AI scribe software for clinical note transcription with the patient, who gave verbal consent to proceed.  History of Present Illness         Here with concerns of elevated BP for the past 2 weeks. She has a new BP machine.  Taking amlodipine 5 mg daily.  Eating a low sodium diet.  Walks for 30 minutes daily.   Hx of itching with lisinopril.    Health Maintenance Due  Topic Date Due   FOOT EXAM  11/26/2021   COVID-19 Vaccine (7 - 2023-24 season) 05/29/2022   Diabetic kidney evaluation - Urine ACR  03/24/2023   Medicare Annual Wellness (AWV)  03/24/2023   INFLUENZA VACCINE  04/29/2023    Past Medical History:  Diagnosis Date   Allergy    Arthritis    Diabetes mellitus without complication (HCC)    Hypertension     Past Surgical History:  Procedure Laterality Date   APPENDECTOMY     CHOLECYSTECTOMY     rotator cuff surgery Right 07/2014   TUBAL LIGATION  Dec. 1990    Family History  Problem Relation Age of Onset   Diabetes Father    Hypertension Father    Arthritis Father    Breast cancer Maternal Aunt        60s   Cancer Maternal Grandmother        breast cancer   Breast cancer Maternal Grandmother    Sickle cell anemia Brother    Hypertension Brother    Cancer Brother 15       Leukemia, passed away at 39   Cancer Maternal Aunt    Cancer Maternal Aunt    Hypertension Brother     Social  History   Socioeconomic History   Marital status: Married    Spouse name: Not on file   Number of children: 2   Years of education: Masters    Highest education level: Not on file  Occupational History   Occupation: Runner, broadcasting/film/video  Tobacco Use   Smoking status: Never   Smokeless tobacco: Never  Vaping Use   Vaping status: Never Used  Substance and Sexual Activity   Alcohol use: Yes    Alcohol/week: 1.0 standard drink of alcohol    Types: 1 Glasses of wine per week    Comment: Wine about 1 glass or less per bi-weekly   Drug use: No   Sexual activity: Not Currently  Other Topics Concern   Not on file  Social History Narrative   Patient has had 2 pregnancies and has 2 living children.   Social Determinants of Health   Financial Resource Strain: Low Risk  (03/23/2022)   Overall Financial Resource Strain (CARDIA)    Difficulty of Paying Living Expenses: Not hard at all  Food Insecurity: No Food Insecurity (03/23/2022)   Hunger Vital Sign    Worried About Running Out of Food in  the Last Year: Never true    Ran Out of Food in the Last Year: Never true  Transportation Needs: No Transportation Needs (03/23/2022)   PRAPARE - Administrator, Civil Service (Medical): No    Lack of Transportation (Non-Medical): No  Physical Activity: Sufficiently Active (03/23/2022)   Exercise Vital Sign    Days of Exercise per Week: 5 days    Minutes of Exercise per Session: 30 min  Stress: No Stress Concern Present (03/23/2022)   [name] of Occupational Health - Occupational Stress Questionnaire    Feeling of Stress : Not at all  Social Connections: Socially Integrated (03/23/2022)   Social Connection and Isolation Panel [NHANES]    Frequency of Communication with Friends and Family: Not on file    Frequency of Social Gatherings with Friends and Family: More than three times a week    Attends Religious Services: More than 4 times per year    Active Member of [employer] or Organizations:  Yes    Attends Banker Meetings: More than 4 times per year    Marital Status: Married  Catering manager Violence: Not At Risk (03/23/2022)   Humiliation, Afraid, Rape, and Kick questionnaire    Fear of Current or Ex-Partner: No    Emotionally Abused: No    Physically Abused: No    Sexually Abused: No    Outpatient Medications Prior to Visit  Medication Sig Dispense Refill   ACCU-CHEK GUIDE test strip TEST BLOOD GLUCOSE FOUR TIMES DAILY AS DIRECTED 400 strip 3   Accu-Chek Softclix Lancets lancets TEST BLOOD GLUCOSE FOUR TIMES DAILY AS DIRECTED 400 each 3   Alcohol Swabs (DROPSAFE ALCOHOL PREP) 70 % PADS USE AS DIRECTED FOUR TIMES DAILY 400 each 3   amLODipine (NORVASC) 5 MG tablet TAKE 1 TABLET EVERY DAY 90 tablet 3   aspirin EC 81 MG tablet Take 81 mg by mouth daily.     blood glucose meter kit and supplies KIT Dispense based on patient and insurance preference. Use up to four times daily as directed. (FOR ICD-9 250.00, 250.01). 1 each 0   Calcium Carb-Cholecalciferol (CALCIUM 600-D PO) Take by mouth.     Cholecalciferol (VITAMIN D3 PO) Take by mouth.     metFORMIN (GLUCOPHAGE) 500 MG tablet TAKE 1 TAB 2XDAY WITH MEALS, FOLLOW UP APPOINTMENT DUE IN DECEMBER; MUST SEE PROVIDER FOR REFILLS 180 tablet 3   Multiple Vitamin (MULTIVITAMIN) tablet Take 1 tablet by mouth daily.     Omega-3 Fatty Acids (OMEGA-3 2100 PO) Take by mouth daily.     OVER THE COUNTER MEDICATION 1,950 mg daily.     rosuvastatin (CRESTOR) 10 MG tablet TAKE 1 TABLET EVERY DAY 90 tablet 3   vitamin B-12 (CYANOCOBALAMIN) 1000 MCG tablet Take 1,000 mcg by mouth daily.     No facility-administered medications prior to visit.    Allergies  Allergen Reactions   Ace Inhibitors Cough   Egg Yolk Other (See Comments)    vomiting   Adhesive [Tape] Rash    Review of Systems  Constitutional:  Negative for chills, fever and malaise/fatigue.  Respiratory:  Negative for shortness of breath.   Cardiovascular:   Negative for chest pain, palpitations and leg swelling.  Gastrointestinal:  Negative for abdominal pain, constipation, diarrhea, nausea and vomiting.  Genitourinary:  Negative for dysuria, frequency and urgency.  Musculoskeletal:  Positive for back pain.  Neurological:  Negative for dizziness, tingling, speech change, weakness and headaches.  Objective:    Physical Exam Constitutional:      General: She is not in acute distress.    Appearance: She is not ill-appearing.  Eyes:     Extraocular Movements: Extraocular movements intact.     Conjunctiva/sclera: Conjunctivae normal.  Cardiovascular:     Rate and Rhythm: Normal rate and regular rhythm.  Pulmonary:     Effort: Pulmonary effort is normal.     Breath sounds: Normal breath sounds.  Musculoskeletal:     Cervical back: Normal range of motion and neck supple.     Right lower leg: No edema.     Left lower leg: No edema.  Skin:    General: Skin is warm and dry.  Neurological:     General: No focal deficit present.     Mental Status: She is alert and oriented to person, place, and time.  Psychiatric:        Mood and Affect: Mood normal.        Behavior: Behavior normal.        Thought Content: Thought content normal.      BP (!) 142/84 (BP Location: Left Arm, Patient Position: Sitting, Cuff Size: Large)   Pulse 90   Temp 97.6 F (36.4 C) (Temporal)   Ht 5\' 5"  (1.651 m)   Wt 251 lb (113.9 kg)   SpO2 98%   BMI 41.77 kg/m  Wt Readings from Last 3 Encounters:  05/14/23 251 lb (113.9 kg)  01/01/23 256 lb 8 oz (116.3 kg)  12/01/22 248 lb 6 oz (112.7 kg)       Assessment & Plan:   Problem List Items Addressed This Visit       Cardiovascular and Mediastinum   Hypertension associated with diabetes (HCC) - Primary   Here with concerns regarding her blood pressure being elevated over the past 2 weeks.  She recently got a new blood pressure cuff which is reading higher than our blood pressure cuff in the office.   She will monitor her blood pressure at home and keep a log.  She will bring these into her follow-up appointment with her PCP.  We discussed a low-sodium diet and continuing daily exercise.  Continue amlodipine 5 mg for now.  I am having Katherine [name] maintain her multivitamin, aspirin EC, cyanocobalamin, Calcium Carb-Cholecalciferol (CALCIUM 600-D PO), blood glucose meter kit and supplies, Cholecalciferol (VITAMIN D3 PO), Omega-3 Fatty Acids (OMEGA-3 2100 PO), OVER THE COUNTER MEDICATION, rosuvastatin, amLODipine, metFORMIN, Accu-Chek Softclix Lancets, Accu-Chek Guide, and DropSafe Alcohol Prep.  No orders of the defined types were placed in this encounter.

## 2023-06-01 ENCOUNTER — Encounter: Payer: Self-pay | Admitting: Pharmacist

## 2023-06-04 DIAGNOSIS — E1165 Type 2 diabetes mellitus with hyperglycemia: Secondary | ICD-10-CM | POA: Diagnosis not present

## 2023-06-04 DIAGNOSIS — I10 Essential (primary) hypertension: Secondary | ICD-10-CM | POA: Diagnosis not present

## 2023-06-07 ENCOUNTER — Encounter: Payer: Self-pay | Admitting: Emergency Medicine

## 2023-06-07 ENCOUNTER — Ambulatory Visit: Payer: Medicare PPO | Admitting: Emergency Medicine

## 2023-06-07 ENCOUNTER — Telehealth: Payer: Self-pay | Admitting: Emergency Medicine

## 2023-06-07 VITALS — BP 146/84 | HR 73 | Temp 97.7°F | Wt 251.2 lb

## 2023-06-07 DIAGNOSIS — Z6841 Body Mass Index (BMI) 40.0 and over, adult: Secondary | ICD-10-CM | POA: Diagnosis not present

## 2023-06-07 DIAGNOSIS — Z889 Allergy status to unspecified drugs, medicaments and biological substances status: Secondary | ICD-10-CM

## 2023-06-07 DIAGNOSIS — I700 Atherosclerosis of aorta: Secondary | ICD-10-CM

## 2023-06-07 DIAGNOSIS — E1159 Type 2 diabetes mellitus with other circulatory complications: Secondary | ICD-10-CM | POA: Diagnosis not present

## 2023-06-07 DIAGNOSIS — Z7984 Long term (current) use of oral hypoglycemic drugs: Secondary | ICD-10-CM | POA: Diagnosis not present

## 2023-06-07 DIAGNOSIS — I152 Hypertension secondary to endocrine disorders: Secondary | ICD-10-CM

## 2023-06-07 DIAGNOSIS — E1169 Type 2 diabetes mellitus with other specified complication: Secondary | ICD-10-CM | POA: Diagnosis not present

## 2023-06-07 DIAGNOSIS — E669 Obesity, unspecified: Secondary | ICD-10-CM | POA: Diagnosis not present

## 2023-06-07 LAB — CBC WITH DIFFERENTIAL/PLATELET
Basophils Absolute: 0 10*3/uL (ref 0.0–0.1)
Basophils Relative: 0.9 % (ref 0.0–3.0)
Eosinophils Absolute: 0.2 10*3/uL (ref 0.0–0.7)
Eosinophils Relative: 4 % (ref 0.0–5.0)
HCT: 42.2 % (ref 36.0–46.0)
Hemoglobin: 14.1 g/dL (ref 12.0–15.0)
Lymphocytes Relative: 59.1 % — ABNORMAL HIGH (ref 12.0–46.0)
Lymphs Abs: 2.6 10*3/uL (ref 0.7–4.0)
MCHC: 33.5 g/dL (ref 30.0–36.0)
MCV: 83.3 fl (ref 78.0–100.0)
Monocytes Absolute: 0.3 10*3/uL (ref 0.1–1.0)
Monocytes Relative: 7 % (ref 3.0–12.0)
Neutro Abs: 1.3 10*3/uL — ABNORMAL LOW (ref 1.4–7.7)
Neutrophils Relative %: 29 % — ABNORMAL LOW (ref 43.0–77.0)
Platelets: 220 10*3/uL (ref 150.0–400.0)
RBC: 5.06 Mil/uL (ref 3.87–5.11)
RDW: 14.3 % (ref 11.5–15.5)
WBC: 4.5 10*3/uL (ref 4.0–10.5)

## 2023-06-07 LAB — COMPREHENSIVE METABOLIC PANEL
ALT: 18 U/L (ref 0–35)
AST: 17 U/L (ref 0–37)
Albumin: 4.1 g/dL (ref 3.5–5.2)
Alkaline Phosphatase: 81 U/L (ref 39–117)
BUN: 15 mg/dL (ref 6–23)
CO2: 31 meq/L (ref 19–32)
Calcium: 9.6 mg/dL (ref 8.4–10.5)
Chloride: 103 meq/L (ref 96–112)
Creatinine, Ser: 0.81 mg/dL (ref 0.40–1.20)
GFR: 74.21 mL/min (ref >60.00)
Glucose, Bld: 126 mg/dL — ABNORMAL HIGH (ref 70–99)
Potassium: 3.7 meq/L (ref 3.5–5.1)
Sodium: 140 meq/L (ref 135–145)
Total Bilirubin: 0.6 mg/dL (ref 0.2–1.2)
Total Protein: 8 g/dL (ref 6.0–8.3)

## 2023-06-07 LAB — HEMOGLOBIN A1C: Hgb A1c MFr Bld: 6.8 % — ABNORMAL HIGH (ref 4.6–6.5)

## 2023-06-07 LAB — MICROALBUMIN / CREATININE URINE RATIO
Creatinine,U: 92.1 mg/dL
Microalb Creat Ratio: 1.2 mg/g (ref 0.0–30.0)
Microalb, Ur: 1.1 mg/dL (ref 0.0–1.9)

## 2023-06-07 LAB — LIPID PANEL
Cholesterol: 109 mg/dL (ref 0–200)
HDL: 49.1 mg/dL (ref >39.00)
LDL Cholesterol: 36 mg/dL (ref 0–99)
NonHDL: 59.6
Total CHOL/HDL Ratio: 2
Triglycerides: 120 mg/dL (ref 0.0–149.0)
VLDL: 24 mg/dL (ref 0.0–40.0)

## 2023-06-07 MED ORDER — LOSARTAN POTASSIUM 25 MG PO TABS
25.0000 mg | ORAL_TABLET | Freq: Every day | ORAL | 3 refills | Status: DC
Start: 2023-06-07 — End: 2023-07-19

## 2023-06-07 NOTE — Progress Notes (Signed)
Katherine [name] 67 y.o.   Chief Complaint  Patient presents with   [name]    Follow up     HISTORY OF PRESENT ILLNESS: This is a 67 y.o. female here for 24-month follow-up of chronic medical conditions including diabetes and hypertension Overall doing well.  Has no complaints or medical concerns today. History of multiple allergies.  Requesting referral. BP Readings from Last 3 Encounters:  06/07/23 (!) 150/86  05/14/23 (!) 142/84  01/01/23 (!) 94/49   Wt Readings from Last 3 Encounters:  06/07/23 251 lb 3.2 oz (113.9 kg)  05/14/23 251 lb (113.9 kg)  01/01/23 256 lb 8 oz (116.3 kg)     HPI   Prior to Admission medications   Medication Sig Start Date End Date Taking? Authorizing Provider  ACCU-CHEK GUIDE test strip TEST BLOOD GLUCOSE FOUR TIMES DAILY AS DIRECTED 03/31/23  Yes Abed Schar, Eilleen Kempf, MD  Accu-Chek Softclix Lancets lancets TEST BLOOD GLUCOSE FOUR TIMES DAILY AS DIRECTED 03/31/23  Yes Georgina Quint, MD  Alcohol Swabs (DROPSAFE ALCOHOL PREP) 70 % PADS USE AS DIRECTED FOUR TIMES DAILY 03/31/23  Yes Georgina Quint, MD  amLODipine (NORVASC) 5 MG tablet TAKE 1 TABLET EVERY DAY 10/28/22  Yes Georgina Quint, MD  aspirin EC 81 MG tablet Take 81 mg by mouth daily.   Yes [provider]  blood glucose meter kit and supplies KIT Dispense based on patient and insurance preference. Use up to four times daily as directed. (FOR ICD-9 250.00, 250.01). 06/20/18  Yes Vanesha Athens, Eilleen Kempf, MD  Calcium Carb-Cholecalciferol (CALCIUM 600-D PO) Take by mouth.   Yes [provider]  Cholecalciferol (VITAMIN D3 PO) Take by mouth.   Yes [provider]  metFORMIN (GLUCOPHAGE) 500 MG tablet TAKE 1 TAB 2XDAY WITH MEALS, FOLLOW UP APPOINTMENT DUE IN DECEMBER; MUST SEE PROVIDER FOR REFILLS 11/04/22  Yes Hezakiah Champeau, Eilleen Kempf, MD  Multiple Vitamin (MULTIVITAMIN) tablet Take 1 tablet by mouth daily.   Yes [provider]  Omega-3 Fatty Acids  (OMEGA-3 2100 PO) Take by mouth daily.   Yes [provider]  OVER THE COUNTER MEDICATION 1,950 mg daily.   Yes [provider]  rosuvastatin (CRESTOR) 10 MG tablet TAKE 1 TABLET EVERY DAY 10/28/22  Yes Brycelynn Stampley, Eilleen Kempf, MD  vitamin B-12 (CYANOCOBALAMIN) 1000 MCG tablet Take 1,000 mcg by mouth daily.   Yes [provider]    Allergies  Allergen Reactions   Ace Inhibitors Cough   Egg Yolk Other (See Comments)    vomiting   Adhesive [Tape] Rash    Patient Active Problem List   Diagnosis Date Noted   Benign ethnic neutropenia (HCC) 01/01/2023   Chronic insomnia 12/01/2022   Aortic atherosclerosis (HCC) 11/26/2020   Diverticulosis 11/26/2020   Obesity, diabetes, and hypertension syndrome (HCC) 06/05/2020   Body mass index (BMI) of 40.1-44.9 in adult (HCC) 12/04/2019   Class 2 severe obesity due to excess calories with serious comorbidity and body mass index (BMI) of 35.0 to 35.9 in adult [facility]) 10/19/2019   Osteoarthritis of spine with radiculopathy, cervical region 11/30/2017   Arthritis 06/23/2014   Prediabetes 10/20/2013   Obesity (BMI 35.0-39.9 without comorbidity) 10/20/2013   Hypertension associated with diabetes [facility])     Past Medical History:  Diagnosis Date   Allergy    Arthritis    Diabetes mellitus without complication (HCC)    Hypertension     Past Surgical History:  Procedure Laterality Date   APPENDECTOMY  CHOLECYSTECTOMY     rotator cuff surgery Right 07/2014   TUBAL LIGATION  Dec. 1990    Social History   Socioeconomic History   Marital status: Married    Spouse name: Not on file   Number of children: 2   Years of education: Masters    Highest education level: Master's degree (e.g., MA, MS, MEng, MEd, MSW, MBA)  Occupational History   Occupation: Teacher  Tobacco Use   Smoking status: Never   Smokeless tobacco: Never  Vaping Use   Vaping status: Never Used  Substance and Sexual Activity   Alcohol use: Yes     Alcohol/week: 1.0 standard drink of alcohol    Types: 1 Glasses of wine per week    Comment: Wine about 1 glass or less per bi-weekly   Drug use: No   Sexual activity: Not Currently  Other Topics Concern   Not on file  Social History Narrative   Patient has had 2 pregnancies and has 2 living children.   Social Determinants of Health   Financial Resource Strain: Low Risk  (06/06/2023)   Overall Financial Resource Strain (CARDIA)    Difficulty of Paying Living Expenses: Not very hard  Food Insecurity: No Food Insecurity (06/06/2023)   Hunger Vital Sign    Worried About Running Out of Food in the Last Year: Never true    Ran Out of Food in the Last Year: Never true  Transportation Needs: No Transportation Needs (06/06/2023)   PRAPARE - Administrator, Civil Service (Medical): No    Lack of Transportation (Non-Medical): No  Physical Activity: Insufficiently Active (06/06/2023)   Exercise Vital Sign    Days of Exercise per Week: 4 days    Minutes of Exercise per Session: 30 min  Stress: No Stress Concern Present (06/06/2023)   [name] of Occupational Health - Occupational Stress Questionnaire    Feeling of Stress : Not at all  Social Connections: Socially Integrated (06/06/2023)   Social Connection and Isolation Panel [NHANES]    Frequency of Communication with Friends and Family: More than three times a week    Frequency of Social Gatherings with Friends and Family: Three times a week    Attends Religious Services: More than 4 times per year    Active Member of Clubs or Organizations: Yes    Attends Banker Meetings: More than 4 times per year    Marital Status: Married  Catering manager Violence: Not At Risk (03/23/2022)   Humiliation, Afraid, Rape, and Kick questionnaire    Fear of Current or Ex-Partner: No    Emotionally Abused: No    Physically Abused: No    Sexually Abused: No    Family History  Problem Relation Age of Onset   Diabetes Father     Hypertension Father    Arthritis Father    Breast cancer Maternal Aunt        45s   Cancer Maternal Grandmother        breast cancer   Breast cancer Maternal Grandmother    Sickle cell anemia Brother    Hypertension Brother    Cancer Brother 15       Leukemia, passed away at 2   Cancer Maternal Aunt    Cancer Maternal Aunt    Hypertension Brother      Review of Systems  Constitutional: Negative.  Negative for chills and fever.  HENT: Negative.  Negative for congestion and sore throat.   Respiratory: Negative.  Negative for cough and shortness of breath.   Cardiovascular: Negative.  Negative for chest pain and palpitations.  Gastrointestinal:  Negative for abdominal pain, diarrhea, nausea and vomiting.  Skin: Negative.  Negative for rash.  Neurological: Negative.  Negative for dizziness and headaches.  Endo/Heme/Allergies:  Positive for environmental allergies.  All other systems reviewed and are negative.   Vitals:   06/07/23 0950  BP: (!) 150/86  Pulse: 73  Temp: 97.7 F (36.5 C)  SpO2: 96%    Physical Exam Vitals reviewed.  Constitutional:      Appearance: Normal appearance. She is obese.  HENT:     Head: Normocephalic.     Mouth/Throat:     Mouth: Mucous membranes are moist.     Pharynx: Oropharynx is clear.  Eyes:     Extraocular Movements: Extraocular movements intact.     Conjunctiva/sclera: Conjunctivae normal.     Pupils: Pupils are equal, round, and reactive to light.  Cardiovascular:     Rate and Rhythm: Normal rate and regular rhythm.     Pulses: Normal pulses.     Heart sounds: Normal heart sounds.  Pulmonary:     Effort: Pulmonary effort is normal.     Breath sounds: Normal breath sounds.  Musculoskeletal:     Cervical back: No tenderness.  Lymphadenopathy:     Cervical: No cervical adenopathy.  Skin:    General: Skin is warm and dry.     Capillary Refill: Capillary refill takes less than 2 seconds.  Neurological:     General: No focal  deficit present.     Mental Status: She is alert and oriented to person, place, and time.  Psychiatric:        Mood and Affect: Mood normal.        Behavior: Behavior normal.      ASSESSMENT & PLAN: A total of 43 minutes was spent with the patient and counseling/coordination of care regarding preparing for this visit, review of most recent office visit notes, review of multiple chronic medical conditions under management, review of all medications and changes made, cardiovascular risks associated with uncontrolled hypertension, education on nutrition, review of health maintenance items, review of most recent blood work results, prognosis, documentation, and need for follow-up.  Problem List Items Addressed This Visit       Cardiovascular and Mediastinum   Hypertension associated with diabetes (HCC) - Primary    Elevated blood pressure reading in the office today Continue amlodipine 5 mg daily Recommend to start losartan 25 mg daily Advised to monitor blood pressure readings at home daily for the next several weeks and keep a log.  Advised to contact the office if numbers persistently abnormal. Well-controlled diabetes. Hemoglobin A1c done today Continue metformin 500 mg twice a day Cardiovascular risk associated with hypertension and diabetes discussed Diet and nutrition discussed Blood work and urine done today. [name] in 6 months      Relevant Medications   losartan (COZAAR) 25 MG tablet   Other Relevant Orders   Urine Microalbumin w/creat. ratio   CBC with Differential/Platelet   Comprehensive metabolic panel   Hemoglobin A1c   Lipid panel   Obesity, diabetes, and hypertension syndrome (HCC)    Cardiovascular risk associated with diabetes and hypertension discussed Diet and nutrition discussed Advised to decrease amount of daily carbohydrate intake and daily calories and increase amount of plant based protein in her diet Blood work done today Continue metformin 500 mg  twice a day Continue amlodipine 5 mg  daily Start losartan 25 mg daily      Relevant Medications   losartan (COZAAR) 25 MG tablet   Other Relevant Orders   Urine Microalbumin w/creat. ratio   CBC with Differential/Platelet   Comprehensive metabolic panel   Hemoglobin A1c   Lipid panel   Aortic atherosclerosis (HCC)    Diet and nutrition discussed Continue rosuvastatin 10 mg daily      Relevant Medications   losartan (COZAAR) 25 MG tablet     Other   Body mass index (BMI) of 40.1-44.9 in adult [facility])    Diet and nutrition discussed GLP-1 agonists options discussed Advised to decrease amount of daily carbohydrate intake and daily calories and increase amount of plant-based protein in her diet Benefits of exercise discussed      Other Visit Diagnoses     Multiple allergies       Relevant Orders   Ambulatory referral to Allergy        Patient Instructions  Diabetes Mellitus and Nutrition, Adult When you have diabetes, or diabetes mellitus, it is very important to have healthy eating habits because your blood sugar (glucose) levels are greatly affected by what you eat and drink. Eating healthy foods in the right amounts, at about the same times every day, can help you: Manage your blood glucose. Lower your risk of heart disease. Improve your blood pressure. Reach or maintain a healthy weight. What can affect my meal plan? Every person with diabetes is different, and each person has different needs for a meal plan. Your health care provider may recommend that you work with a dietitian to make a meal plan that is best for you. Your meal plan may vary depending on factors such as: The calories you need. The medicines you take. Your weight. Your blood glucose, blood pressure, and cholesterol levels. Your activity level. Other health conditions you have, such as heart or kidney disease. How do carbohydrates affect me? Carbohydrates, also called carbs, affect your blood  glucose level more than any other type of food. Eating carbs raises the amount of glucose in your blood. It is important to know how many carbs you can safely have in each meal. This is different for every person. Your dietitian can help you calculate how many carbs you should have at each meal and for each snack. How does alcohol affect me? Alcohol can cause a decrease in blood glucose (hypoglycemia), especially if you use insulin or take certain diabetes medicines by mouth. Hypoglycemia can be a life-threatening condition. Symptoms of hypoglycemia, such as sleepiness, dizziness, and confusion, are similar to symptoms of having too much alcohol. Do not drink alcohol if: Your health care provider tells you not to drink. You are pregnant, may be pregnant, or are planning to become pregnant. If you drink alcohol: Limit how much you have to: 0-1 drink a day for women. 0-2 drinks a day for men. Know how much alcohol is in your drink. In the [place] one drink equals one 12 oz bottle of beer (355 mL), one 5 oz glass of wine (148 mL), or one 1 oz glass of hard liquor (44 mL). Keep yourself hydrated with water, diet soda, or unsweetened iced tea. Keep in mind that regular soda, juice, and other mixers may contain a lot of sugar and must be counted as carbs. What are tips for following this plan?  Reading food labels Start by checking the serving size on the Nutrition Facts label of packaged foods and drinks. The number  of calories and the amount of carbs, fats, and other nutrients listed on the label are based on one serving of the item. Many items contain more than one serving per package. Check the total grams (g) of carbs in one serving. Check the number of grams of saturated fats and trans fats in one serving. Choose foods that have a low amount or none of these fats. Check the number of milligrams (mg) of salt (sodium) in one serving. Most people should limit total sodium intake to less than 2,300 mg  per day. Always check the nutrition information of foods labeled as "low-fat" or "nonfat." These foods may be higher in added sugar or refined carbs and should be avoided. Talk to your dietitian to identify your daily goals for nutrients listed on the label. Shopping Avoid buying canned, pre-made, or processed foods. These foods tend to be high in fat, sodium, and added sugar. Shop around the outside edge of the grocery store. This is where you will most often find fresh fruits and vegetables, bulk grains, fresh meats, and fresh dairy products. Cooking Use low-heat cooking methods, such as baking, instead of high-heat cooking methods, such as deep frying. Cook using healthy oils, such as olive, canola, or sunflower oil. Avoid cooking with butter, cream, or high-fat meats. Meal planning Eat meals and snacks regularly, preferably at the same times every day. Avoid going long periods of time without eating. Eat foods that are high in fiber, such as fresh fruits, vegetables, beans, and whole grains. Eat 4-6 oz (112-168 g) of lean protein each day, such as lean meat, chicken, fish, eggs, or tofu. One ounce (oz) (28 g) of lean protein is equal to: 1 oz (28 g) of meat, chicken, or fish. 1 egg.  cup (62 g) of tofu. Eat some foods each day that contain healthy fats, such as avocado, nuts, seeds, and fish. What foods should I eat? Fruits Berries. Apples. Oranges. Peaches. Apricots. Plums. Grapes. Mangoes. Papayas. Pomegranates. Kiwi. Cherries. Vegetables Leafy greens, including lettuce, spinach, kale, chard, collard greens, mustard greens, and cabbage. Beets. Cauliflower. Broccoli. Carrots. Green beans. Tomatoes. Peppers. Onions. Cucumbers. Brussels sprouts. Grains Whole grains, such as whole-wheat or whole-grain bread, crackers, tortillas, cereal, and pasta. Unsweetened oatmeal. Quinoa. Brown or wild rice. Meats and other proteins Seafood. Poultry without skin. Lean cuts of poultry and beef. Tofu.  Nuts. Seeds. Dairy Low-fat or fat-free dairy products such as milk, yogurt, and cheese. The items listed above may not be a complete list of foods and beverages you can eat and drink. Contact a dietitian for more information. What foods should I avoid? Fruits Fruits canned with syrup. Vegetables Canned vegetables. Frozen vegetables with butter or cream sauce. Grains Refined white flour and flour products such as bread, pasta, snack foods, and cereals. Avoid all processed foods. Meats and other proteins Fatty cuts of meat. Poultry with skin. Breaded or fried meats. Processed meat. Avoid saturated fats. Dairy Full-fat yogurt, cheese, or milk. Beverages Sweetened drinks, such as soda or iced tea. The items listed above may not be a complete list of foods and beverages you should avoid. Contact a dietitian for more information. Questions to ask a health care provider Do I need to meet with a certified diabetes care and education specialist? Do I need to meet with a dietitian? What number can I call if I have questions? When are the best times to check my blood glucose? Where to find more information: American Diabetes Association: diabetes.org Academy of Nutrition and  Dietetics: eatright.[employer] of Diabetes and Digestive and Kidney Diseases: StageSync.si Association of Diabetes Care & Education Specialists: diabeteseducator.org Summary It is important to have healthy eating habits because your blood sugar (glucose) levels are greatly affected by what you eat and drink. It is important to use alcohol carefully. A healthy meal plan will help you manage your blood glucose and lower your risk of heart disease. Your health care provider may recommend that you work with a dietitian to make a meal plan that is best for you. This information is not intended to replace advice given to you by your health care provider. Make sure you discuss any questions you have with your health  care provider. Document Revised: 04/17/2020 Document Reviewed: 04/17/2020 Elsevier Patient Education  2024 Elsevier Inc.     Edwina Barth, MD [facility]

## 2023-06-07 NOTE — Assessment & Plan Note (Signed)
Cardiovascular risk associated with diabetes and hypertension discussed Diet and nutrition discussed Advised to decrease amount of daily carbohydrate intake and daily calories and increase amount of plant based protein in her diet Blood work done today Continue metformin 500 mg twice a day Continue amlodipine 5 mg daily Start losartan 25 mg daily

## 2023-06-07 NOTE — Assessment & Plan Note (Signed)
Diet and nutrition discussed GLP-1 agonists options discussed Advised to decrease amount of daily carbohydrate intake and daily calories and increase amount of plant-based protein in her diet Benefits of exercise discussed

## 2023-06-07 NOTE — Telephone Encounter (Signed)
Error message

## 2023-06-07 NOTE — Assessment & Plan Note (Signed)
Diet and nutrition discussed. Continue rosuvastatin 10 mg daily.  

## 2023-06-07 NOTE — Assessment & Plan Note (Addendum)
Elevated blood pressure reading in the office today Continue amlodipine 5 mg daily Recommend to start losartan 25 mg daily Advised to monitor blood pressure readings at home daily for the next several weeks and keep a log.  Advised to contact the office if numbers persistently abnormal. Well-controlled diabetes. Hemoglobin A1c done today Continue metformin 500 mg twice a day Cardiovascular risk associated with hypertension and diabetes discussed Diet and nutrition discussed Blood work and urine done today. [name] in 6 months

## 2023-06-07 NOTE — Patient Instructions (Signed)

## 2023-06-25 DIAGNOSIS — I10 Essential (primary) hypertension: Secondary | ICD-10-CM | POA: Diagnosis not present

## 2023-06-25 DIAGNOSIS — E1165 Type 2 diabetes mellitus with hyperglycemia: Secondary | ICD-10-CM | POA: Diagnosis not present

## 2023-06-28 DIAGNOSIS — E1165 Type 2 diabetes mellitus with hyperglycemia: Secondary | ICD-10-CM | POA: Diagnosis not present

## 2023-06-28 DIAGNOSIS — I10 Essential (primary) hypertension: Secondary | ICD-10-CM | POA: Diagnosis not present

## 2023-07-18 NOTE — Progress Notes (Unsigned)
New Patient Note  RE: Katherine [name] MRN: [id number] DOB: 1956/08/22 Date of Office Visit: 07/19/2023  Consult requested by: Georgina Quint, * Primary care provider: Georgina Quint, MD  Chief Complaint: No chief complaint on file.  History of Present Illness: I had the pleasure of seeing Katherine [name] for initial evaluation at the [facility] on 07/18/2023. She is a 67 y.o. female, who is referred here by Georgina Quint, MD for the evaluation of ***.  Discussed the use of AI scribe software for clinical note transcription with the patient, who gave verbal consent to proceed.  History of Present Illness             ***  Assessment and Plan: Katherine [name] is a 67 y.o. female with: ***  Assessment and Plan               No follow-ups on file.  No orders of the defined types were placed in this encounter.  Lab Orders  No laboratory test(s) ordered today    Other allergy screening: Asthma: {Blank single:19197::"yes","no"} Rhino conjunctivitis: {Blank single:19197::"yes","no"} Food allergy: {Blank single:19197::"yes","no"} Medication allergy: {Blank single:19197::"yes","no"} Hymenoptera allergy: {Blank single:19197::"yes","no"} Urticaria: {Blank single:19197::"yes","no"} Eczema:{Blank single:19197::"yes","no"} History of recurrent infections suggestive of immunodeficency: {Blank single:19197::"yes","no"}  Diagnostics: Spirometry:  Tracings reviewed. Her effort: {Blank single:19197::"Good reproducible efforts.","It was hard to get consistent efforts and there is a question as to whether this reflects a maximal maneuver.","Poor effort, data can not be interpreted."} FVC: ***L FEV1: ***L, ***% predicted FEV1/FVC ratio: ***% Interpretation: {Blank single:19197::"Spirometry consistent with mild obstructive disease","Spirometry consistent with moderate obstructive disease","Spirometry consistent with severe obstructive  disease","Spirometry consistent with possible restrictive disease","Spirometry consistent with mixed obstructive and restrictive disease","Spirometry uninterpretable due to technique","Spirometry consistent with normal pattern","No overt abnormalities noted given today's efforts"}.  Please see scanned spirometry results for details.  Skin Testing: {Blank single:19197::"Select foods","Environmental allergy panel","Environmental allergy panel and select foods","Food allergy panel","None","Deferred due to recent antihistamines use"}. *** Results discussed with patient/family.   Past Medical History: Patient Active Problem List   Diagnosis Date Noted  . Benign ethnic neutropenia 01/01/2023  . Chronic insomnia 12/01/2022  . Aortic atherosclerosis (HCC) 11/26/2020  . Diverticulosis 11/26/2020  . Obesity, diabetes, and hypertension syndrome (HCC) 06/05/2020  . Body mass index (BMI) of 40.1-44.9 in adult (HCC) 12/04/2019  . Class 2 severe obesity due to excess calories with serious comorbidity and body mass index (BMI) of 35.0 to 35.9 in adult [facility]) 10/19/2019  . Osteoarthritis of spine with radiculopathy, cervical region 11/30/2017  . Arthritis 06/23/2014  . Prediabetes 10/20/2013  . Obesity (BMI 35.0-39.9 without comorbidity) 10/20/2013  . Hypertension associated with diabetes [facility])    Past Medical History:  Diagnosis Date  . Allergy   . Arthritis   . Diabetes mellitus without complication (HCC)   . Hypertension    Past Surgical History: Past Surgical History:  Procedure Laterality Date  . APPENDECTOMY    . CHOLECYSTECTOMY    . rotator cuff surgery Right 07/2014  . TUBAL LIGATION  Dec. 1990   Medication List:  Current Outpatient Medications  Medication Sig Dispense Refill  . ACCU-CHEK GUIDE test strip TEST BLOOD GLUCOSE FOUR TIMES DAILY AS DIRECTED 400 strip 3  . Accu-Chek Softclix Lancets lancets TEST BLOOD GLUCOSE FOUR TIMES DAILY AS DIRECTED 400 each 3  . Alcohol Swabs  (DROPSAFE ALCOHOL PREP) 70 % PADS USE AS DIRECTED FOUR TIMES DAILY 400 each 3  . amLODipine (NORVASC) 5 MG tablet TAKE 1  TABLET EVERY DAY 90 tablet 3  . aspirin EC 81 MG tablet Take 81 mg by mouth daily.    . blood glucose meter kit and supplies KIT Dispense based on patient and insurance preference. Use up to four times daily as directed. (FOR ICD-9 250.00, 250.01). 1 each 0  . Calcium Carb-Cholecalciferol (CALCIUM 600-D PO) Take by mouth.    . Cholecalciferol (VITAMIN D3 PO) Take by mouth.    . losartan (COZAAR) 25 MG tablet Take 1 tablet (25 mg total) by mouth daily. 90 tablet 3  . metFORMIN (GLUCOPHAGE) 500 MG tablet TAKE 1 TAB 2XDAY WITH MEALS, FOLLOW UP APPOINTMENT DUE IN DECEMBER; MUST SEE PROVIDER FOR REFILLS 180 tablet 3  . Multiple Vitamin (MULTIVITAMIN) tablet Take 1 tablet by mouth daily.    . Omega-3 Fatty Acids (OMEGA-3 2100 PO) Take by mouth daily.    Marland Kitchen OVER THE COUNTER MEDICATION 1,950 mg daily.    . rosuvastatin (CRESTOR) 10 MG tablet TAKE 1 TABLET EVERY DAY 90 tablet 3  . vitamin B-12 (CYANOCOBALAMIN) 1000 MCG tablet Take 1,000 mcg by mouth daily.     No current facility-administered medications for this visit.   Allergies: Allergies  Allergen Reactions  . Ace Inhibitors Cough  . Egg Yolk Other (See Comments)    vomiting  . Adhesive [Tape] Rash   Social History: Social History   Socioeconomic History  . Marital status: Married    Spouse name: Not on file  . Number of children: 2  . Years of education: Masters   . Highest education level: Master's degree (e.g., MA, MS, MEng, MEd, MSW, MBA)  Occupational History  . Occupation: Runner, broadcasting/film/video  Tobacco Use  . Smoking status: Never  . Smokeless tobacco: Never  Vaping Use  . Vaping status: Never Used  Substance and Sexual Activity  . Alcohol use: Yes    Alcohol/week: 1.0 standard drink of alcohol    Types: 1 Glasses of wine per week    Comment: Wine about 1 glass or less per bi-weekly  . Drug use: No  . Sexual  activity: Not Currently  Other Topics Concern  . Not on file  Social History Narrative   Patient has had 2 pregnancies and has 2 living children.   Social Determinants of Health   Financial Resource Strain: Low Risk  (07/16/2023)   Overall Financial Resource Strain (CARDIA)   . Difficulty of Paying Living Expenses: Not hard at all  Food Insecurity: No Food Insecurity (07/16/2023)   Hunger Vital Sign   . Worried About Programme researcher, broadcasting/film/video in the Last Year: Never true   . Ran Out of Food in the Last Year: Never true  Transportation Needs: No Transportation Needs (07/16/2023)   PRAPARE - Transportation   . Lack of Transportation (Medical): No   . Lack of Transportation (Non-Medical): No  Physical Activity: Sufficiently Active (07/16/2023)   Exercise Vital Sign   . Days of Exercise per Week: 5 days   . Minutes of Exercise per Session: 30 min  Recent Concern: Physical Activity - Insufficiently Active (06/06/2023)   Exercise Vital Sign   . Days of Exercise per Week: 4 days   . Minutes of Exercise per Session: 30 min  Stress: No Stress Concern Present (07/16/2023)   [name] of Occupational Health - Occupational Stress Questionnaire   . Feeling of Stress : Not at all  Social Connections: Socially Integrated (07/16/2023)   Social Connection and Isolation Panel [NHANES]   . Frequency of Communication  with Friends and Family: More than three times a week   . Frequency of Social Gatherings with Friends and Family: Three times a week   . Attends Religious Services: More than 4 times per year   . Active Member of Clubs or Organizations: Yes   . Attends Banker Meetings: 1 to 4 times per year   . Marital Status: Married   Lives in a ***. Smoking: *** Occupation: ***  Environmental HistorySurveyor, minerals in the house: Copywriter, advertising in the family room: {Blank single:19197::"yes","no"} Carpet in the bedroom: {Blank  single:19197::"yes","no"} Heating: {Blank single:19197::"electric","gas","heat pump"} Cooling: {Blank single:19197::"central","window","heat pump"} Pet: {Blank single:19197::"yes ***","no"}  Family History: Family History  Problem Relation Age of Onset  . Diabetes Father   . Hypertension Father   . Arthritis Father   . Breast cancer Maternal Aunt        21s  . Cancer Maternal Grandmother        breast cancer  . Breast cancer Maternal Grandmother   . Sickle cell anemia Brother   . Hypertension Brother   . Cancer Brother 15       Leukemia, passed away at 58  . Cancer Maternal Aunt   . Cancer Maternal Aunt   . Hypertension Brother    Problem                               Relation Asthma                                   *** Eczema                                *** Food allergy                          *** Allergic rhino conjunctivitis     ***  Review of Systems  Constitutional:  Negative for appetite change, chills, fever and unexpected weight change.  HENT:  Negative for congestion and rhinorrhea.   Eyes:  Negative for itching.  Respiratory:  Negative for cough, chest tightness, shortness of breath and wheezing.   Cardiovascular:  Negative for chest pain.  Gastrointestinal:  Negative for abdominal pain.  Genitourinary:  Negative for difficulty urinating.  Skin:  Negative for rash.  Neurological:  Negative for headaches.   Objective: There were no vitals taken for this visit. There is no height or weight on file to calculate BMI. Physical Exam Vitals and nursing note reviewed.  Constitutional:      Appearance: Normal appearance. She is well-developed.  HENT:     Head: Normocephalic and atraumatic.     Right Ear: Tympanic membrane and external ear normal.     Left Ear: Tympanic membrane and external ear normal.     Nose: Nose normal.     Mouth/Throat:     Mouth: Mucous membranes are moist.     Pharynx: Oropharynx is clear.  Eyes:     Conjunctiva/sclera:  Conjunctivae normal.  Cardiovascular:     Rate and Rhythm: Normal rate and regular rhythm.     Heart sounds: Normal heart sounds. No murmur heard.    No friction rub. No gallop.  Pulmonary:     Effort: Pulmonary effort is normal.  Breath sounds: Normal breath sounds. No wheezing, rhonchi or rales.  Musculoskeletal:     Cervical back: Neck supple.  Skin:    General: Skin is warm.     Findings: No rash.  Neurological:     Mental Status: She is alert and oriented to person, place, and time.  Psychiatric:        Behavior: Behavior normal.  The plan was reviewed with the patient/family, and all questions/concerned were addressed.  It was my pleasure to see Zahava today and participate in her care. Please feel free to contact me with any questions or concerns.  Sincerely,  Wyline Mood, DO Allergy & Immunology  [facility] office: [phone] [facility] office: [phone]

## 2023-07-19 ENCOUNTER — Encounter: Payer: Self-pay | Admitting: Emergency Medicine

## 2023-07-19 ENCOUNTER — Ambulatory Visit: Payer: Medicare PPO | Admitting: Allergy

## 2023-07-19 ENCOUNTER — Other Ambulatory Visit: Payer: Self-pay

## 2023-07-19 ENCOUNTER — Ambulatory Visit (INDEPENDENT_AMBULATORY_CARE_PROVIDER_SITE_OTHER): Payer: Medicare PPO | Admitting: Emergency Medicine

## 2023-07-19 ENCOUNTER — Encounter: Payer: Self-pay | Admitting: Allergy

## 2023-07-19 ENCOUNTER — Other Ambulatory Visit (HOSPITAL_COMMUNITY): Payer: Self-pay

## 2023-07-19 VITALS — BP 142/86 | HR 87 | Temp 97.2°F | Resp 18 | Ht 64.57 in | Wt 254.0 lb

## 2023-07-19 VITALS — BP 138/86 | HR 81 | Temp 98.0°F | Ht 64.0 in | Wt 252.6 lb

## 2023-07-19 DIAGNOSIS — E1159 Type 2 diabetes mellitus with other circulatory complications: Secondary | ICD-10-CM | POA: Diagnosis not present

## 2023-07-19 DIAGNOSIS — I152 Hypertension secondary to endocrine disorders: Secondary | ICD-10-CM | POA: Diagnosis not present

## 2023-07-19 DIAGNOSIS — T781XXD Other adverse food reactions, not elsewhere classified, subsequent encounter: Secondary | ICD-10-CM | POA: Diagnosis not present

## 2023-07-19 DIAGNOSIS — I700 Atherosclerosis of aorta: Secondary | ICD-10-CM | POA: Diagnosis not present

## 2023-07-19 DIAGNOSIS — Z7984 Long term (current) use of oral hypoglycemic drugs: Secondary | ICD-10-CM

## 2023-07-19 MED ORDER — LOSARTAN POTASSIUM 25 MG PO TABS
25.0000 mg | ORAL_TABLET | Freq: Every day | ORAL | 3 refills | Status: DC
  Filled 2023-07-19 – 2024-02-22 (×2): qty 90, 90d supply, fill #0
  Filled 2024-05-23: qty 90, 90d supply, fill #1

## 2023-07-19 MED ORDER — TIRZEPATIDE 5 MG/0.5ML ~~LOC~~ SOAJ
5.0000 mg | SUBCUTANEOUS | 3 refills | Status: DC
Start: 2023-07-19 — End: 2023-09-03
  Filled 2023-07-19: qty 2, 28d supply, fill #0
  Filled 2023-08-03: qty 2, 28d supply, fill #1
  Filled 2023-08-05 (×2): qty 2, 28d supply, fill #0
  Filled 2023-08-05: qty 2, 28d supply, fill #1
  Filled 2023-08-09: qty 2, 28d supply, fill #0
  Filled ????-??-??: fill #1

## 2023-07-19 NOTE — Patient Instructions (Signed)

## 2023-07-19 NOTE — Assessment & Plan Note (Signed)
Cardiovascular risks associated with obesity discussed Benefits of exercise discussed Advised to decrease amount of daily carbohydrate intake and daily calories and increase amount of plant-based protein in her diet. Will benefit from starting GLP-1 agonist as per insurance's formulary

## 2023-07-19 NOTE — Assessment & Plan Note (Signed)
Diet and nutrition discussed. Continue rosuvastatin 10 mg daily.  

## 2023-07-19 NOTE — Progress Notes (Signed)
Katherine [name] 67 y.o.   Chief Complaint  Patient presents with   Weight Loss    Weight loss and BMI. Notes of walking everyday and dietary changes but not losing weight. Interested in weight loss medication Zepbound    HISTORY OF PRESENT ILLNESS: This is a 67 y.o. female with history of hypertension and diabetes, interested in starting diabetic weight loss medications such as Ozempic or Mounjaro or Zepbound No other complaints or medical concerns today.  HPI   Prior to Admission medications   Medication Sig Start Date End Date Taking? Authorizing Provider  ACCU-CHEK GUIDE test strip TEST BLOOD GLUCOSE FOUR TIMES DAILY AS DIRECTED 03/31/23   Georgina Quint, MD  Accu-Chek Softclix Lancets lancets TEST BLOOD GLUCOSE FOUR TIMES DAILY AS DIRECTED 03/31/23   Georgina Quint, MD  Alcohol Swabs (DROPSAFE ALCOHOL PREP) 70 % PADS USE AS DIRECTED FOUR TIMES DAILY 03/31/23   Georgina Quint, MD  amLODipine (NORVASC) 5 MG tablet TAKE 1 TABLET EVERY DAY 10/28/22   Georgina Quint, MD  aspirin EC 81 MG tablet Take 81 mg by mouth daily.    [provider]  blood glucose meter kit and supplies KIT Dispense based on patient and insurance preference. Use up to four times daily as directed. (FOR ICD-9 250.00, 250.01). 06/20/18   Georgina Quint, MD  Calcium Carb-Cholecalciferol (CALCIUM 600-D PO) Take by mouth.    [provider]  Cholecalciferol (VITAMIN D3 PO) Take by mouth.    [provider]  metFORMIN (GLUCOPHAGE) 500 MG tablet TAKE 1 TAB 2XDAY WITH MEALS, FOLLOW UP APPOINTMENT DUE IN DECEMBER; MUST SEE PROVIDER FOR REFILLS Patient taking differently: Take 500 mg by mouth daily. TAKE 1 TAB 2XDAY WITH MEALS, FOLLOW UP APPOINTMENT DUE IN DECEMBER; MUST SEE PROVIDER FOR REFILLS 11/04/22   Georgina Quint, MD  Omega-3 Fatty Acids (OMEGA-3 2100 PO) Take by mouth daily.    [provider]  OVER THE COUNTER MEDICATION 1,950 mg daily.     [provider]  rosuvastatin (CRESTOR) 10 MG tablet TAKE 1 TABLET EVERY DAY 10/28/22   Khylon Davies, Eilleen Kempf, MD  vitamin B-12 (CYANOCOBALAMIN) 1000 MCG tablet Take 1,000 mcg by mouth daily.    [provider]    Allergies  Allergen Reactions   Ace Inhibitors Cough   Egg Yolk Other (See Comments)    vomiting   Lisinopril Other (See Comments)   Adhesive [Tape] Rash    Patient Active Problem List   Diagnosis Date Noted   Benign ethnic neutropenia 01/01/2023   Chronic insomnia 12/01/2022   Aortic atherosclerosis (HCC) 11/26/2020   Diverticulosis 11/26/2020   Obesity, diabetes, and hypertension syndrome (HCC) 06/05/2020   Body mass index (BMI) of 40.1-44.9 in adult (HCC) 12/04/2019   Class 2 severe obesity due to excess calories with serious comorbidity and body mass index (BMI) of 35.0 to 35.9 in adult [facility]) 10/19/2019   Osteoarthritis of spine with radiculopathy, cervical region 11/30/2017   Arthritis 06/23/2014   Prediabetes 10/20/2013   Obesity (BMI 35.0-39.9 without comorbidity) 10/20/2013   Hypertension associated with diabetes [facility])     Past Medical History:  Diagnosis Date   Allergy    Arthritis    Diabetes mellitus without complication (HCC)    Hypertension    Urticaria     Past Surgical History:  Procedure Laterality Date   APPENDECTOMY     CHOLECYSTECTOMY     rotator cuff surgery Right 07/2014   TUBAL LIGATION  Dec.  1990    Social History   Socioeconomic History   Marital status: Married    Spouse name: Not on file   Number of children: 2   Years of education: Masters    Highest education level: Master's degree (e.g., MA, MS, MEng, MEd, MSW, MBA)  Occupational History   Occupation: Teacher  Tobacco Use   Smoking status: Never    Passive exposure: Never   Smokeless tobacco: Never  Vaping Use   Vaping status: Never Used  Substance and Sexual Activity   Alcohol use: Yes    Alcohol/week: 1.0 standard drink of alcohol    Types: 1  Glasses of wine per week    Comment: Wine about 1 glass or less per bi-weekly   Drug use: No   Sexual activity: Not Currently  Other Topics Concern   Not on file  Social History Narrative   Patient has had 2 pregnancies and has 2 living children.   Social Determinants of Health   Financial Resource Strain: Low Risk  (07/16/2023)   Overall Financial Resource Strain (CARDIA)    Difficulty of Paying Living Expenses: Not hard at all  Food Insecurity: No Food Insecurity (07/16/2023)   Hunger Vital Sign    Worried About Running Out of Food in the Last Year: Never true    Ran Out of Food in the Last Year: Never true  Transportation Needs: No Transportation Needs (07/16/2023)   PRAPARE - Administrator, Civil Service (Medical): No    Lack of Transportation (Non-Medical): No  Physical Activity: Sufficiently Active (07/16/2023)   Exercise Vital Sign    Days of Exercise per Week: 5 days    Minutes of Exercise per Session: 30 min  Recent Concern: Physical Activity - Insufficiently Active (06/06/2023)   Exercise Vital Sign    Days of Exercise per Week: 4 days    Minutes of Exercise per Session: 30 min  Stress: No Stress Concern Present (07/16/2023)   [name] of Occupational Health - Occupational Stress Questionnaire    Feeling of Stress : Not at all  Social Connections: Socially Integrated (07/16/2023)   Social Connection and Isolation Panel [NHANES]    Frequency of Communication with Friends and Family: More than three times a week    Frequency of Social Gatherings with Friends and Family: Three times a week    Attends Religious Services: More than 4 times per year    Active Member of Clubs or Organizations: Yes    Attends Banker Meetings: 1 to 4 times per year    Marital Status: Married  Catering manager Violence: Not At Risk (03/23/2022)   Humiliation, Afraid, Rape, and Kick questionnaire    Fear of Current or Ex-Partner: No    Emotionally Abused: No     Physically Abused: No    Sexually Abused: No    Family History  Problem Relation Age of Onset   Diabetes Father    Hypertension Father    Arthritis Father    Breast cancer Maternal Aunt        13s   Cancer Maternal Grandmother        breast cancer   Breast cancer Maternal Grandmother    Sickle cell anemia Brother    Hypertension Brother    Cancer Brother 15       Leukemia, passed away at 58   Cancer Maternal Aunt    Cancer Maternal Aunt    Hypertension Brother      Review  of Systems  Constitutional: Negative.  Negative for chills and fever.  HENT: Negative.  Negative for congestion and sore throat.   Respiratory: Negative.  Negative for cough and shortness of breath.   Cardiovascular: Negative.  Negative for chest pain and palpitations.  Gastrointestinal:  Negative for abdominal pain, diarrhea, nausea and vomiting.  Genitourinary: Negative.  Negative for dysuria and hematuria.  Neurological: Negative.  Negative for dizziness and headaches.  All other systems reviewed and are negative.   Vitals:   07/19/23 1454  Pulse: 81  Temp: 98 F (36.7 C)  SpO2: 98%    Physical Exam Vitals reviewed.  Constitutional:      Appearance: Normal appearance. She is obese.  HENT:     Head: Normocephalic.  Eyes:     Extraocular Movements: Extraocular movements intact.  Cardiovascular:     Rate and Rhythm: Normal rate.  Pulmonary:     Effort: Pulmonary effort is normal.  Skin:    General: Skin is warm and dry.  Neurological:     Mental Status: She is alert and oriented to person, place, and time.  Psychiatric:        Mood and Affect: Mood normal.        Behavior: Behavior normal.    Lab Results  Component Value Date   HGBA1C 6.8 (H) 06/07/2023   Wt Readings from Last 3 Encounters:  07/19/23 252 lb 9.6 oz (114.6 kg)  07/19/23 254 lb (115.2 kg)  06/07/23 251 lb 3.2 oz (113.9 kg)     ASSESSMENT & PLAN: A total of 43 minutes was spent with the patient and  counseling/coordination of care regarding preparing for this visit, review of most recent office visit notes, review of most recent blood work results, review of chronic medical conditions under management, review of all medications and changes made, cardiovascular risks associated with hypertension and diabetes, education on nutrition, prognosis, documentation and need for follow-up.  Problem List Items Addressed This Visit       Cardiovascular and Mediastinum   Hypertension associated with diabetes (HCC) - Primary    BP Readings from Last 3 Encounters:  07/19/23 138/86  07/19/23 (!) 142/86  06/07/23 (!) 146/84  Well-controlled hypertension with normal blood pressure readings at home Continue amlodipine 5 mg and losartan 25 mg daily Last hemoglobin A1c at 6.8 Continue metformin 500 mg twice a day and start GLP-1 agonist as per insurance's formulary Cardiovascular risks associated with hypertension and diabetes discussed Continue rosuvastatin 10 mg daily Diet and nutrition discussed [name] in 3 months       Relevant Medications   tirzepatide (MOUNJARO) 5 MG/0.5ML Pen   losartan (COZAAR) 25 MG tablet   Aortic atherosclerosis (HCC)    Diet and nutrition discussed Continue rosuvastatin 10 mg daily      Relevant Medications   losartan (COZAAR) 25 MG tablet     Other   Morbid obesity (HCC)    Cardiovascular risks associated with obesity discussed Benefits of exercise discussed Advised to decrease amount of daily carbohydrate intake and daily calories and increase amount of plant-based protein in her diet. Will benefit from starting GLP-1 agonist as per insurance's formulary      Relevant Medications   tirzepatide [facility]) 5 MG/0.5ML Pen   Patient Instructions  Diabetes Mellitus and Nutrition, Adult When you have diabetes, or diabetes mellitus, it is very important to have healthy eating habits because your blood sugar (glucose) levels are greatly affected by what you eat  and drink. Eating healthy foods  in the right amounts, at about the same times every day, can help you: Manage your blood glucose. Lower your risk of heart disease. Improve your blood pressure. Reach or maintain a healthy weight. What can affect my meal plan? Every person with diabetes is different, and each person has different needs for a meal plan. Your health care provider may recommend that you work with a dietitian to make a meal plan that is best for you. Your meal plan may vary depending on factors such as: The calories you need. The medicines you take. Your weight. Your blood glucose, blood pressure, and cholesterol levels. Your activity level. Other health conditions you have, such as heart or kidney disease. How do carbohydrates affect me? Carbohydrates, also called carbs, affect your blood glucose level more than any other type of food. Eating carbs raises the amount of glucose in your blood. It is important to know how many carbs you can safely have in each meal. This is different for every person. Your dietitian can help you calculate how many carbs you should have at each meal and for each snack. How does alcohol affect me? Alcohol can cause a decrease in blood glucose (hypoglycemia), especially if you use insulin or take certain diabetes medicines by mouth. Hypoglycemia can be a life-threatening condition. Symptoms of hypoglycemia, such as sleepiness, dizziness, and confusion, are similar to symptoms of having too much alcohol. Do not drink alcohol if: Your health care provider tells you not to drink. You are pregnant, may be pregnant, or are planning to become pregnant. If you drink alcohol: Limit how much you have to: 0-1 drink a day for women. 0-2 drinks a day for men. Know how much alcohol is in your drink. In the [place] one drink equals one 12 oz bottle of beer (355 mL), one 5 oz glass of wine (148 mL), or one 1 oz glass of hard liquor (44 mL). Keep yourself hydrated  with water, diet soda, or unsweetened iced tea. Keep in mind that regular soda, juice, and other mixers may contain a lot of sugar and must be counted as carbs. What are tips for following this plan?  Reading food labels Start by checking the serving size on the Nutrition Facts label of packaged foods and drinks. The number of calories and the amount of carbs, fats, and other nutrients listed on the label are based on one serving of the item. Many items contain more than one serving per package. Check the total grams (g) of carbs in one serving. Check the number of grams of saturated fats and trans fats in one serving. Choose foods that have a low amount or none of these fats. Check the number of milligrams (mg) of salt (sodium) in one serving. Most people should limit total sodium intake to less than 2,300 mg per day. Always check the nutrition information of foods labeled as "low-fat" or "nonfat." These foods may be higher in added sugar or refined carbs and should be avoided. Talk to your dietitian to identify your daily goals for nutrients listed on the label. Shopping Avoid buying canned, pre-made, or processed foods. These foods tend to be high in fat, sodium, and added sugar. Shop around the outside edge of the grocery store. This is where you will most often find fresh fruits and vegetables, bulk grains, fresh meats, and fresh dairy products. Cooking Use low-heat cooking methods, such as baking, instead of high-heat cooking methods, such as deep frying. Cook using healthy oils, such as  olive, canola, or sunflower oil. Avoid cooking with butter, cream, or high-fat meats. Meal planning Eat meals and snacks regularly, preferably at the same times every day. Avoid going long periods of time without eating. Eat foods that are high in fiber, such as fresh fruits, vegetables, beans, and whole grains. Eat 4-6 oz (112-168 g) of lean protein each day, such as lean meat, chicken, fish, eggs, or  tofu. One ounce (oz) (28 g) of lean protein is equal to: 1 oz (28 g) of meat, chicken, or fish. 1 egg.  cup (62 g) of tofu. Eat some foods each day that contain healthy fats, such as avocado, nuts, seeds, and fish. What foods should I eat? Fruits Berries. Apples. Oranges. Peaches. Apricots. Plums. Grapes. Mangoes. Papayas. Pomegranates. Kiwi. Cherries. Vegetables Leafy greens, including lettuce, spinach, kale, chard, collard greens, mustard greens, and cabbage. Beets. Cauliflower. Broccoli. Carrots. Green beans. Tomatoes. Peppers. Onions. Cucumbers. Brussels sprouts. Grains Whole grains, such as whole-wheat or whole-grain bread, crackers, tortillas, cereal, and pasta. Unsweetened oatmeal. Quinoa. Brown or wild rice. Meats and other proteins Seafood. Poultry without skin. Lean cuts of poultry and beef. Tofu. Nuts. Seeds. Dairy Low-fat or fat-free dairy products such as milk, yogurt, and cheese. The items listed above may not be a complete list of foods and beverages you can eat and drink. Contact a dietitian for more information. What foods should I avoid? Fruits Fruits canned with syrup. Vegetables Canned vegetables. Frozen vegetables with butter or cream sauce. Grains Refined white flour and flour products such as bread, pasta, snack foods, and cereals. Avoid all processed foods. Meats and other proteins Fatty cuts of meat. Poultry with skin. Breaded or fried meats. Processed meat. Avoid saturated fats. Dairy Full-fat yogurt, cheese, or milk. Beverages Sweetened drinks, such as soda or iced tea. The items listed above may not be a complete list of foods and beverages you should avoid. Contact a dietitian for more information. Questions to ask a health care provider Do I need to meet with a certified diabetes care and education specialist? Do I need to meet with a dietitian? What number can I call if I have questions? When are the best times to check my blood glucose? Where to  find more information: American Diabetes Association: diabetes.org Academy of Nutrition and Dietetics: eatright.[employer] of Diabetes and Digestive and Kidney Diseases: StageSync.si Association of Diabetes Care & Education Specialists: diabeteseducator.org Summary It is important to have healthy eating habits because your blood sugar (glucose) levels are greatly affected by what you eat and drink. It is important to use alcohol carefully. A healthy meal plan will help you manage your blood glucose and lower your risk of heart disease. Your health care provider may recommend that you work with a dietitian to make a meal plan that is best for you. This information is not intended to replace advice given to you by your health care provider. Make sure you discuss any questions you have with your health care provider. Document Revised: 04/17/2020 Document Reviewed: 04/17/2020 Elsevier Patient Education  2024 Elsevier Inc.     Edwina Barth, MD [facility] at [facility]

## 2023-07-19 NOTE — Assessment & Plan Note (Signed)
BP Readings from Last 3 Encounters:  07/19/23 138/86  07/19/23 (!) 142/86  06/07/23 (!) 146/84  Well-controlled hypertension with normal blood pressure readings at home Continue amlodipine 5 mg and losartan 25 mg daily Last hemoglobin A1c at 6.8 Continue metformin 500 mg twice a day and start GLP-1 agonist as per insurance's formulary Cardiovascular risks associated with hypertension and diabetes discussed Continue rosuvastatin 10 mg daily Diet and nutrition discussed [name] in 3 months

## 2023-07-19 NOTE — Patient Instructions (Signed)
Egg  Start strict avoidance of egg yolk. Okay to eat egg white from carton as before. Okay to eat baked egg items such as cakes/breads as before. For mild symptoms you can take over the counter antihistamines such as Benadryl 1-2 tablets = 25-50mg  and monitor symptoms closely.  If symptoms worsen or if you have severe symptoms including breathing issues, throat closure, significant swelling, whole body hives, severe diarrhea and vomiting, lightheadedness then seek immediate medical care.  Get bloodwork We are ordering labs, so please allow 1-2 weeks for the results to come back. With the newly implemented Cures Act, the labs might be visible to you at the same time that they become visible to me. However, I will not address the results until all of the results are back, so please be patient.  In the meantime, continue recommendations in your patient instructions, including avoidance measures (if applicable), until you hear from me.  Return in about 1 year (around 07/18/2024). Or sooner if needed.

## 2023-07-20 ENCOUNTER — Other Ambulatory Visit (HOSPITAL_COMMUNITY): Payer: Self-pay

## 2023-07-22 LAB — ALLERGEN EGG YOLK F75: IgE Egg (Yolk): <0.1 kU/L

## 2023-07-22 LAB — IGE EGG WHITE W/COMPONENT RFLX: F001-IgE Egg White: <0.1 kU/L

## 2023-07-29 DIAGNOSIS — E1165 Type 2 diabetes mellitus with hyperglycemia: Secondary | ICD-10-CM | POA: Diagnosis not present

## 2023-07-29 DIAGNOSIS — I10 Essential (primary) hypertension: Secondary | ICD-10-CM | POA: Diagnosis not present

## 2023-08-03 ENCOUNTER — Other Ambulatory Visit: Payer: Self-pay

## 2023-08-05 ENCOUNTER — Other Ambulatory Visit (HOSPITAL_COMMUNITY): Payer: Self-pay

## 2023-08-06 ENCOUNTER — Other Ambulatory Visit (HOSPITAL_COMMUNITY): Payer: Self-pay

## 2023-08-09 ENCOUNTER — Other Ambulatory Visit: Payer: Self-pay

## 2023-08-09 ENCOUNTER — Other Ambulatory Visit (HOSPITAL_COMMUNITY): Payer: Self-pay

## 2023-08-18 ENCOUNTER — Other Ambulatory Visit: Payer: Self-pay | Admitting: Emergency Medicine

## 2023-08-18 DIAGNOSIS — E1159 Type 2 diabetes mellitus with other circulatory complications: Secondary | ICD-10-CM

## 2023-08-18 DIAGNOSIS — I10 Essential (primary) hypertension: Secondary | ICD-10-CM

## 2023-08-23 ENCOUNTER — Encounter: Payer: Self-pay | Admitting: Family Medicine

## 2023-08-23 ENCOUNTER — Other Ambulatory Visit (HOSPITAL_COMMUNITY): Payer: Self-pay

## 2023-08-23 ENCOUNTER — Ambulatory Visit: Payer: Medicare PPO | Admitting: Family Medicine

## 2023-08-23 VITALS — BP 94/64 | HR 82 | Temp 97.9°F | Resp 20 | Ht 64.0 in | Wt 238.0 lb

## 2023-08-23 DIAGNOSIS — K5792 Diverticulitis of intestine, part unspecified, without perforation or abscess without bleeding: Secondary | ICD-10-CM

## 2023-08-23 DIAGNOSIS — K5903 Drug induced constipation: Secondary | ICD-10-CM | POA: Diagnosis not present

## 2023-08-23 MED ORDER — CIPROFLOXACIN HCL 500 MG PO TABS
500.0000 mg | ORAL_TABLET | Freq: Two times a day (BID) | ORAL | 0 refills | Status: AC
  Filled 2023-08-23: qty 14, 7d supply, fill #0

## 2023-08-23 MED ORDER — METRONIDAZOLE 500 MG PO TABS
500.0000 mg | ORAL_TABLET | Freq: Two times a day (BID) | ORAL | 0 refills | Status: AC
  Filled 2023-08-23: qty 14, 7d supply, fill #0

## 2023-08-23 NOTE — Patient Instructions (Addendum)
Switch Calcium Carbonate to Calcium Citrate (Citracal)  Miralax 1 capful in 6-8 oz beverage of choice once daily as needed.

## 2023-08-23 NOTE — Progress Notes (Signed)
Assessment & Plan:  1. Diverticulitis Education provided on diverticulitis. - metroNIDAZOLE (FLAGYL) 500 MG tablet; Take 1 tablet (500 mg total) by mouth 2 (two) times daily for 7 days.  Dispense: 14 tablet; Refill: 0 - ciprofloxacin (CIPRO) 500 MG tablet; Take 1 tablet (500 mg total) by mouth 2 (two) times daily for 7 days.  Dispense: 14 tablet; Refill: 0  2. Drug-induced constipation Recommended Miralax 1 capful in 6-8 oz beverage of choice once daily as needed as well as switching from calcium carbonate to calcium citrate.   Follow up plan: Return if symptoms worsen or fail to improve.  [name], MSN, APRN, FNP-C  Subjective:  HPI: Katherine [name] is a 67 y.o. female presenting on 08/23/2023 for Abdominal Pain (Left side pain - HX of Diverticulitis ) and Constipation (Thinks related to [facility])  Patient reports severe abdominal pain on the left side that feels exactly like a diverticulitis flareup.  Her last diverticulitis flareup was in 2021.  She denies any nausea, vomiting, or fever.  She does report new onset constipation that she feels is related to her new medication, Mounjaro.  She has tried lemon juice with warm water which was not effective and moving her bowels.  She does take calcium carbonate as a supplement.    ROS: Negative unless specifically indicated above in HPI.   Relevant past medical history reviewed and updated as indicated.   Allergies and medications reviewed and updated.   Current Outpatient Medications:    ACCU-CHEK GUIDE test strip, TEST BLOOD GLUCOSE FOUR TIMES DAILY AS DIRECTED, Disp: 400 strip, Rfl: 3   Accu-Chek Softclix Lancets lancets, TEST BLOOD GLUCOSE FOUR TIMES DAILY AS DIRECTED, Disp: 400 each, Rfl: 3   Alcohol Swabs (DROPSAFE ALCOHOL PREP) 70 % PADS, USE AS DIRECTED FOUR TIMES DAILY, Disp: 400 each, Rfl: 3   amLODipine (NORVASC) 5 MG tablet, TAKE 1 TABLET EVERY DAY, Disp: 90 tablet, Rfl: 3   aspirin EC 81 MG tablet, Take 81 mg  by mouth daily., Disp: , Rfl:    blood glucose meter kit and supplies KIT, Dispense based on patient and insurance preference. Use up to four times daily as directed. (FOR ICD-9 250.00, 250.01)., Disp: 1 each, Rfl: 0   Calcium Carb-Cholecalciferol (CALCIUM 600-D PO), Take by mouth., Disp: , Rfl:    Cholecalciferol (VITAMIN D3 PO), Take by mouth., Disp: , Rfl:    losartan (COZAAR) 25 MG tablet, Take 1 tablet (25 mg total) by mouth daily., Disp: 90 tablet, Rfl: 3   metFORMIN (GLUCOPHAGE) 500 MG tablet, TAKE 1 TAB 2XDAY WITH MEALS, FOLLOW UP APPOINTMENT DUE IN DECEMBER; MUST SEE PROVIDER FOR REFILLS (Patient taking differently: Take 500 mg by mouth daily. TAKE 1 TAB 2XDAY WITH MEALS, FOLLOW UP APPOINTMENT DUE IN DECEMBER; MUST SEE PROVIDER FOR REFILLS), Disp: 180 tablet, Rfl: 3   Omega-3 Fatty Acids (OMEGA-3 2100 PO), Take by mouth daily., Disp: , Rfl:    OVER THE COUNTER MEDICATION, 1,950 mg daily., Disp: , Rfl:    rosuvastatin (CRESTOR) 10 MG tablet, TAKE 1 TABLET EVERY DAY, Disp: 90 tablet, Rfl: 3   tirzepatide (MOUNJARO) 5 MG/0.5ML Pen, Inject 5 mg into the skin once a week., Disp: 2 mL, Rfl: 3   vitamin B-12 (CYANOCOBALAMIN) 1000 MCG tablet, Take 1,000 mcg by mouth daily., Disp: , Rfl:   Allergies  Allergen Reactions   Ace Inhibitors Cough   Egg Yolk Other (See Comments)    vomiting   Lisinopril Other (See Comments)  Adhesive [Tape] Rash    Objective:   BP 94/64   Pulse 82   Temp 97.9 F (36.6 C)   Resp 20   Ht 5\' 4"  (1.626 m)   Wt 238 lb (108 kg)   BMI 40.85 kg/m    Physical Exam Vitals reviewed.  Constitutional:      General: She is not in acute distress.    Appearance: Normal appearance. She is not ill-appearing, toxic-appearing or diaphoretic.  HENT:     Head: Normocephalic and atraumatic.  Eyes:     General: No scleral icterus.       Right eye: No discharge.        Left eye: No discharge.     Conjunctiva/sclera: Conjunctivae normal.  Cardiovascular:     Rate and  Rhythm: Normal rate and regular rhythm.     Heart sounds: Normal heart sounds. No murmur heard.    No friction rub. No gallop.  Pulmonary:     Effort: Pulmonary effort is normal. No respiratory distress.     Breath sounds: Normal breath sounds. No stridor. No wheezing, rhonchi or rales.  Abdominal:     General: Bowel sounds are normal.     Tenderness: There is abdominal tenderness in the left upper quadrant and left lower quadrant.  Musculoskeletal:        General: Normal range of motion.     Cervical back: Normal range of motion.  Skin:    General: Skin is warm and dry.     Capillary Refill: Capillary refill takes less than 2 seconds.  Neurological:     General: No focal deficit present.     Mental Status: She is alert and oriented to person, place, and time. Mental status is at baseline.  Psychiatric:        Mood and Affect: Mood normal.        Behavior: Behavior normal.        Thought Content: Thought content normal.        Judgment: Judgment normal.

## 2023-08-25 ENCOUNTER — Other Ambulatory Visit: Payer: Self-pay | Admitting: Emergency Medicine

## 2023-08-25 DIAGNOSIS — E1159 Type 2 diabetes mellitus with other circulatory complications: Secondary | ICD-10-CM

## 2023-09-03 ENCOUNTER — Other Ambulatory Visit: Payer: Self-pay | Admitting: Nurse Practitioner

## 2023-09-03 ENCOUNTER — Other Ambulatory Visit (HOSPITAL_COMMUNITY): Payer: Self-pay

## 2023-09-03 ENCOUNTER — Encounter: Payer: Self-pay | Admitting: Emergency Medicine

## 2023-09-03 DIAGNOSIS — E1159 Type 2 diabetes mellitus with other circulatory complications: Secondary | ICD-10-CM

## 2023-09-03 MED ORDER — TIRZEPATIDE 7.5 MG/0.5ML ~~LOC~~ SOAJ
7.5000 mg | SUBCUTANEOUS | 0 refills | Status: DC
  Filled 2023-09-03: qty 6, 84d supply, fill #0

## 2023-09-06 ENCOUNTER — Other Ambulatory Visit (HOSPITAL_COMMUNITY): Payer: Self-pay

## 2023-09-14 DIAGNOSIS — E119 Type 2 diabetes mellitus without complications: Secondary | ICD-10-CM | POA: Diagnosis not present

## 2023-09-14 DIAGNOSIS — H524 Presbyopia: Secondary | ICD-10-CM | POA: Diagnosis not present

## 2023-09-14 LAB — HM DIABETES EYE EXAM

## 2023-10-18 ENCOUNTER — Ambulatory Visit: Payer: Medicare PPO | Admitting: Emergency Medicine

## 2023-10-18 ENCOUNTER — Encounter: Payer: Self-pay | Admitting: Emergency Medicine

## 2023-10-18 VITALS — BP 122/78 | HR 94 | Temp 97.8°F | Ht 64.0 in | Wt 227.0 lb

## 2023-10-18 DIAGNOSIS — I152 Hypertension secondary to endocrine disorders: Secondary | ICD-10-CM | POA: Diagnosis not present

## 2023-10-18 DIAGNOSIS — E669 Obesity, unspecified: Secondary | ICD-10-CM

## 2023-10-18 DIAGNOSIS — E1159 Type 2 diabetes mellitus with other circulatory complications: Secondary | ICD-10-CM

## 2023-10-18 DIAGNOSIS — E1169 Type 2 diabetes mellitus with other specified complication: Secondary | ICD-10-CM | POA: Diagnosis not present

## 2023-10-18 DIAGNOSIS — Z6841 Body Mass Index (BMI) 40.0 and over, adult: Secondary | ICD-10-CM | POA: Diagnosis not present

## 2023-10-18 DIAGNOSIS — I700 Atherosclerosis of aorta: Secondary | ICD-10-CM | POA: Diagnosis not present

## 2023-10-18 DIAGNOSIS — Z7984 Long term (current) use of oral hypoglycemic drugs: Secondary | ICD-10-CM

## 2023-10-18 LAB — POCT GLYCOSYLATED HEMOGLOBIN (HGB A1C): Hemoglobin A1C: 5.5 % (ref 4.0–5.6)

## 2023-10-18 NOTE — Assessment & Plan Note (Signed)
Well-controlled hypertension with normal blood pressure readings at home Continue amlodipine 5 mg and losartan 25 mg daily Well-controlled diabetes with hemoglobin A1c of 5.5 Continue weekly Mounjaro 7.5 mg along with daily metformin 500 mg twice a day Eating better and losing weight [name] 3 months

## 2023-10-18 NOTE — Patient Instructions (Signed)

## 2023-10-18 NOTE — Progress Notes (Signed)
Katherine [name] 68 y.o.   Chief Complaint  Patient presents with   [name]    3 month f/u. No other concerns     HISTORY OF PRESENT ILLNESS: This is a 69 y.o. female here for 63-month follow-up of diabetes and hypertension Overall doing well.  Eating better and losing weight. Has no complaints or medical concerns today. Lab Results  Component Value Date   HGBA1C 5.5 10/18/2023   Wt Readings from Last 3 Encounters:  10/18/23 227 lb (103 kg)  08/23/23 238 lb (108 kg)  07/19/23 252 lb 9.6 oz (114.6 kg)   BP Readings from Last 3 Encounters:  10/18/23 122/78  08/23/23 94/64  07/19/23 138/86     HPI   Prior to Admission medications   Medication Sig Start Date End Date Taking? Authorizing Provider  ACCU-CHEK GUIDE test strip TEST BLOOD GLUCOSE FOUR TIMES DAILY AS DIRECTED 03/31/23  Yes Babacar Haycraft, Eilleen Kempf, MD  Accu-Chek Softclix Lancets lancets TEST BLOOD GLUCOSE FOUR TIMES DAILY AS DIRECTED 03/31/23  Yes Georgina Quint, MD  Alcohol Swabs (DROPSAFE ALCOHOL PREP) 70 % PADS USE AS DIRECTED FOUR TIMES DAILY 03/31/23  Yes Georgina Quint, MD  amLODipine (NORVASC) 5 MG tablet TAKE 1 TABLET EVERY DAY 08/18/23  Yes Georgina Quint, MD  aspirin EC 81 MG tablet Take 81 mg by mouth daily.   Yes [provider]  blood glucose meter kit and supplies KIT Dispense based on patient and insurance preference. Use up to four times daily as directed. (FOR ICD-9 250.00, 250.01). 06/20/18  Yes Sandee Bernath, Eilleen Kempf, MD  Calcium Carb-Cholecalciferol (CALCIUM 600-D PO) Take by mouth.   Yes [provider]  Cholecalciferol (VITAMIN D3 PO) Take by mouth.   Yes [provider]  COMIRNATY syringe Inject 0.3 mLs into the muscle once. 06/02/23  Yes [provider]  FLUAD 0.5 ML injection Inject 0.5 mLs into the muscle once. 06/02/23  Yes [provider]  losartan (COZAAR) 25 MG tablet Take 1 tablet (25 mg total) by mouth daily. 07/19/23  Yes  Breah Joa, Eilleen Kempf, MD  metFORMIN (GLUCOPHAGE) 500 MG tablet TAKE 1 TABLET TWICE DAILY WITH MEALS, FOLLOW UP APPOINTMENT DUE IN DECEMBER; MUST SEE PROVIDER FOR REFILLS 08/25/23  Yes Jenavie Stanczak, Eilleen Kempf, MD  Omega-3 Fatty Acids (OMEGA-3 2100 PO) Take by mouth daily.   Yes [provider]  OVER THE COUNTER MEDICATION 1,950 mg daily.   Yes [provider]  rosuvastatin (CRESTOR) 10 MG tablet TAKE 1 TABLET EVERY DAY 08/18/23  Yes Julious Langlois, Adrian, MD  tirzepatide [facility]) 7.5 MG/0.5ML Pen Inject 7.5 mg into the skin once a week. 09/03/23  Yes Elenore Paddy, NP  vitamin B-12 (CYANOCOBALAMIN) 1000 MCG tablet Take 1,000 mcg by mouth daily.   Yes [provider]    Allergies  Allergen Reactions   Ace Inhibitors Cough   Egg Yolk Other (See Comments)    vomiting   Lisinopril Other (See Comments)   Adhesive [Tape] Rash    Patient Active Problem List   Diagnosis Date Noted   Morbid obesity (HCC) 07/19/2023   Benign ethnic neutropenia 01/01/2023   Chronic insomnia 12/01/2022   Aortic atherosclerosis (HCC) 11/26/2020   Diverticulosis 11/26/2020   Obesity, diabetes, and hypertension syndrome (HCC) 06/05/2020   Body mass index (BMI) of 40.1-44.9 in adult (HCC) 12/04/2019   Class 2 severe obesity due to excess calories with serious comorbidity and body mass index (BMI) of 35.0 to 35.9 in adult [facility]) 10/19/2019  Osteoarthritis of spine with radiculopathy, cervical region 11/30/2017   Arthritis 06/23/2014   Prediabetes 10/20/2013   Obesity (BMI 35.0-39.9 without comorbidity) 10/20/2013   Hypertension associated with diabetes [facility])     Past Medical History:  Diagnosis Date   Allergy    Arthritis    Diabetes mellitus without complication (HCC)    Diverticulitis    Hypertension    Urticaria     Past Surgical History:  Procedure Laterality Date   APPENDECTOMY     CHOLECYSTECTOMY     rotator cuff surgery Right 07/2014   TUBAL LIGATION  Dec. 1990     Social History   Socioeconomic History   Marital status: Married    Spouse name: Not on file   Number of children: 2   Years of education: Masters    Highest education level: Master's degree (e.g., MA, MS, MEng, MEd, MSW, MBA)  Occupational History   Occupation: Teacher  Tobacco Use   Smoking status: Never    Passive exposure: Never   Smokeless tobacco: Never  Vaping Use   Vaping status: Never Used  Substance and Sexual Activity   Alcohol use: Yes    Alcohol/week: 1.0 standard drink of alcohol    Types: 1 Glasses of wine per week    Comment: Wine about 1 glass or less per bi-weekly   Drug use: No   Sexual activity: Not Currently  Other Topics Concern   Not on file  Social History Narrative   Patient has had 2 pregnancies and has 2 living children.   Social Drivers of Corporate investment banker Strain: Low Risk  (10/11/2023)   Overall Financial Resource Strain (CARDIA)    Difficulty of Paying Living Expenses: Not hard at all  Food Insecurity: No Food Insecurity (10/11/2023)   Hunger Vital Sign    Worried About Running Out of Food in the Last Year: Never true    Ran Out of Food in the Last Year: Never true  Transportation Needs: No Transportation Needs (10/11/2023)   PRAPARE - Administrator, Civil Service (Medical): No    Lack of Transportation (Non-Medical): No  Physical Activity: Insufficiently Active (10/11/2023)   Exercise Vital Sign    Days of Exercise per Week: 4 days    Minutes of Exercise per Session: 30 min  Stress: No Stress Concern Present (10/11/2023)   [name] of Occupational Health - Occupational Stress Questionnaire    Feeling of Stress : Not at all  Social Connections: Socially Integrated (10/11/2023)   Social Connection and Isolation Panel [NHANES]    Frequency of Communication with Friends and Family: More than three times a week    Frequency of Social Gatherings with Friends and Family: More than three times a week    Attends  Religious Services: More than 4 times per year    Active Member of [employer] or Organizations: Yes    Attends Engineer, structural: More than 4 times per year    Marital Status: Married  Catering manager Violence: Not At Risk (03/23/2022)   Humiliation, Afraid, Rape, and Kick questionnaire    Fear of Current or Ex-Partner: No    Emotionally Abused: No    Physically Abused: No    Sexually Abused: No    Family History  Problem Relation Age of Onset   Diabetes Father    Hypertension Father    Arthritis Father    Breast cancer Maternal Aunt        62s  Cancer Maternal Grandmother        breast cancer   Breast cancer Maternal Grandmother    Sickle cell anemia Brother    Hypertension Brother    Cancer Brother 15       Leukemia, passed away at 19   Cancer Maternal Aunt    Cancer Maternal Aunt    Hypertension Brother      Review of Systems  Constitutional: Negative.  Negative for chills and fever.  HENT: Negative.  Negative for congestion and sore throat.   Respiratory: Negative.  Negative for cough and shortness of breath.   Cardiovascular: Negative.  Negative for chest pain and palpitations.  Gastrointestinal:  Negative for abdominal pain, diarrhea, nausea and vomiting.  Genitourinary: Negative.  Negative for dysuria and hematuria.  Skin: Negative.  Negative for rash.  Neurological: Negative.  Negative for dizziness and headaches.  All other systems reviewed and are negative.   Vitals:   10/18/23 1015  BP: 122/78  Pulse: 94  Temp: 97.8 F (36.6 C)  SpO2: 95%    Physical Exam Vitals reviewed.  Constitutional:      Appearance: Normal appearance.  HENT:     Head: Normocephalic.  Eyes:     Extraocular Movements: Extraocular movements intact.  Cardiovascular:     Rate and Rhythm: Normal rate and regular rhythm.     Pulses: Normal pulses.     Heart sounds: Normal heart sounds.  Pulmonary:     Effort: Pulmonary effort is normal.     Breath sounds: Normal  breath sounds.  Musculoskeletal:     Cervical back: No tenderness.  Lymphadenopathy:     Cervical: No cervical adenopathy.  Skin:    General: Skin is warm and dry.     Capillary Refill: Capillary refill takes less than 2 seconds.  Neurological:     General: No focal deficit present.     Mental Status: She is alert and oriented to person, place, and time.  Psychiatric:        Mood and Affect: Mood normal.        Behavior: Behavior normal.    Results for orders placed or performed in visit on 10/18/23 (from the past 24 hours)  POCT HgB A1C     Status: None   Collection Time: 10/18/23 10:34 AM  Result Value Ref Range   Hemoglobin A1C 5.5 4.0 - 5.6 %   HbA1c POC (<> result, manual entry)     HbA1c, POC (prediabetic range)     HbA1c, POC (controlled diabetic range)        ASSESSMENT & PLAN: A total of 43 minutes was spent with the patient and counseling/coordination of care regarding preparing for this visit, review of most recent office visit notes, review of multiple chronic medical conditions and their management, cardiovascular risks associated with hypertension and diabetes, review of all medications, review of most recent bloodwork results including interpretation of today's hemoglobin A1c, review of health maintenance items, education on nutrition, prognosis, documentation, and need for follow up.   Problem List Items Addressed This Visit       Cardiovascular and Mediastinum   Hypertension associated with diabetes (HCC) - Primary   Well-controlled hypertension with normal blood pressure readings at home Continue amlodipine 5 mg and losartan 25 mg daily Well-controlled diabetes with hemoglobin A1c of 5.5 Continue weekly Mounjaro 7.5 mg along with daily metformin 500 mg twice a day Eating better and losing weight [name] 3 months      Relevant Orders  POCT HgB A1C (Completed)   Obesity, diabetes, and hypertension syndrome (HCC)   Cardiovascular risk associated with  diabetes and hypertension discussed Diet and nutrition discussed Advised to decrease amount of daily carbohydrate intake and daily calories and increase amount of plant based protein in her diet Continue metformin 500 mg twice a day and weekly Mounjaro 7.5 mg Continue amlodipine 5 mg daily and losartan 25 mg      Aortic atherosclerosis (HCC)   Diet and nutrition discussed Continue rosuvastatin 10 mg daily        Other   Body mass index (BMI) of 40.1-44.9 in adult [facility])   Eating better and losing weight. Diet and nutrition discussed      Morbid obesity (HCC)   Eating better and losing weight Diet and nutrition discussed      Patient Instructions  Diabetes Mellitus and Nutrition, Adult When you have diabetes, or diabetes mellitus, it is very important to have healthy eating habits because your blood sugar (glucose) levels are greatly affected by what you eat and drink. Eating healthy foods in the right amounts, at about the same times every day, can help you: Manage your blood glucose. Lower your risk of heart disease. Improve your blood pressure. Reach or maintain a healthy weight. What can affect my meal plan? Every person with diabetes is different, and each person has different needs for a meal plan. Your health care provider may recommend that you work with a dietitian to make a meal plan that is best for you. Your meal plan may vary depending on factors such as: The calories you need. The medicines you take. Your weight. Your blood glucose, blood pressure, and cholesterol levels. Your activity level. Other health conditions you have, such as heart or kidney disease. How do carbohydrates affect me? Carbohydrates, also called carbs, affect your blood glucose level more than any other type of food. Eating carbs raises the amount of glucose in your blood. It is important to know how many carbs you can safely have in each meal. This is different for every person. Your  dietitian can help you calculate how many carbs you should have at each meal and for each snack. How does alcohol affect me? Alcohol can cause a decrease in blood glucose (hypoglycemia), especially if you use insulin or take certain diabetes medicines by mouth. Hypoglycemia can be a life-threatening condition. Symptoms of hypoglycemia, such as sleepiness, dizziness, and confusion, are similar to symptoms of having too much alcohol. Do not drink alcohol if: Your health care provider tells you not to drink. You are pregnant, may be pregnant, or are planning to become pregnant. If you drink alcohol: Limit how much you have to: 0-1 drink a day for women. 0-2 drinks a day for men. Know how much alcohol is in your drink. In the [place] one drink equals one 12 oz bottle of beer (355 mL), one 5 oz glass of wine (148 mL), or one 1 oz glass of hard liquor (44 mL). Keep yourself hydrated with water, diet soda, or unsweetened iced tea. Keep in mind that regular soda, juice, and other mixers may contain a lot of sugar and must be counted as carbs. What are tips for following this plan?  Reading food labels Start by checking the serving size on the Nutrition Facts label of packaged foods and drinks. The number of calories and the amount of carbs, fats, and other nutrients listed on the label are based on one serving of the item. Many  items contain more than one serving per package. Check the total grams (g) of carbs in one serving. Check the number of grams of saturated fats and trans fats in one serving. Choose foods that have a low amount or none of these fats. Check the number of milligrams (mg) of salt (sodium) in one serving. Most people should limit total sodium intake to less than 2,300 mg per day. Always check the nutrition information of foods labeled as "low-fat" or "nonfat." These foods may be higher in added sugar or refined carbs and should be avoided. Talk to your dietitian to identify your daily  goals for nutrients listed on the label. Shopping Avoid buying canned, pre-made, or processed foods. These foods tend to be high in fat, sodium, and added sugar. Shop around the outside edge of the grocery store. This is where you will most often find fresh fruits and vegetables, bulk grains, fresh meats, and fresh dairy products. Cooking Use low-heat cooking methods, such as baking, instead of high-heat cooking methods, such as deep frying. Cook using healthy oils, such as olive, canola, or sunflower oil. Avoid cooking with butter, cream, or high-fat meats. Meal planning Eat meals and snacks regularly, preferably at the same times every day. Avoid going long periods of time without eating. Eat foods that are high in fiber, such as fresh fruits, vegetables, beans, and whole grains. Eat 4-6 oz (112-168 g) of lean protein each day, such as lean meat, chicken, fish, eggs, or tofu. One ounce (oz) (28 g) of lean protein is equal to: 1 oz (28 g) of meat, chicken, or fish. 1 egg.  cup (62 g) of tofu. Eat some foods each day that contain healthy fats, such as avocado, nuts, seeds, and fish. What foods should I eat? Fruits Berries. Apples. Oranges. Peaches. Apricots. Plums. Grapes. Mangoes. Papayas. Pomegranates. Kiwi. Cherries. Vegetables Leafy greens, including lettuce, spinach, kale, chard, collard greens, mustard greens, and cabbage. Beets. Cauliflower. Broccoli. Carrots. Green beans. Tomatoes. Peppers. Onions. Cucumbers. Brussels sprouts. Grains Whole grains, such as whole-wheat or whole-grain bread, crackers, tortillas, cereal, and pasta. Unsweetened oatmeal. Quinoa. Brown or wild rice. Meats and other proteins Seafood. Poultry without skin. Lean cuts of poultry and beef. Tofu. Nuts. Seeds. Dairy Low-fat or fat-free dairy products such as milk, yogurt, and cheese. The items listed above may not be a complete list of foods and beverages you can eat and drink. Contact a dietitian for more  information. What foods should I avoid? Fruits Fruits canned with syrup. Vegetables Canned vegetables. Frozen vegetables with butter or cream sauce. Grains Refined white flour and flour products such as bread, pasta, snack foods, and cereals. Avoid all processed foods. Meats and other proteins Fatty cuts of meat. Poultry with skin. Breaded or fried meats. Processed meat. Avoid saturated fats. Dairy Full-fat yogurt, cheese, or milk. Beverages Sweetened drinks, such as soda or iced tea. The items listed above may not be a complete list of foods and beverages you should avoid. Contact a dietitian for more information. Questions to ask a health care provider Do I need to meet with a certified diabetes care and education specialist? Do I need to meet with a dietitian? What number can I call if I have questions? When are the best times to check my blood glucose? Where to find more information: American Diabetes Association: diabetes.org Academy of Nutrition and Dietetics: eatright.[employer] of Diabetes and Digestive and Kidney Diseases: StageSync.si Association of Diabetes Care & Education Specialists: diabeteseducator.org Summary It is important  to have healthy eating habits because your blood sugar (glucose) levels are greatly affected by what you eat and drink. It is important to use alcohol carefully. A healthy meal plan will help you manage your blood glucose and lower your risk of heart disease. Your health care provider may recommend that you work with a dietitian to make a meal plan that is best for you. This information is not intended to replace advice given to you by your health care provider. Make sure you discuss any questions you have with your health care provider. Document Revised: 04/16/2020 Document Reviewed: 04/17/2020 Elsevier Patient Education  2024 Elsevier Inc.     Edwina Barth, MD [facility]

## 2023-10-18 NOTE — Assessment & Plan Note (Signed)
Eating better and losing weight Diet and nutrition discussed

## 2023-10-18 NOTE — Assessment & Plan Note (Signed)
Diet and nutrition discussed. Continue rosuvastatin 10 mg daily.  

## 2023-10-18 NOTE — Assessment & Plan Note (Signed)
Cardiovascular risk associated with diabetes and hypertension discussed Diet and nutrition discussed Advised to decrease amount of daily carbohydrate intake and daily calories and increase amount of plant based protein in her diet Continue metformin 500 mg twice a day and weekly Mounjaro 7.5 mg Continue amlodipine 5 mg daily and losartan 25 mg

## 2023-11-17 ENCOUNTER — Telehealth: Payer: Self-pay | Admitting: Emergency Medicine

## 2023-11-17 NOTE — Telephone Encounter (Signed)
Copied from CRM [id number]. Topic: Clinical - Prescription Issue >> Nov 17, 2023  8:13 AM Kathryne Eriksson wrote: Reason for CRM: tirzepatide [facility]) 7.5 MG/0.5ML Pen >> Nov 17, 2023  8:15 AM Kathryne Eriksson wrote: Patient states she's called in numerous of times about the doage of her tirzepatide [facility]) 7.5 MG/0.5ML Pen , patient states it's suppose to go up to 10MG . Patient states it's time for her refill and the corrections hasn't been made. Patient is requesting a call back [phone]

## 2023-11-26 ENCOUNTER — Telehealth: Payer: Self-pay

## 2023-11-26 NOTE — Telephone Encounter (Signed)
 Copied from CRM [id number]. Topic: Clinical - Prescription Issue >> Nov 26, 2023  2:47 PM Katherine [name] wrote: Reason for CRM: Patient is requesting Dr. Alvy Bimler to send ell of her prescriptions to: [facility] [id number] [place] [address] Phone: [phone] Fax: [phone] Hours: M-F 7:30am-6pm  Patient is also calling as a reminder for Dr. Alvy Bimler to fill her 10 MG Mounjaro as she is due for her next dose on Monday 3/3 but is out of the medication.

## 2023-11-29 ENCOUNTER — Other Ambulatory Visit (HOSPITAL_COMMUNITY): Payer: Self-pay

## 2023-11-29 ENCOUNTER — Telehealth: Payer: Self-pay | Admitting: Emergency Medicine

## 2023-11-29 ENCOUNTER — Other Ambulatory Visit: Payer: Self-pay | Admitting: Radiology

## 2023-11-29 DIAGNOSIS — E1159 Type 2 diabetes mellitus with other circulatory complications: Secondary | ICD-10-CM

## 2023-11-29 MED ORDER — TIRZEPATIDE 7.5 MG/0.5ML ~~LOC~~ SOAJ
7.5000 mg | SUBCUTANEOUS | 0 refills | Status: DC
  Filled 2023-11-29: qty 6, 84d supply, fill #0

## 2023-11-29 NOTE — Telephone Encounter (Signed)
 Copied from CRM [id number]. Topic: General - Other >> Nov 29, 2023 12:07 PM Truddie Crumble wrote: Reason for CRM: patient called stating she is checking on her mounjaro and other medication that was suppose to be have sent to [facility]. Patient would like a call back

## 2023-11-30 ENCOUNTER — Other Ambulatory Visit (HOSPITAL_COMMUNITY): Payer: Self-pay

## 2023-11-30 ENCOUNTER — Other Ambulatory Visit: Payer: Self-pay | Admitting: Radiology

## 2023-11-30 DIAGNOSIS — I152 Hypertension secondary to endocrine disorders: Secondary | ICD-10-CM

## 2023-11-30 MED ORDER — TIRZEPATIDE 7.5 MG/0.5ML ~~LOC~~ SOAJ
7.5000 mg | SUBCUTANEOUS | 0 refills | Status: DC
  Filled 2023-11-30: qty 6, 84d supply, fill #0

## 2023-11-30 NOTE — Telephone Encounter (Signed)
 Patient has been called and notified of this.  Patient did also ask about her requested zofran patches for her cruise states she had discussed this with Dr Alvy Bimler last visit, I requested that patient call closer to the time of the trip as it is not until June. Patient was happy with this and expressed gratitude for the quick turn around

## 2023-11-30 NOTE — Telephone Encounter (Signed)
 Mounjaro 7.5mg  sent as refill yesterday, patient was under the impression this would be increased to 10 mg dose this refill. Please advise if dose should remain the same or be increased at this time.

## 2023-11-30 NOTE — Telephone Encounter (Signed)
 She should remain on Mounjaro 7.5 mg.  Thanks

## 2023-11-30 NOTE — Telephone Encounter (Signed)
 Patient is calling back because patient says doctor said it would be 10 instead of 7.5 . The pharmacy told her to check before she came and picked it up. Patient is needing to know if the 7.5 is correct or is it suppose to be 10. If it suppose to be 10 then they need to send a new refill to pharmacy . Cause patient says she is suppose to take shot today

## 2023-12-01 ENCOUNTER — Other Ambulatory Visit (HOSPITAL_COMMUNITY): Payer: Self-pay

## 2023-12-01 MED ORDER — LOSARTAN POTASSIUM 25 MG PO TABS
25.0000 mg | ORAL_TABLET | Freq: Every day | ORAL | 2 refills | Status: DC
  Filled 2023-12-01: qty 90, 90d supply, fill #0

## 2023-12-01 MED FILL — Metformin HCl Tab 500 MG: ORAL | 90 days supply | Qty: 180 | Fill #0 | Status: AC

## 2023-12-01 MED FILL — Rosuvastatin Calcium Tab 10 MG: ORAL | 90 days supply | Qty: 90 | Fill #0 | Status: AC

## 2023-12-01 MED FILL — Amlodipine Besylate Tab 5 MG (Base Equivalent): ORAL | 90 days supply | Qty: 90 | Fill #0 | Status: AC

## 2024-01-11 ENCOUNTER — Ambulatory Visit

## 2024-01-11 VITALS — Ht 64.0 in | Wt 227.0 lb

## 2024-01-11 DIAGNOSIS — Z0000 Encounter for general adult medical examination without abnormal findings: Secondary | ICD-10-CM | POA: Diagnosis not present

## 2024-01-11 NOTE — Patient Instructions (Addendum)
 Ms. Soller , Thank you for taking time to come for your Medicare Wellness Visit. I appreciate your ongoing commitment to your health goals. Please review the following plan we discussed and let me know if I can assist you in the future.   Referrals/Orders/Follow-Ups/Clinician Recommendations: It was nice talking with you today.  Remember to ask about the Advance directive package during your up coming visit with provider.  Keep up the good work.    This is a list of the screening recommended for you and due dates:  Health Maintenance  Topic Date Due   Medicare Annual Wellness Visit  03/24/2023   COVID-19 Vaccine (8 - Pfizer risk 2024-25 season) 11/30/2023   Hemoglobin A1C  04/16/2024   Flu Shot  04/28/2024   Yearly kidney function blood test for diabetes  06/06/2024   Yearly kidney health urinalysis for diabetes  06/06/2024   Complete foot exam   06/06/2024   Eye exam for diabetics  09/13/2024   Mammogram  04/15/2025   Colon Cancer Screening  06/13/2027   DTaP/Tdap/Td vaccine (3 - Td or Tdap) 06/25/2031   Pneumonia Vaccine  Completed   DEXA scan (bone density measurement)  Completed   Hepatitis C Screening  Completed   Zoster (Shingles) Vaccine  Completed   HPV Vaccine  Aged Out   Meningitis B Vaccine  Aged Out    Advanced directives: (Provided) Advance directive discussed with you today. I have provided a copy for you to complete at home and have notarized. Once this is complete, please bring a copy in to our office so we can scan it into your chart.   Next Medicare Annual Wellness Visit scheduled for next year: Yes

## 2024-01-11 NOTE — Progress Notes (Unsigned)
 Subjective:   Katherine [name] is a 68 y.o. who presents for a Medicare Wellness preventive visit.  Visit Complete: Virtual I connected with  Katherine [name] on 01/11/24 by a video and audio enabled telemedicine application and verified that I am speaking with the correct person using two identifiers.  Patient Location: Home  Provider Location: Home Office  I discussed the limitations of evaluation and management by telemedicine. The patient expressed understanding and agreed to proceed.  Vital Signs: Because this visit was a virtual/telehealth visit, some criteria may be missing or patient reported. Any vitals not documented were not able to be obtained and vitals that have been documented are patient reported.   Persons Participating in Visit: Patient.  AWV Questionnaire: No: Patient Medicare AWV questionnaire was not completed prior to this visit.  Cardiac Risk Factors include: advanced age (>59men, >17 women);hypertension;diabetes mellitus;obesity (BMI >30kg/m2)     Objective:    Today's Vitals   01/11/24 1531  Weight: 227 lb (103 kg)  Height: 5\' 4"  (1.626 m)   Body mass index is 38.96 kg/m.     01/11/2024    3:44 PM 03/23/2022   10:30 AM 12/19/2019    2:12 PM 01/21/2017    2:52 PM 07/06/2014   10:34 AM  Advanced Directives  Does Patient Have a Medical Advance Directive? No No No No No  Would patient like information on creating a medical advance directive? Yes (MAU/Ambulatory/Procedural Areas - Information given) No - Patient declined Yes (MAU/Ambulatory/Procedural Areas - Information given)  No - patient declined information    Current Medications (verified) Outpatient Encounter Medications as of 01/11/2024  Medication Sig   ACCU-CHEK GUIDE test strip TEST BLOOD GLUCOSE FOUR TIMES DAILY AS DIRECTED   Accu-Chek Softclix Lancets lancets TEST BLOOD GLUCOSE FOUR TIMES DAILY AS DIRECTED   Alcohol Swabs (DROPSAFE ALCOHOL PREP) 70 % PADS USE AS DIRECTED FOUR TIMES  DAILY   amLODipine (NORVASC) 5 MG tablet Take 1 tablet (5 mg total) by mouth daily.   aspirin EC 81 MG tablet Take 81 mg by mouth daily.   blood glucose meter kit and supplies KIT Dispense based on patient and insurance preference. Use up to four times daily as directed. (FOR ICD-9 250.00, 250.01).   Calcium Carb-Cholecalciferol (CALCIUM 600-D PO) Take by mouth.   Cholecalciferol (VITAMIN D3 PO) Take by mouth.   COMIRNATY syringe Inject 0.3 mLs into the muscle once.   FLUAD 0.5 ML injection Inject 0.5 mLs into the muscle once.   losartan (COZAAR) 25 MG tablet Take 1 tablet (25 mg total) by mouth daily.   metFORMIN (GLUCOPHAGE) 500 MG tablet Take 1 tablet (500 mg total) by mouth 2 (two) times daily with a meal.   Omega-3 Fatty Acids (OMEGA-3 2100 PO) Take by mouth daily.   OVER THE COUNTER MEDICATION 1,950 mg daily.   rosuvastatin (CRESTOR) 10 MG tablet Take 1 tablet (10 mg total) by mouth daily.   tirzepatide (MOUNJARO) 7.5 MG/0.5ML Pen Inject 7.5 mg into the skin once a week.   vitamin B-12 (CYANOCOBALAMIN) 1000 MCG tablet Take 1,000 mcg by mouth daily.   losartan (COZAAR) 25 MG tablet Take 1 tablet (25 mg total) by mouth daily.   No facility-administered encounter medications on file as of 01/11/2024.    Allergies (verified) Ace inhibitors, Egg yolk, Lisinopril, and Adhesive [tape]   History: Past Medical History:  Diagnosis Date   Allergy    Arthritis    Diabetes mellitus without complication (HCC)  Diverticulitis    Hypertension    Urticaria    Past Surgical History:  Procedure Laterality Date   APPENDECTOMY     CHOLECYSTECTOMY     rotator cuff surgery Right 07/2014   TUBAL LIGATION  Dec. 1990   Family History  Problem Relation Age of Onset   Diabetes Father    Hypertension Father    Arthritis Father    Breast cancer Maternal Aunt        18s   Cancer Maternal Grandmother        breast cancer   Breast cancer Maternal Grandmother    Sickle cell anemia Brother     Hypertension Brother    Cancer Brother 15       Leukemia, passed away at 50   Cancer Maternal Aunt    Cancer Maternal Aunt    Hypertension Brother    Social History   Socioeconomic History   Marital status: Married    Spouse name: Katherine [name]   Number of children: 2   Years of education: Masters    Highest education level: Master's degree (e.g., MA, MS, MEng, MEd, MSW, MBA)  Occupational History   Occupation: RETIRED/Teacher  Tobacco Use   Smoking status: Never    Passive exposure: Never   Smokeless tobacco: Never  Vaping Use   Vaping status: Never Used  Substance and Sexual Activity   Alcohol use: Yes    Alcohol/week: 1.0 standard drink of alcohol    Types: 1 Glasses of wine per week    Comment: Wine about 1 glass or less per bi-weekly   Drug use: No   Sexual activity: Not Currently  Other Topics Concern   Not on file  Social History Narrative   Patient lives with husband/2025   Social Drivers of Health   Financial Resource Strain: Low Risk  (10/11/2023)   Overall Financial Resource Strain (CARDIA)    Difficulty of Paying Living Expenses: Not hard at all  Food Insecurity: No Food Insecurity (10/11/2023)   Hunger Vital Sign    Worried About Running Out of Food in the Last Year: Never true    Ran Out of Food in the Last Year: Never true  Transportation Needs: No Transportation Needs (10/11/2023)   PRAPARE - Administrator, Civil Service (Medical): No    Lack of Transportation (Non-Medical): No  Physical Activity: Insufficiently Active (10/11/2023)   Exercise Vital Sign    Days of Exercise per Week: 4 days    Minutes of Exercise per Session: 30 min  Stress: No Stress Concern Present (10/11/2023)   [name] of Occupational Health - Occupational Stress Questionnaire    Feeling of Stress : Not at all  Social Connections: Socially Integrated (10/11/2023)   Social Connection and Isolation Panel [NHANES]    Frequency of Communication with Friends and Family:  More than three times a week    Frequency of Social Gatherings with Friends and Family: More than three times a week    Attends Religious Services: More than 4 times per year    Active Member of [employer] or Organizations: Yes    Attends Engineer, structural: More than 4 times per year    Marital Status: Married    Tobacco Counseling Counseling given: Not Answered    Clinical Intake:  Pre-visit preparation completed: Yes  Pain : No/denies pain     BMI - recorded: 38.96 Nutritional Status: BMI > 30  Obese Nutritional Risks: None Diabetes: Yes CBG done?: Yes (107  per pt) CBG resulted in Enter/ Edit results?: No Did pt. bring in CBG monitor from home?: No  Lab Results  Component Value Date   HGBA1C 5.5 10/18/2023   HGBA1C 6.8 (H) 06/07/2023   HGBA1C 6.3 12/01/2022     How often do you need to have someone help you when you read instructions, pamphlets, or other written materials from your doctor or pharmacy?: 1 - Never  Interpreter Needed?: No  Information entered by :: Ranika Mcniel, RMA   Activities of Daily Living     01/11/2024    3:32 PM  In your present state of health, do you have any difficulty performing the following activities:  Hearing? 0  Vision? 0  Difficulty concentrating or making decisions? 0  Walking or climbing stairs? 0  Dressing or bathing? 0  Doing errands, shopping? 0  Preparing Food and eating ? N  Using the Toilet? N  In the past six months, have you accidently leaked urine? N  Do you have problems with loss of bowel control? N  Managing your Medications? N  Managing your Finances? N  Housekeeping or managing your Housekeeping? N    Patient Care Team: Elvira Hammersmith, MD as PCP - General (Internal Medicine) Osa Blase, MD as Consulting Physician (Orthopedic Surgery) Tami Falcon, MD as Consulting Physician (Gastroenterology)  Indicate any recent Medical Services you may have received from other than [facility] providers  in the past year (date may be approximate).     Assessment:   This is a routine wellness examination for Raygen.  Hearing/Vision screen Hearing Screening - Comments:: Denies hearing difficulties   Vision Screening - Comments:: Wears eyeglasses   Goals Addressed             This Visit's Progress    To maintain my current health status by continuing to eat healthy, stay physically active and socially active.   On track      Depression Screen     10/18/2023   10:19 AM 08/23/2023   10:41 AM 12/01/2022    9:40 AM 03/23/2022    9:01 AM 12/22/2021    9:04 AM 06/24/2021    9:00 AM 02/26/2021   10:07 AM  PHQ 2/9 Scores  PHQ - 2 Score 0 0 0 0 0 0 0    Fall Risk     01/11/2024    3:45 PM 10/18/2023   10:19 AM 08/23/2023   10:41 AM 12/01/2022    9:40 AM 03/23/2022    9:01 AM  Fall Risk   Falls in the past year? 0 0 0 0 0  Number falls in past yr: 0 0 0 0 0  Injury with Fall? 0 0 0 0 0  Risk for fall due to : No Fall Risks No Fall Risks No Fall Risks No Fall Risks   Follow up Falls prevention discussed;Falls evaluation completed Falls evaluation completed Falls evaluation completed Falls evaluation completed     MEDICARE RISK AT HOME:     TIMED UP AND GO:  Was the test performed?  No  Cognitive Function: Declined: Patient declined cognitive screening, but was able to answer questions in an accurate and timely manner. No cognitive impairments observed.        03/23/2022   10:32 AM  6CIT Screen  What Year? 0 points  What month? 0 points  What time? 0 points  Count back from 20 0 points  Months in reverse 0 points  Repeat phrase 0 points  Total  Score 0 points    Immunizations Immunization History  Administered Date(s) Administered   Fluad Quad(high Dose 65+) 06/02/2023   Influenza Split 07/08/2015, 06/30/2017   Influenza,inj,Quad PF,6+ Mos 06/23/2014, 07/23/2016, 06/20/2018, 06/06/2019, 06/05/2020, 06/24/2021, 06/03/2022   PFIZER(Purple Top)SARS-COV-2 Vaccination  10/24/2019, 11/14/2019, 07/03/2020   PPD Test 06/03/2022   Pfizer Covid-19 Vaccine Bivalent Booster 1yrs & up 03/12/2022   Pneumococcal Conjugate-13 06/06/2019   Pneumococcal Polysaccharide-23 08/08/2015, 11/26/2020   Tdap 06/15/2011, 06/24/2021   Unspecified SARS-COV-2 Vaccination 01/07/2021, 07/03/2021, 06/02/2023   Zoster Recombinant(Shingrix) 06/05/2020, 11/26/2020   Zoster, Live 09/29/2011    Screening Tests Health Maintenance  Topic Date Due   COVID-19 Vaccine (8 - Pfizer risk 2024-25 season) 11/30/2023   HEMOGLOBIN A1C  04/16/2024   INFLUENZA VACCINE  04/28/2024   Diabetic kidney evaluation - eGFR measurement  06/06/2024   Diabetic kidney evaluation - Urine ACR  06/06/2024   FOOT EXAM  06/06/2024   OPHTHALMOLOGY EXAM  09/13/2024   Medicare Annual Wellness (AWV)  01/10/2025   MAMMOGRAM  04/15/2025   Colonoscopy  06/13/2027   DTaP/Tdap/Td (3 - Td or Tdap) 06/25/2031   Pneumonia Vaccine 12+ Years old  Completed   DEXA SCAN  Completed   Hepatitis C Screening  Completed   Zoster Vaccines- Shingrix  Completed   HPV VACCINES  Aged Out   Meningococcal B Vaccine  Aged Out    Health Maintenance  Health Maintenance Due  Topic Date Due   COVID-19 Vaccine (8 - Pfizer risk 2024-25 season) 11/30/2023   Health Maintenance Items Addressed: See Nurse Notes  Additional Screening:  Vision Screening: Recommended annual ophthalmology exams for early detection of glaucoma and other disorders of the eye.  Dental Screening: Recommended annual dental exams for proper oral hygiene  Community Resource Referral / Chronic Care Management: CRR required this visit?  No   CCM required this visit?  No     Plan:     I have personally reviewed and noted the following in the patient's chart:   Medical and social history Use of alcohol, tobacco or illicit drugs  Current medications and supplements including opioid prescriptions. Patient is not currently taking opioid  prescriptions. Functional ability and status Nutritional status Physical activity Advanced directives List of other physicians Hospitalizations, surgeries, and ER visits in previous 12 months Vitals Screenings to include cognitive, depression, and falls Referrals and appointments  In addition, I have reviewed and discussed with patient certain preventive protocols, quality metrics, and best practice recommendations. A written personalized care plan for preventive services as well as general preventive health recommendations were provided to patient.     Katalaya Beel L Trashawn Oquendo, CMA   01/11/2024   After Visit Summary: (MyChart) Due to this being a telephonic visit, the after visit summary with patients personalized plan was offered to patient via MyChart   Notes: Nothing significant to report at this time.

## 2024-01-17 ENCOUNTER — Other Ambulatory Visit (HOSPITAL_COMMUNITY): Payer: Self-pay

## 2024-01-17 ENCOUNTER — Encounter: Payer: Self-pay | Admitting: Emergency Medicine

## 2024-01-17 ENCOUNTER — Ambulatory Visit: Payer: Medicare PPO | Admitting: Emergency Medicine

## 2024-01-17 VITALS — BP 118/80 | HR 75 | Temp 97.8°F | Ht 64.0 in | Wt 221.4 lb

## 2024-01-17 DIAGNOSIS — E1159 Type 2 diabetes mellitus with other circulatory complications: Secondary | ICD-10-CM

## 2024-01-17 DIAGNOSIS — E1169 Type 2 diabetes mellitus with other specified complication: Secondary | ICD-10-CM

## 2024-01-17 DIAGNOSIS — I152 Hypertension secondary to endocrine disorders: Secondary | ICD-10-CM

## 2024-01-17 DIAGNOSIS — E669 Obesity, unspecified: Secondary | ICD-10-CM

## 2024-01-17 DIAGNOSIS — Z7985 Long-term (current) use of injectable non-insulin antidiabetic drugs: Secondary | ICD-10-CM | POA: Diagnosis not present

## 2024-01-17 DIAGNOSIS — Z7984 Long term (current) use of oral hypoglycemic drugs: Secondary | ICD-10-CM | POA: Diagnosis not present

## 2024-01-17 LAB — COMPREHENSIVE METABOLIC PANEL WITH GFR
ALT: 11 U/L (ref 0–35)
AST: 14 U/L (ref 0–37)
Albumin: 4.6 g/dL (ref 3.5–5.2)
Alkaline Phosphatase: 62 U/L (ref 39–117)
BUN: 14 mg/dL (ref 6–23)
CO2: 30 meq/L (ref 19–32)
Calcium: 9.7 mg/dL (ref 8.4–10.5)
Chloride: 105 meq/L (ref 96–112)
Creatinine, Ser: 0.87 mg/dL (ref 0.40–1.20)
GFR: 67.81 mL/min (ref >60.00)
Glucose, Bld: 92 mg/dL (ref 70–99)
Potassium: 4.3 meq/L (ref 3.5–5.1)
Sodium: 141 meq/L (ref 135–145)
Total Bilirubin: 0.6 mg/dL (ref 0.2–1.2)
Total Protein: 8 g/dL (ref 6.0–8.3)

## 2024-01-17 LAB — LIPID PANEL
Cholesterol: 102 mg/dL (ref 0–200)
HDL: 50.1 mg/dL (ref >39.00)
LDL Cholesterol: 38 mg/dL (ref 0–99)
NonHDL: 51.5
Total CHOL/HDL Ratio: 2
Triglycerides: 66 mg/dL (ref 0.0–149.0)
VLDL: 13.2 mg/dL (ref 0.0–40.0)

## 2024-01-17 LAB — MICROALBUMIN / CREATININE URINE RATIO
Creatinine,U: 260.2 mg/dL
Microalb Creat Ratio: 5.9 mg/g (ref 0.0–30.0)
Microalb, Ur: 1.5 mg/dL (ref 0.0–1.9)

## 2024-01-17 LAB — CBC WITH DIFFERENTIAL/PLATELET
Basophils Absolute: 0 10*3/uL (ref 0.0–0.1)
Basophils Relative: 1 % (ref 0.0–3.0)
Eosinophils Absolute: 0.1 10*3/uL (ref 0.0–0.7)
Eosinophils Relative: 1.9 % (ref 0.0–5.0)
HCT: 41.6 % (ref 36.0–46.0)
Hemoglobin: 14.2 g/dL (ref 12.0–15.0)
Lymphocytes Relative: 63.6 % — ABNORMAL HIGH (ref 12.0–46.0)
Lymphs Abs: 2.4 10*3/uL (ref 0.7–4.0)
MCHC: 34.1 g/dL (ref 30.0–36.0)
MCV: 84 fl (ref 78.0–100.0)
Monocytes Absolute: 0.2 10*3/uL (ref 0.1–1.0)
Monocytes Relative: 6.2 % (ref 3.0–12.0)
Neutro Abs: 1 10*3/uL — ABNORMAL LOW (ref 1.4–7.7)
Neutrophils Relative %: 27.3 % — ABNORMAL LOW (ref 43.0–77.0)
Platelets: 242 10*3/uL (ref 150.0–400.0)
RBC: 4.96 Mil/uL (ref 3.87–5.11)
RDW: 14.3 % (ref 11.5–15.5)
WBC: 3.7 10*3/uL — ABNORMAL LOW (ref 4.0–10.5)

## 2024-01-17 LAB — HEMOGLOBIN A1C: Hgb A1c MFr Bld: 5.7 % (ref 4.6–6.5)

## 2024-01-17 MED ORDER — TIRZEPATIDE 10 MG/0.5ML ~~LOC~~ SOAJ
10.0000 mg | SUBCUTANEOUS | 5 refills | Status: AC
  Filled 2024-01-17: qty 6, 84d supply, fill #0
  Filled 2024-04-10: qty 6, 84d supply, fill #1
  Filled 2024-06-30: qty 6, 84d supply, fill #2
  Filled 2024-08-14 – 2024-09-22 (×2): qty 6, 84d supply, fill #3

## 2024-01-17 NOTE — Assessment & Plan Note (Signed)
 Cardiovascular risk associated with diabetes and hypertension discussed Diet and nutrition discussed Advised to decrease amount of daily carbohydrate intake and daily calories and increase amount of plant based protein in her diet Continue metformin  500 mg twice a day and weekly Mounjaro  10 mg Continue amlodipine  5 mg daily and losartan  25 mg

## 2024-01-17 NOTE — Progress Notes (Signed)
 Katherine [name] 68 y.o.   Chief Complaint  Patient presents with   Medical Management of Chronic Issues    3 month follow up, motion sickness patch, weight loss medication     HISTORY OF PRESENT ILLNESS: This is a 68 y.o. female A1A here for 55-month follow-up of chronic medical conditions including hypertension and diabetes Overall doing well.  Has no complaints or medical concerns today. Wt Readings from Last 3 Encounters:  01/17/24 221 lb 6 oz (100.4 kg)  01/11/24 227 lb (103 kg)  10/18/23 227 lb (103 kg)     HPI   Prior to Admission medications   Medication Sig Start Date End Date Taking? Authorizing Provider  ACCU-CHEK GUIDE test strip TEST BLOOD GLUCOSE FOUR TIMES DAILY AS DIRECTED 03/31/23  Yes Akshaya Toepfer Jose, MD  Accu-Chek Softclix Lancets lancets TEST BLOOD GLUCOSE FOUR TIMES DAILY AS DIRECTED 03/31/23  Yes Mahiya Kercheval, Isidro Margo, MD  Alcohol Swabs (DROPSAFE ALCOHOL PREP) 70 % PADS USE AS DIRECTED FOUR TIMES DAILY 03/31/23  Yes Isaiah Cianci, Isidro Margo, MD  amLODipine  (NORVASC ) 5 MG tablet Take 1 tablet (5 mg total) by mouth daily. 08/18/23  Yes Elvira Hammersmith, MD  aspirin EC 81 MG tablet Take 81 mg by mouth daily.   Yes [provider]  blood glucose meter kit and supplies KIT Dispense based on patient and insurance preference. Use up to four times daily as directed. (FOR ICD-9 250.00, 250.01). 06/20/18  Yes Efraim Vanallen, Isidro Margo, MD  Calcium  Carb-Cholecalciferol (CALCIUM  600-D PO) Take by mouth.   Yes [provider]  Cholecalciferol (VITAMIN D3 PO) Take by mouth.   Yes [provider]  COMIRNATY syringe Inject 0.3 mLs into the muscle once. 06/02/23  Yes [provider]  FLUAD 0.5 ML injection Inject 0.5 mLs into the muscle once. 06/02/23  Yes [provider]  losartan  (COZAAR ) 25 MG tablet Take 1 tablet (25 mg total) by mouth daily. 07/19/23  Yes Shaianne Nucci, Isidro Margo, MD  metFORMIN  (GLUCOPHAGE ) 500 MG tablet Take 1 tablet  (500 mg total) by mouth 2 (two) times daily with a meal. 08/25/23  Yes Kaisen Ackers, Isidro Margo, MD  Omega-3 Fatty Acids (OMEGA-3 2100 PO) Take by mouth daily.   Yes [provider]  OVER THE COUNTER MEDICATION 1,950 mg daily.   Yes [provider]  rosuvastatin  (CRESTOR ) 10 MG tablet Take 1 tablet (10 mg total) by mouth daily. 08/18/23  Yes Daeveon Zweber, Isidro Margo, MD  tirzepatide  (MOUNJARO ) 7.5 MG/0.5ML Pen Inject 7.5 mg into the skin once a week. 11/30/23  Yes Saron Tweed Jose, MD  vitamin B-12 (CYANOCOBALAMIN ) 1000 MCG tablet Take 1,000 mcg by mouth daily.   Yes [provider]    Allergies  Allergen Reactions   Ace Inhibitors Cough   Egg Yolk Other (See Comments)    vomiting   Lisinopril Other (See Comments)   Adhesive [Tape] Rash    Patient Active Problem List   Diagnosis Date Noted   Morbid obesity (HCC) 07/19/2023   Benign ethnic neutropenia 01/01/2023   Chronic insomnia 12/01/2022   Aortic atherosclerosis (HCC) 11/26/2020   Diverticulosis 11/26/2020   Obesity, diabetes, and hypertension syndrome (HCC) 06/05/2020   Body mass index (BMI) of 40.1-44.9 in adult (HCC) 12/04/2019   Class 2 severe obesity due to excess calories with serious comorbidity and body mass index (BMI) of 35.0 to 35.9 in adult [facility]) 10/19/2019   Osteoarthritis of spine with radiculopathy, cervical region 11/30/2017   Arthritis 06/23/2014   Prediabetes  10/20/2013   Obesity (BMI 35.0-39.9 without comorbidity) 10/20/2013   Hypertension associated with diabetes [facility])     Past Medical History:  Diagnosis Date   Allergy    Arthritis    Diabetes mellitus without complication (HCC)    Diverticulitis    Hypertension    Urticaria     Past Surgical History:  Procedure Laterality Date   APPENDECTOMY     CHOLECYSTECTOMY     rotator cuff surgery Right 07/2014   TUBAL LIGATION  Dec. 1990    Social History   Socioeconomic History   Marital status: Married    Spouse name:  REmbert   Number of children: 2   Years of education: Masters    Highest education level: Master's degree (e.g., MA, MS, MEng, MEd, MSW, MBA)  Occupational History   Occupation: RETIRED/Teacher  Tobacco Use   Smoking status: Never    Passive exposure: Never   Smokeless tobacco: Never  Vaping Use   Vaping status: Never Used  Substance and Sexual Activity   Alcohol use: Yes    Alcohol/week: 1.0 standard drink of alcohol    Types: 1 Glasses of wine per week    Comment: Wine about 1 glass or less per bi-weekly   Drug use: No   Sexual activity: Not Currently  Other Topics Concern   Not on file  Social History Narrative   Patient lives with husband/2025   Social Drivers of Health   Financial Resource Strain: Medium Risk (01/11/2024)   Overall Financial Resource Strain (CARDIA)    Difficulty of Paying Living Expenses: Somewhat hard  Food Insecurity: No Food Insecurity (01/11/2024)   Hunger Vital Sign    Worried About Running Out of Food in the Last Year: Never true    Ran Out of Food in the Last Year: Never true  Transportation Needs: No Transportation Needs (01/11/2024)   PRAPARE - Administrator, Civil Service (Medical): No    Lack of Transportation (Non-Medical): No  Physical Activity: Sufficiently Active (01/11/2024)   Exercise Vital Sign    Days of Exercise per Week: 5 days    Minutes of Exercise per Session: 30 min  Stress: No Stress Concern Present (01/11/2024)   [name] of Occupational Health - Occupational Stress Questionnaire    Feeling of Stress : Not at all  Social Connections: Socially Integrated (01/11/2024)   Social Connection and Isolation Panel [NHANES]    Frequency of Communication with Friends and Family: More than three times a week    Frequency of Social Gatherings with Friends and Family: Twice a week    Attends Religious Services: More than 4 times per year    Active Member of [employer] or Organizations: Yes    Attends Banker  Meetings: Never    Marital Status: Married  Catering manager Violence: Not At Risk (01/11/2024)   Humiliation, Afraid, Rape, and Kick questionnaire    Fear of Current or Ex-Partner: No    Emotionally Abused: No    Physically Abused: No    Sexually Abused: No    Family History  Problem Relation Age of Onset   Diabetes Father    Hypertension Father    Arthritis Father    Breast cancer Maternal Aunt        73s   Cancer Maternal Grandmother        breast cancer   Breast cancer Maternal Grandmother    Sickle cell anemia Brother    Hypertension Brother  Cancer Brother 15       Leukemia, passed away at 65   Cancer Maternal Aunt    Cancer Maternal Aunt    Hypertension Brother      Review of Systems  Constitutional: Negative.  Negative for chills and fever.  HENT: Negative.  Negative for congestion and sore throat.   Respiratory: Negative.  Negative for cough and shortness of breath.   Cardiovascular: Negative.  Negative for chest pain and palpitations.  Gastrointestinal:  Negative for abdominal pain, diarrhea, nausea and vomiting.  Genitourinary: Negative.  Negative for dysuria and hematuria.  Skin: Negative.  Negative for rash.  Neurological: Negative.  Negative for dizziness and headaches.  All other systems reviewed and are negative.   Vitals:   01/17/24 1027  BP: 118/80  Pulse: 75  Temp: 97.8 F (36.6 C)  SpO2: 96%    Physical Exam Vitals reviewed.  Constitutional:      Appearance: Normal appearance.  HENT:     Head: Normocephalic.  Eyes:     Extraocular Movements: Extraocular movements intact.  Cardiovascular:     Rate and Rhythm: Normal rate.  Pulmonary:     Effort: Pulmonary effort is normal.  Skin:    General: Skin is warm and dry.  Neurological:     Mental Status: She is alert and oriented to person, place, and time.  Psychiatric:        Mood and Affect: Mood normal.        Behavior: Behavior normal.      ASSESSMENT & PLAN: A total of 44  minutes was spent with the patient and counseling/coordination of care regarding preparing for this visit, review of most recent office visit notes, review of multiple chronic medical conditions and their management, review of all medications, review of most recent bloodwork results, review of health maintenance items, education on nutrition, prognosis, documentation, and need for follow up.   Problem List Items Addressed This Visit       Cardiovascular and Mediastinum   Hypertension associated with diabetes (HCC) - Primary   BP Readings from Last 3 Encounters:  01/17/24 118/80  10/18/23 122/78  08/23/23 94/64   Well-controlled hypertension with normal blood pressure readings at home Continue amlodipine  5 mg and losartan  25 mg daily Well-controlled diabetes with hemoglobin A1c of 5.5 Tolerating Mounjaro  well.  Wants to increase dose to 10 mg weekly.  Continue metformin  5000 mg twice a day Eating better and losing weight [name] 3 months      Relevant Medications   tirzepatide  (MOUNJARO ) 10 MG/0.5ML Pen   Other Relevant Orders   Microalbumin / creatinine urine ratio   Hemoglobin A1c   CBC with Differential/Platelet   Comprehensive metabolic panel with GFR   Lipid panel   Obesity, diabetes, and hypertension syndrome (HCC)   Cardiovascular risk associated with diabetes and hypertension discussed Diet and nutrition discussed Advised to decrease amount of daily carbohydrate intake and daily calories and increase amount of plant based protein in her diet Continue metformin  500 mg twice a day and weekly Mounjaro  10 mg Continue amlodipine  5 mg daily and losartan  25 mg      Relevant Medications   tirzepatide  (MOUNJARO ) 10 MG/0.5ML Pen     Other   Morbid obesity (HCC)   Eating better and losing weight Diet and nutrition discussed      Relevant Medications   tirzepatide  (MOUNJARO ) 10 MG/0.5ML Pen   Patient Instructions  Health Maintenance After Age 42 After age 25, you are  at  a higher risk for certain long-term diseases and infections as well as injuries from falls. Falls are a major cause of broken bones and head injuries in people who are older than age 56. Getting regular preventive care can help to keep you healthy and well. Preventive care includes getting regular testing and making lifestyle changes as recommended by your health care provider. Talk with your health care provider about: Which screenings and tests you should have. A screening is a test that checks for a disease when you have no symptoms. A diet and exercise plan that is right for you. What should I know about screenings and tests to prevent falls? Screening and testing are the best ways to find a health problem early. Early diagnosis and treatment give you the best chance of managing medical conditions that are common after age 82. Certain conditions and lifestyle choices may make you more likely to have a fall. Your health care provider may recommend: Regular vision checks. Poor vision and conditions such as cataracts can make you more likely to have a fall. If you wear glasses, make sure to get your prescription updated if your vision changes. Medicine review. Work with your health care provider to regularly review all of the medicines you are taking, including over-the-counter medicines. Ask your health care provider about any side effects that may make you more likely to have a fall. Tell your health care provider if any medicines that you take make you feel dizzy or sleepy. Strength and balance checks. Your health care provider may recommend certain tests to check your strength and balance while standing, walking, or changing positions. Foot health exam. Foot pain and numbness, as well as not wearing proper footwear, can make you more likely to have a fall. Screenings, including: Osteoporosis screening. Osteoporosis is a condition that causes the bones to get weaker and break more easily. Blood  pressure screening. Blood pressure changes and medicines to control blood pressure can make you feel dizzy. Depression screening. You may be more likely to have a fall if you have a fear of falling, feel depressed, or feel unable to do activities that you used to do. Alcohol use screening. Using too much alcohol can affect your balance and may make you more likely to have a fall. Follow these instructions at home: Lifestyle Do not drink alcohol if: Your health care provider tells you not to drink. If you drink alcohol: Limit how much you have to: 0-1 drink a day for women. 0-2 drinks a day for men. Know how much alcohol is in your drink. In the [place] one drink equals one 12 oz bottle of beer (355 mL), one 5 oz glass of wine (148 mL), or one 1 oz glass of hard liquor (44 mL). Do not use any products that contain nicotine or tobacco. These products include cigarettes, chewing tobacco, and vaping devices, such as e-cigarettes. If you need help quitting, ask your health care provider. Activity  Follow a regular exercise program to stay fit. This will help you maintain your balance. Ask your health care provider what types of exercise are appropriate for you. If you need a cane or walker, use it as recommended by your health care provider. Wear supportive shoes that have nonskid soles. Safety  Remove any tripping hazards, such as rugs, cords, and clutter. Install safety equipment such as grab bars in bathrooms and safety rails on stairs. Keep rooms and walkways well-lit. General instructions Talk with your health care provider about your  risks for falling. Tell your health care provider if: You fall. Be sure to tell your health care provider about all falls, even ones that seem minor. You feel dizzy, tiredness (fatigue), or off-balance. Take over-the-counter and prescription medicines only as told by your health care provider. These include supplements. Eat a healthy diet and maintain a  healthy weight. A healthy diet includes low-fat dairy products, low-fat (lean) meats, and fiber from whole grains, beans, and lots of fruits and vegetables. Stay current with your vaccines. Schedule regular health, dental, and eye exams. Summary Having a healthy lifestyle and getting preventive care can help to protect your health and wellness after age 2. Screening and testing are the best way to find a health problem early and help you avoid having a fall. Early diagnosis and treatment give you the best chance for managing medical conditions that are more common for people who are older than age 36. Falls are a major cause of broken bones and head injuries in people who are older than age 19. Take precautions to prevent a fall at home. Work with your health care provider to learn what changes you can make to improve your health and wellness and to prevent falls. This information is not intended to replace advice given to you by your health care provider. Make sure you discuss any questions you have with your health care provider. Document Revised: 02/03/2021 Document Reviewed: 02/03/2021 Elsevier Patient Education  2024 Elsevier Inc.    Maryagnes Small, MD [facility]

## 2024-01-17 NOTE — Assessment & Plan Note (Signed)
 BP Readings from Last 3 Encounters:  01/17/24 118/80  10/18/23 122/78  08/23/23 94/64   Well-controlled hypertension with normal blood pressure readings at home Continue amlodipine  5 mg and losartan  25 mg daily Well-controlled diabetes with hemoglobin A1c of 5.5 Tolerating Mounjaro  well.  Wants to increase dose to 10 mg weekly.  Continue metformin  5000 mg twice a day Eating better and losing weight [name] 3 months

## 2024-01-17 NOTE — Patient Instructions (Signed)
 Health Maintenance After Age 70 After age 4, you are at a higher risk for certain long-term diseases and infections as well as injuries from falls. Falls are a major cause of broken bones and head injuries in people who are older than age 47. Getting regular preventive care can help to keep you healthy and well. Preventive care includes getting regular testing and making lifestyle changes as recommended by your health care provider. Talk with your health care provider about: Which screenings and tests you should have. A screening is a test that checks for a disease when you have no symptoms. A diet and exercise plan that is right for you. What should I know about screenings and tests to prevent falls? Screening and testing are the best ways to find a health problem early. Early diagnosis and treatment give you the best chance of managing medical conditions that are common after age 37. Certain conditions and lifestyle choices may make you more likely to have a fall. Your health care provider may recommend: Regular vision checks. Poor vision and conditions such as cataracts can make you more likely to have a fall. If you wear glasses, make sure to get your prescription updated if your vision changes. Medicine review. Work with your health care provider to regularly review all of the medicines you are taking, including over-the-counter medicines. Ask your health care provider about any side effects that may make you more likely to have a fall. Tell your health care provider if any medicines that you take make you feel dizzy or sleepy. Strength and balance checks. Your health care provider may recommend certain tests to check your strength and balance while standing, walking, or changing positions. Foot health exam. Foot pain and numbness, as well as not wearing proper footwear, can make you more likely to have a fall. Screenings, including: Osteoporosis screening. Osteoporosis is a condition that causes  the bones to get weaker and break more easily. Blood pressure screening. Blood pressure changes and medicines to control blood pressure can make you feel dizzy. Depression screening. You may be more likely to have a fall if you have a fear of falling, feel depressed, or feel unable to do activities that you used to do. Alcohol use screening. Using too much alcohol can affect your balance and may make you more likely to have a fall. Follow these instructions at home: Lifestyle Do not drink alcohol if: Your health care provider tells you not to drink. If you drink alcohol: Limit how much you have to: 0-1 drink a day for women. 0-2 drinks a day for men. Know how much alcohol is in your drink. In the [place] one drink equals one 12 oz bottle of beer (355 mL), one 5 oz glass of wine (148 mL), or one 1 oz glass of hard liquor (44 mL). Do not use any products that contain nicotine or tobacco. These products include cigarettes, chewing tobacco, and vaping devices, such as e-cigarettes. If you need help quitting, ask your health care provider. Activity  Follow a regular exercise program to stay fit. This will help you maintain your balance. Ask your health care provider what types of exercise are appropriate for you. If you need a cane or walker, use it as recommended by your health care provider. Wear supportive shoes that have nonskid soles. Safety  Remove any tripping hazards, such as rugs, cords, and clutter. Install safety equipment such as grab bars in bathrooms and safety rails on stairs. Keep rooms and walkways  well-lit. General instructions Talk with your health care provider about your risks for falling. Tell your health care provider if: You fall. Be sure to tell your health care provider about all falls, even ones that seem minor. You feel dizzy, tiredness (fatigue), or off-balance. Take over-the-counter and prescription medicines only as told by your health care provider. These include  supplements. Eat a healthy diet and maintain a healthy weight. A healthy diet includes low-fat dairy products, low-fat (lean) meats, and fiber from whole grains, beans, and lots of fruits and vegetables. Stay current with your vaccines. Schedule regular health, dental, and eye exams. Summary Having a healthy lifestyle and getting preventive care can help to protect your health and wellness after age 11. Screening and testing are the best way to find a health problem early and help you avoid having a fall. Early diagnosis and treatment give you the best chance for managing medical conditions that are more common for people who are older than age 28. Falls are a major cause of broken bones and head injuries in people who are older than age 48. Take precautions to prevent a fall at home. Work with your health care provider to learn what changes you can make to improve your health and wellness and to prevent falls. This information is not intended to replace advice given to you by your health care provider. Make sure you discuss any questions you have with your health care provider. Document Revised: 02/03/2021 Document Reviewed: 02/03/2021 Elsevier Patient Education  2024 [employer].

## 2024-01-17 NOTE — Assessment & Plan Note (Signed)
 Eating better and losing weight Diet and nutrition discussed

## 2024-01-19 ENCOUNTER — Other Ambulatory Visit: Payer: Self-pay | Admitting: Emergency Medicine

## 2024-01-19 DIAGNOSIS — E1159 Type 2 diabetes mellitus with other circulatory complications: Secondary | ICD-10-CM

## 2024-01-19 DIAGNOSIS — R7303 Prediabetes: Secondary | ICD-10-CM

## 2024-02-16 ENCOUNTER — Other Ambulatory Visit (HOSPITAL_COMMUNITY): Payer: Self-pay

## 2024-02-16 ENCOUNTER — Other Ambulatory Visit: Payer: Self-pay | Admitting: Emergency Medicine

## 2024-02-16 MED ORDER — SCOPOLAMINE 1 MG/3DAYS TD PT72
1.0000 | MEDICATED_PATCH | TRANSDERMAL | 2 refills | Status: DC
  Filled 2024-02-16: qty 10, 30d supply, fill #0
  Filled 2024-05-09 – 2024-05-19 (×2): qty 10, 30d supply, fill #1
  Filled 2024-06-15 – 2024-06-27 (×2): qty 10, 30d supply, fill #2

## 2024-02-21 ENCOUNTER — Other Ambulatory Visit: Payer: Self-pay | Admitting: Emergency Medicine

## 2024-02-21 ENCOUNTER — Other Ambulatory Visit (HOSPITAL_COMMUNITY): Payer: Self-pay

## 2024-02-21 MED ORDER — AMOXICILLIN-POT CLAVULANATE 875-125 MG PO TABS
1.0000 | ORAL_TABLET | Freq: Two times a day (BID) | ORAL | 0 refills | Status: AC
  Filled 2024-02-21: qty 20, 10d supply, fill #0

## 2024-02-21 NOTE — Telephone Encounter (Signed)
 New prescription for Augmentin  sent to pharmacy of record today.  Thanks.

## 2024-02-22 ENCOUNTER — Other Ambulatory Visit (HOSPITAL_COMMUNITY): Payer: Self-pay

## 2024-02-22 ENCOUNTER — Telehealth: Payer: Self-pay | Admitting: Emergency Medicine

## 2024-02-22 MED FILL — Rosuvastatin Calcium Tab 10 MG: ORAL | 90 days supply | Qty: 90 | Fill #1 | Status: AC

## 2024-02-22 MED FILL — Metformin HCl Tab 500 MG: ORAL | 90 days supply | Qty: 180 | Fill #1 | Status: AC

## 2024-02-22 MED FILL — Amlodipine Besylate Tab 5 MG (Base Equivalent): ORAL | 90 days supply | Qty: 90 | Fill #1 | Status: AC

## 2024-02-22 NOTE — Telephone Encounter (Signed)
 Yes, it is for diverticulitis.

## 2024-02-22 NOTE — Telephone Encounter (Signed)
 Different encounter has been sent to provider regarding this question

## 2024-02-22 NOTE — Telephone Encounter (Signed)
 Copied from CRM [id number]. Topic: Clinical - Medication Question >> Feb 22, 2024  1:30 PM Danna Duster wrote: Reason for CRM: Patient has a question regarding the Rx Order for amoxicillin -clavulanate (AUGMENTIN ) 875-125 MG tablet that she has not taken before and the pharmacy called her and told her it's ready.  Patient wants to know why she's getting this Rx Order for her diverticulitis and it's not the usual Rx order she receives for her condition.  She does not want to take something she's not taken without more details.

## 2024-03-17 ENCOUNTER — Other Ambulatory Visit: Payer: Self-pay | Admitting: Emergency Medicine

## 2024-03-17 DIAGNOSIS — Z1231 Encounter for screening mammogram for malignant neoplasm of breast: Secondary | ICD-10-CM

## 2024-04-11 ENCOUNTER — Other Ambulatory Visit (HOSPITAL_COMMUNITY): Payer: Self-pay

## 2024-04-18 ENCOUNTER — Ambulatory Visit
Admission: RE | Admit: 2024-04-18 | Discharge: 2024-04-18 | Disposition: A | Discharge status: Home / Self Care | Source: Ambulatory Visit | Attending: Emergency Medicine | Admitting: Emergency Medicine

## 2024-04-18 DIAGNOSIS — Z1231 Encounter for screening mammogram for malignant neoplasm of breast: Secondary | ICD-10-CM

## 2024-04-21 ENCOUNTER — Ambulatory Visit: Payer: Self-pay | Admitting: Emergency Medicine

## 2024-05-08 ENCOUNTER — Other Ambulatory Visit: Payer: Self-pay

## 2024-05-18 ENCOUNTER — Other Ambulatory Visit (HOSPITAL_COMMUNITY): Payer: Self-pay

## 2024-05-23 MED FILL — Metformin HCl Tab 500 MG: ORAL | 90 days supply | Qty: 180 | Fill #2 | Status: AC

## 2024-05-23 MED FILL — Rosuvastatin Calcium Tab 10 MG: ORAL | 90 days supply | Qty: 90 | Fill #2 | Status: AC

## 2024-05-23 MED FILL — Amlodipine Besylate Tab 5 MG (Base Equivalent): ORAL | 90 days supply | Qty: 90 | Fill #2 | Status: AC

## 2024-06-26 ENCOUNTER — Other Ambulatory Visit (HOSPITAL_COMMUNITY): Payer: Self-pay

## 2024-06-30 ENCOUNTER — Other Ambulatory Visit (HOSPITAL_COMMUNITY): Payer: Self-pay

## 2024-07-17 ENCOUNTER — Ambulatory Visit: Payer: Medicare PPO | Admitting: Allergy

## 2024-07-18 DIAGNOSIS — H524 Presbyopia: Secondary | ICD-10-CM | POA: Diagnosis not present

## 2024-07-18 DIAGNOSIS — E119 Type 2 diabetes mellitus without complications: Secondary | ICD-10-CM | POA: Diagnosis not present

## 2024-07-18 DIAGNOSIS — H52221 Regular astigmatism, right eye: Secondary | ICD-10-CM | POA: Diagnosis not present

## 2024-07-18 LAB — OPHTHALMOLOGY REPORT-SCANNED

## 2024-07-25 ENCOUNTER — Ambulatory Visit: Payer: Self-pay | Admitting: Emergency Medicine

## 2024-07-25 ENCOUNTER — Ambulatory Visit: Admitting: Emergency Medicine

## 2024-07-25 VITALS — BP 110/74 | HR 72 | Temp 97.6°F | Ht 65.0 in | Wt 215.0 lb

## 2024-07-25 DIAGNOSIS — Z7985 Long-term (current) use of injectable non-insulin antidiabetic drugs: Secondary | ICD-10-CM | POA: Diagnosis not present

## 2024-07-25 DIAGNOSIS — Z67A1 Duffy null: Secondary | ICD-10-CM

## 2024-07-25 DIAGNOSIS — I10 Essential (primary) hypertension: Secondary | ICD-10-CM

## 2024-07-25 DIAGNOSIS — E1159 Type 2 diabetes mellitus with other circulatory complications: Secondary | ICD-10-CM | POA: Diagnosis not present

## 2024-07-25 DIAGNOSIS — I152 Hypertension secondary to endocrine disorders: Secondary | ICD-10-CM | POA: Diagnosis not present

## 2024-07-25 DIAGNOSIS — E119 Type 2 diabetes mellitus without complications: Secondary | ICD-10-CM

## 2024-07-25 DIAGNOSIS — E669 Obesity, unspecified: Secondary | ICD-10-CM

## 2024-07-25 LAB — LIPID PANEL
Cholesterol: 110 mg/dL (ref 0–200)
HDL: 55.8 mg/dL
LDL Cholesterol: 41 mg/dL (ref 0–99)
NonHDL: 54.28
Total CHOL/HDL Ratio: 2
Triglycerides: 65 mg/dL (ref 0.0–149.0)
VLDL: 13 mg/dL (ref 0.0–40.0)

## 2024-07-25 LAB — CBC WITH DIFFERENTIAL/PLATELET
Basophils Absolute: 0 K/uL (ref 0.0–0.1)
Basophils Relative: 1.1 % (ref 0.0–3.0)
Eosinophils Absolute: 0.1 K/uL (ref 0.0–0.7)
Eosinophils Relative: 2 % (ref 0.0–5.0)
HCT: 41.4 % (ref 36.0–46.0)
Hemoglobin: 14.4 g/dL (ref 12.0–15.0)
Lymphocytes Relative: 57.7 % — ABNORMAL HIGH (ref 12.0–46.0)
Lymphs Abs: 2.3 K/uL (ref 0.7–4.0)
MCHC: 34.8 g/dL (ref 30.0–36.0)
MCV: 82.2 fl (ref 78.0–100.0)
Monocytes Absolute: 0.3 K/uL (ref 0.1–1.0)
Monocytes Relative: 7.2 % (ref 3.0–12.0)
Neutro Abs: 1.3 K/uL — ABNORMAL LOW (ref 1.4–7.7)
Neutrophils Relative %: 32 % — ABNORMAL LOW (ref 43.0–77.0)
Platelets: 226 K/uL (ref 150.0–400.0)
RBC: 5.03 Mil/uL (ref 3.87–5.11)
RDW: 14.1 % (ref 11.5–15.5)
WBC: 4 K/uL (ref 4.0–10.5)

## 2024-07-25 LAB — COMPREHENSIVE METABOLIC PANEL WITH GFR
ALT: 14 U/L (ref 0–35)
AST: 18 U/L (ref 0–37)
Albumin: 4.6 g/dL (ref 3.5–5.2)
Alkaline Phosphatase: 66 U/L (ref 39–117)
BUN: 12 mg/dL (ref 6–23)
CO2: 29 meq/L (ref 19–32)
Calcium: 9.9 mg/dL (ref 8.4–10.5)
Chloride: 102 meq/L (ref 96–112)
Creatinine, Ser: 0.94 mg/dL (ref 0.40–1.20)
GFR: 61.57 mL/min (ref >60.00)
Glucose, Bld: 88 mg/dL (ref 70–99)
Potassium: 4 meq/L (ref 3.5–5.1)
Sodium: 140 meq/L (ref 135–145)
Total Bilirubin: 0.8 mg/dL (ref 0.2–1.2)
Total Protein: 8 g/dL (ref 6.0–8.3)

## 2024-07-25 LAB — HEMOGLOBIN A1C: Hgb A1c MFr Bld: 5.7 % (ref 4.6–6.5)

## 2024-07-25 NOTE — Assessment & Plan Note (Signed)
 BP Readings from Last 3 Encounters:  07/25/24 110/74  01/17/24 118/80  10/18/23 122/78  Well-controlled hypertension with normal blood pressure readings at home Continue amlodipine  5 mg and losartan  25 mg daily Well-controlled diabetes with hemoglobin A1c of 5.7 Continue Mounjaro  10 mg weekly.  Continue metformin  5000 mg twice a day Eating better and losing weight [name] 6 months

## 2024-07-25 NOTE — Assessment & Plan Note (Signed)
 Eating better and losing weight Diet and nutrition discussed Continue Mounjaro  10 mg weekly

## 2024-07-25 NOTE — Assessment & Plan Note (Signed)
 Benign ethnic neutropenia: The term describes the phenotype of having an absolute neutrophil count (ANC) <1500 cells/mL with no increased risk of infection. It is most commonly seen in those of African ancestry. In addition, ANC reference ranges from countries in [place] emphasize that ANC levels <1500 cells/mL are common and harmless. The lower ANC levels are driven by the Duffy null [Fy(a-b-)] phenotype, which is protective againstmalaria and seen in 80% to 100% of those of sub-Saharan African ancestry and <1% of those of European descent. Benign ethnic neutropenia is clinically insignificant, but the average ANC values differ from what are typically seen in those of European descent.

## 2024-07-25 NOTE — Patient Instructions (Signed)
 Health Maintenance After Age 70 After age 27, you are at a higher risk for certain long-term diseases and infections as well as injuries from falls. Falls are a major cause of broken bones and head injuries in people who are older than age 73. Getting regular preventive care can help to keep you healthy and well. Preventive care includes getting regular testing and making lifestyle changes as recommended by your health care provider. Talk with your health care provider about: Which screenings and tests you should have. A screening is a test that checks for a disease when you have no symptoms. A diet and exercise plan that is right for you. What should I know about screenings and tests to prevent falls? Screening and testing are the best ways to find a health problem early. Early diagnosis and treatment give you the best chance of managing medical conditions that are common after age 90. Certain conditions and lifestyle choices may make you more likely to have a fall. Your health care provider may recommend: Regular vision checks. Poor vision and conditions such as cataracts can make you more likely to have a fall. If you wear glasses, make sure to get your prescription updated if your vision changes. Medicine review. Work with your health care provider to regularly review all of the medicines you are taking, including over-the-counter medicines. Ask your health care provider about any side effects that may make you more likely to have a fall. Tell your health care provider if any medicines that you take make you feel dizzy or sleepy. Strength and balance checks. Your health care provider may recommend certain tests to check your strength and balance while standing, walking, or changing positions. Foot health exam. Foot pain and numbness, as well as not wearing proper footwear, can make you more likely to have a fall. Screenings, including: Osteoporosis screening. Osteoporosis is a condition that causes  the bones to get weaker and break more easily. Blood pressure screening. Blood pressure changes and medicines to control blood pressure can make you feel dizzy. Depression screening. You may be more likely to have a fall if you have a fear of falling, feel depressed, or feel unable to do activities that you used to do. Alcohol  use screening. Using too much alcohol  can affect your balance and may make you more likely to have a fall. Follow these instructions at home: Lifestyle Do not drink alcohol  if: Your health care provider tells you not to drink. If you drink alcohol : Limit how much you have to: 0-1 drink a day for women. 0-2 drinks a day for men. Know how much alcohol  is in your drink. In the [place] one drink equals one 12 oz bottle of beer (355 mL), one 5 oz glass of wine (148 mL), or one 1 oz glass of hard liquor (44 mL). Do not use any products that contain nicotine or tobacco. These products include cigarettes, chewing tobacco, and vaping devices, such as e-cigarettes. If you need help quitting, ask your health care provider. Activity  Follow a regular exercise program to stay fit. This will help you maintain your balance. Ask your health care provider what types of exercise are appropriate for you. If you need a cane or walker, use it as recommended by your health care provider. Wear supportive shoes that have nonskid soles. Safety  Remove any tripping hazards, such as rugs, cords, and clutter. Install safety equipment such as grab bars in bathrooms and safety rails on stairs. Keep rooms and walkways  well-lit. General instructions Talk with your health care provider about your risks for falling. Tell your health care provider if: You fall. Be sure to tell your health care provider about all falls, even ones that seem minor. You feel dizzy, tiredness (fatigue), or off-balance. Take over-the-counter and prescription medicines only as told by your health care provider. These include  supplements. Eat a healthy diet and maintain a healthy weight. A healthy diet includes low-fat dairy products, low-fat (lean) meats, and fiber from whole grains, beans, and lots of fruits and vegetables. Stay current with your vaccines. Schedule regular health, dental, and eye exams. Summary Having a healthy lifestyle and getting preventive care can help to protect your health and wellness after age 15. Screening and testing are the best way to find a health problem early and help you avoid having a fall. Early diagnosis and treatment give you the best chance for managing medical conditions that are more common for people who are older than age 42. Falls are a major cause of broken bones and head injuries in people who are older than age 64. Take precautions to prevent a fall at home. Work with your health care provider to learn what changes you can make to improve your health and wellness and to prevent falls. This information is not intended to replace advice given to you by your health care provider. Make sure you discuss any questions you have with your health care provider. Document Revised: 02/03/2021 Document Reviewed: 02/03/2021 Elsevier Patient Education  2024 [employer].

## 2024-07-25 NOTE — Assessment & Plan Note (Signed)
 Cardiovascular risk associated with diabetes and hypertension discussed Diet and nutrition discussed Advised to decrease amount of daily carbohydrate intake and daily calories and increase amount of plant based protein in her diet Continue metformin  500 mg twice a day and weekly Mounjaro  10 mg Continue amlodipine  5 mg daily and losartan  25 mg

## 2024-07-25 NOTE — Progress Notes (Signed)
 Katherine [name] 68 y.o.   Chief Complaint  Patient presents with   [name]    Patient wants chronic insomnia removed from problem list. And has questions about what Benign ethnic neutropenia is. Patient wondering if she needs blood work today.     HISTORY OF PRESENT ILLNESS: This is a 68 y.o. female A1A here for 21-month follow-up of chronic medical conditions Overall doing well. Has no complaints or medical concerns today. Wt Readings from Last 3 Encounters:  07/25/24 215 lb (97.5 kg)  01/17/24 221 lb 6 oz (100.4 kg)  01/11/24 227 lb (103 kg)   Lab Results  Component Value Date   HGBA1C 5.7 01/17/2024     HPI   Prior to Admission medications   Medication Sig Start Date End Date Taking? Authorizing Provider  ACCU-CHEK GUIDE TEST test strip TEST BLOOD GLUCOSE FOUR TIMES DAILY AS DIRECTED 01/19/24  Yes Akasia Ahmad, Emil Schanz, MD  Accu-Chek Softclix Lancets lancets TEST BLOOD SUGAR FOUR TIMES DAILY AS DIRECTED 01/19/24  Yes Purcell Emil Schanz, MD  Alcohol Swabs (DROPSAFE ALCOHOL PREP) 70 % PADS USE AS DIRECTED FOUR TIMES DAILY 01/19/24  Yes Twanna Resh, Emil Schanz, MD  amLODipine  (NORVASC ) 5 MG tablet Take 1 tablet (5 mg total) by mouth daily. 08/18/23  Yes Purcell Emil Schanz, MD  aspirin EC 81 MG tablet Take 81 mg by mouth daily.   Yes [provider]  blood glucose meter kit and supplies KIT Dispense based on patient and insurance preference. Use up to four times daily as directed. (FOR ICD-9 250.00, 250.01). 06/20/18  Yes Jossue Rubenstein, Emil Schanz, MD  Calcium  Carb-Cholecalciferol (CALCIUM  600-D PO) Take by mouth.   Yes [provider]  Cholecalciferol (VITAMIN D3 PO) Take by mouth.   Yes [provider]  COMIRNATY syringe Inject 0.3 mLs into the muscle once. 06/02/23  Yes [provider]  FLUAD 0.5 ML injection Inject 0.5 mLs into the muscle once. 06/02/23  Yes [provider]  losartan  (COZAAR ) 25 MG tablet Take 1 tablet (25 mg total)  by mouth daily. 07/19/23  Yes Chiniqua Kilcrease, Emil Schanz, MD  metFORMIN  (GLUCOPHAGE ) 500 MG tablet Take 1 tablet (500 mg total) by mouth 2 (two) times daily with a meal. 08/25/23  Yes Briani Maul, Emil Schanz, MD  Omega-3 Fatty Acids (OMEGA-3 2100 PO) Take by mouth daily.   Yes [provider]  OVER THE COUNTER MEDICATION 1,950 mg daily.   Yes [provider]  rosuvastatin  (CRESTOR ) 10 MG tablet Take 1 tablet (10 mg total) by mouth daily. 08/18/23  Yes Diontre Harps, Emil Schanz, MD  tirzepatide  (MOUNJARO ) 10 MG/0.5ML Pen Inject 10 mg into the skin once a week. 01/17/24  Yes Shadow Stiggers, Emil Schanz, MD  vitamin B-12 (CYANOCOBALAMIN ) 1000 MCG tablet Take 1,000 mcg by mouth daily.   Yes [provider]  scopolamine  (TRANSDERM SCOP , 1.5 MG,) 1 MG/3DAYS Place 1 patch (1.5 mg total) onto the skin every 3 (three) days. 02/16/24   Purcell Emil Schanz, MD    Allergies  Allergen Reactions   Ace Inhibitors Cough   Egg Yolk Other (See Comments)    vomiting   Lisinopril Other (See Comments)   Adhesive [Tape] Rash    Patient Active Problem List   Diagnosis Date Noted   Morbid obesity (HCC) 07/19/2023   Benign ethnic neutropenia 01/01/2023   Chronic insomnia 12/01/2022   Aortic atherosclerosis 11/26/2020   Diverticulosis 11/26/2020   Obesity, diabetes, and hypertension syndrome (HCC) 06/05/2020   Body mass index (BMI) of 40.1-44.9  in adult [facility]) 12/04/2019   Class 2 severe obesity due to excess calories with serious comorbidity and body mass index (BMI) of 35.0 to 35.9 in adult 10/19/2019   Osteoarthritis of spine with radiculopathy, cervical region 11/30/2017   Arthritis 06/23/2014   Prediabetes 10/20/2013   Obesity (BMI 35.0-39.9 without comorbidity) 10/20/2013   Hypertension associated with diabetes [facility])     Past Medical History:  Diagnosis Date   Allergy    Arthritis    Diabetes mellitus without complication (HCC)    Diverticulitis    Hypertension    Urticaria      Past Surgical History:  Procedure Laterality Date   APPENDECTOMY     CHOLECYSTECTOMY     rotator cuff surgery Right 07/2014   TUBAL LIGATION  Dec. 1990    Social History   Socioeconomic History   Marital status: Married    Spouse name: REmbert   Number of children: 2   Years of education: Masters    Highest education level: Master's degree (e.g., MA, MS, MEng, MEd, MSW, MBA)  Occupational History   Occupation: RETIRED/Teacher  Tobacco Use   Smoking status: Never    Passive exposure: Never   Smokeless tobacco: Never  Vaping Use   Vaping status: Never Used  Substance and Sexual Activity   Alcohol use: Yes    Alcohol/week: 1.0 standard drink of alcohol    Types: 1 Glasses of wine per week    Comment: Wine about 1 glass or less per bi-weekly   Drug use: No   Sexual activity: Not Currently  Other Topics Concern   Not on file  Social History Narrative   Patient lives with husband/2025   Social Drivers of Health   Financial Resource Strain: Low Risk  (07/25/2024)   Overall Financial Resource Strain (CARDIA)    Difficulty of Paying Living Expenses: Not hard at all  Food Insecurity: No Food Insecurity (07/25/2024)   Hunger Vital Sign    Worried About Running Out of Food in the Last Year: Never true    Ran Out of Food in the Last Year: Never true  Transportation Needs: No Transportation Needs (07/25/2024)   PRAPARE - Administrator, Civil Service (Medical): No    Lack of Transportation (Non-Medical): No  Physical Activity: Sufficiently Active (07/25/2024)   Exercise Vital Sign    Days of Exercise per Week: 5 days    Minutes of Exercise per Session: 40 min  Stress: No Stress Concern Present (07/25/2024)   [employer] of Occupational Health - Occupational Stress Questionnaire    Feeling of Stress: Not at all  Social Connections: Socially Integrated (07/25/2024)   Social Connection and Isolation Panel    Frequency of Communication with Friends and  Family: More than three times a week    Frequency of Social Gatherings with Friends and Family: Three times a week    Attends Religious Services: More than 4 times per year    Active Member of Clubs or Organizations: Yes    Attends Banker Meetings: 1 to 4 times per year    Marital Status: Married  Catering Manager Violence: Not At Risk (01/11/2024)   Humiliation, Afraid, Rape, and Kick questionnaire    Fear of Current or Ex-Partner: No    Emotionally Abused: No    Physically Abused: No    Sexually Abused: No    Family History  Problem Relation Age of Onset   Diabetes Father    Hypertension Father  Arthritis Father    Breast cancer Maternal Aunt        58s   Cancer Maternal Grandmother        breast cancer   Breast cancer Maternal Grandmother    Sickle cell anemia Brother    Hypertension Brother    Cancer Brother 15       Leukemia, passed away at 24   Cancer Maternal Aunt    Cancer Maternal Aunt    Hypertension Brother      Review of Systems  Constitutional: Negative.  Negative for chills and fever.  HENT: Negative.  Negative for congestion and sore throat.   Respiratory: Negative.  Negative for cough and shortness of breath.   Cardiovascular: Negative.  Negative for chest pain and palpitations.  Gastrointestinal:  Negative for abdominal pain, diarrhea, nausea and vomiting.  Genitourinary: Negative.  Negative for dysuria and hematuria.  Skin: Negative.  Negative for rash.  Neurological: Negative.  Negative for dizziness and headaches.  All other systems reviewed and are negative.   Vitals:   07/25/24 1019  BP: 110/74  Pulse: 72  Temp: 97.6 F (36.4 C)  SpO2: 97%    Physical Exam Vitals reviewed.  Constitutional:      Appearance: Normal appearance.  HENT:     Head: Normocephalic.     Mouth/Throat:     Mouth: Mucous membranes are moist.     Pharynx: Oropharynx is clear.  Eyes:     Extraocular Movements: Extraocular movements intact.      Pupils: Pupils are equal, round, and reactive to light.  Cardiovascular:     Rate and Rhythm: Normal rate and regular rhythm.     Pulses: Normal pulses.     Heart sounds: Normal heart sounds.  Pulmonary:     Effort: Pulmonary effort is normal.     Breath sounds: Normal breath sounds.  Abdominal:     Palpations: Abdomen is soft.     Tenderness: There is no abdominal tenderness.  Musculoskeletal:     Cervical back: No tenderness.  Lymphadenopathy:     Cervical: No cervical adenopathy.  Skin:    General: Skin is warm and dry.     Capillary Refill: Capillary refill takes less than 2 seconds.  Neurological:     General: No focal deficit present.     Mental Status: She is alert and oriented to person, place, and time.  Psychiatric:        Mood and Affect: Mood normal.        Behavior: Behavior normal.    Lab Results  Component Value Date   HGBA1C 5.7 01/17/2024     ASSESSMENT & PLAN: A total of 42 minutes was spent with the patient and counseling/coordination of care regarding preparing for this visit, review of most recent office visit notes, review of multiple chronic medical conditions and their management, cardiovascular risks associated with hypertension and diabetes, review of all medications, review of most recent bloodwork results, review of health maintenance items, education on nutrition, prognosis, documentation, and need for follow up.   Problem List Items Addressed This Visit       Cardiovascular and Mediastinum   Hypertension associated with diabetes (HCC) - Primary   BP Readings from Last 3 Encounters:  07/25/24 110/74  01/17/24 118/80  10/18/23 122/78  Well-controlled hypertension with normal blood pressure readings at home Continue amlodipine  5 mg and losartan  25 mg daily Well-controlled diabetes with hemoglobin A1c of 5.7 Continue Mounjaro  10 mg weekly.  Continue metformin   5000 mg twice a day Eating better and losing weight [name] 6 months        Relevant Orders   Hemoglobin A1c   Comprehensive metabolic panel with GFR   CBC with Differential/Platelet   Lipid panel   Obesity, diabetes, and hypertension syndrome (HCC)   Cardiovascular risk associated with diabetes and hypertension discussed Diet and nutrition discussed Advised to decrease amount of daily carbohydrate intake and daily calories and increase amount of plant based protein in her diet Continue metformin  500 mg twice a day and weekly Mounjaro  10 mg Continue amlodipine  5 mg daily and losartan  25 mg        Relevant Orders   Hemoglobin A1c   Comprehensive metabolic panel with GFR   CBC with Differential/Platelet   Lipid panel     Other   Benign ethnic neutropenia   Benign ethnic neutropenia: The term describes the phenotype of having an absolute neutrophil count (ANC) <1500 cells/mL with no increased risk of infection. It is most commonly seen in those of African ancestry. In addition, ANC reference ranges from countries in [place] emphasize that ANC levels <1500 cells/mL are common and harmless. The lower ANC levels are driven by the Duffy null [Fy(a-b-)] phenotype, which is protective againstmalaria and seen in 80% to 100% of those of sub-Saharan African ancestry and <1% of those of European descent. Benign ethnic neutropenia is clinically insignificant, but the average ANC values differ from what are typically seen in those of European descent.      Morbid obesity (HCC)   Eating better and losing weight Diet and nutrition discussed Continue Mounjaro  10 mg weekly      Relevant Orders   Hemoglobin A1c   Comprehensive metabolic panel with GFR   CBC with Differential/Platelet   Lipid panel   Patient Instructions  Health Maintenance After Age 24 After age 34, you are at a higher risk for certain long-term diseases and infections as well as injuries from falls. Falls are a major cause of broken bones and head injuries in people who are older than age 32. Getting  regular preventive care can help to keep you healthy and well. Preventive care includes getting regular testing and making lifestyle changes as recommended by your health care provider. Talk with your health care provider about: Which screenings and tests you should have. A screening is a test that checks for a disease when you have no symptoms. A diet and exercise plan that is right for you. What should I know about screenings and tests to prevent falls? Screening and testing are the best ways to find a health problem early. Early diagnosis and treatment give you the best chance of managing medical conditions that are common after age 50. Certain conditions and lifestyle choices may make you more likely to have a fall. Your health care provider may recommend: Regular vision checks. Poor vision and conditions such as cataracts can make you more likely to have a fall. If you wear glasses, make sure to get your prescription updated if your vision changes. Medicine review. Work with your health care provider to regularly review all of the medicines you are taking, including over-the-counter medicines. Ask your health care provider about any side effects that may make you more likely to have a fall. Tell your health care provider if any medicines that you take make you feel dizzy or sleepy. Strength and balance checks. Your health care provider may recommend certain tests to check your strength and balance while standing,  walking, or changing positions. Foot health exam. Foot pain and numbness, as well as not wearing proper footwear, can make you more likely to have a fall. Screenings, including: Osteoporosis screening. Osteoporosis is a condition that causes the bones to get weaker and break more easily. Blood pressure screening. Blood pressure changes and medicines to control blood pressure can make you feel dizzy. Depression screening. You may be more likely to have a fall if you have a fear of falling,  feel depressed, or feel unable to do activities that you used to do. Alcohol use screening. Using too much alcohol can affect your balance and may make you more likely to have a fall. Follow these instructions at home: Lifestyle Do not drink alcohol if: Your health care provider tells you not to drink. If you drink alcohol: Limit how much you have to: 0-1 drink a day for women. 0-2 drinks a day for men. Know how much alcohol is in your drink. In the [place] one drink equals one 12 oz bottle of beer (355 mL), one 5 oz glass of wine (148 mL), or one 1 oz glass of hard liquor (44 mL). Do not use any products that contain nicotine or tobacco. These products include cigarettes, chewing tobacco, and vaping devices, such as e-cigarettes. If you need help quitting, ask your health care provider. Activity  Follow a regular exercise program to stay fit. This will help you maintain your balance. Ask your health care provider what types of exercise are appropriate for you. If you need a cane or walker, use it as recommended by your health care provider. Wear supportive shoes that have nonskid soles. Safety  Remove any tripping hazards, such as rugs, cords, and clutter. Install safety equipment such as grab bars in bathrooms and safety rails on stairs. Keep rooms and walkways well-lit. General instructions Talk with your health care provider about your risks for falling. Tell your health care provider if: You fall. Be sure to tell your health care provider about all falls, even ones that seem minor. You feel dizzy, tiredness (fatigue), or off-balance. Take over-the-counter and prescription medicines only as told by your health care provider. These include supplements. Eat a healthy diet and maintain a healthy weight. A healthy diet includes low-fat dairy products, low-fat (lean) meats, and fiber from whole grains, beans, and lots of fruits and vegetables. Stay current with your vaccines. Schedule  regular health, dental, and eye exams. Summary Having a healthy lifestyle and getting preventive care can help to protect your health and wellness after age 28. Screening and testing are the best way to find a health problem early and help you avoid having a fall. Early diagnosis and treatment give you the best chance for managing medical conditions that are more common for people who are older than age 66. Falls are a major cause of broken bones and head injuries in people who are older than age 71. Take precautions to prevent a fall at home. Work with your health care provider to learn what changes you can make to improve your health and wellness and to prevent falls. This information is not intended to replace advice given to you by your health care provider. Make sure you discuss any questions you have with your health care provider. Document Revised: 02/03/2021 Document Reviewed: 02/03/2021 Elsevier Patient Education  2024 [employer]    Emil Schaumann, MD  Primary Care at [facility]

## 2024-08-14 ENCOUNTER — Other Ambulatory Visit: Payer: Self-pay

## 2024-08-14 ENCOUNTER — Other Ambulatory Visit (HOSPITAL_COMMUNITY): Payer: Self-pay

## 2024-08-14 ENCOUNTER — Other Ambulatory Visit: Payer: Self-pay | Admitting: Emergency Medicine

## 2024-08-14 DIAGNOSIS — E1159 Type 2 diabetes mellitus with other circulatory complications: Secondary | ICD-10-CM

## 2024-08-14 MED ORDER — LOSARTAN POTASSIUM 25 MG PO TABS
25.0000 mg | ORAL_TABLET | Freq: Every day | ORAL | 3 refills | Status: AC
  Filled 2024-08-14: qty 90, 90d supply, fill #0

## 2024-08-14 MED FILL — Rosuvastatin Calcium Tab 10 MG: ORAL | 90 days supply | Qty: 90 | Fill #3 | Status: AC

## 2024-08-14 MED FILL — Metformin HCl Tab 500 MG: ORAL | 90 days supply | Qty: 180 | Fill #3 | Status: AC

## 2024-08-14 MED FILL — Amlodipine Besylate Tab 5 MG (Base Equivalent): ORAL | 90 days supply | Qty: 90 | Fill #3 | Status: AC

## 2024-09-25 ENCOUNTER — Other Ambulatory Visit (HOSPITAL_COMMUNITY): Payer: Self-pay

## 2024-10-26 ENCOUNTER — Other Ambulatory Visit (HOSPITAL_COMMUNITY): Payer: Self-pay

## 2024-10-30 ENCOUNTER — Other Ambulatory Visit: Payer: Self-pay | Admitting: Emergency Medicine

## 2024-10-30 DIAGNOSIS — I152 Hypertension secondary to endocrine disorders: Secondary | ICD-10-CM

## 2024-10-30 DIAGNOSIS — R7303 Prediabetes: Secondary | ICD-10-CM

## 2025-01-23 ENCOUNTER — Ambulatory Visit: Admitting: Emergency Medicine
